# Patient Record
Sex: Male | Born: 1950
Health system: Southern US, Community
[De-identification: ages and names within clinical notes are randomized; demographics above are authoritative.]

## PROBLEM LIST (undated history)

## (undated) DIAGNOSIS — F419 Anxiety disorder, unspecified: Secondary | ICD-10-CM

## (undated) DIAGNOSIS — K635 Polyp of colon: Secondary | ICD-10-CM

## (undated) DIAGNOSIS — E1140 Type 2 diabetes mellitus with diabetic neuropathy, unspecified: Secondary | ICD-10-CM

## (undated) DIAGNOSIS — E119 Type 2 diabetes mellitus without complications: Secondary | ICD-10-CM

## (undated) DIAGNOSIS — E220 Acromegaly and pituitary gigantism: Secondary | ICD-10-CM

## (undated) DIAGNOSIS — M545 Low back pain, unspecified: Secondary | ICD-10-CM

## (undated) DIAGNOSIS — M419 Scoliosis, unspecified: Secondary | ICD-10-CM

## (undated) DIAGNOSIS — S82841A Displaced bimalleolar fracture of right lower leg, initial encounter for closed fracture: Secondary | ICD-10-CM

## (undated) DIAGNOSIS — Z800 Family history of malignant neoplasm of digestive organs: Secondary | ICD-10-CM

## (undated) DIAGNOSIS — E785 Hyperlipidemia, unspecified: Secondary | ICD-10-CM

## (undated) DIAGNOSIS — M5431 Sciatica, right side: Secondary | ICD-10-CM

## (undated) DIAGNOSIS — M47816 Spondylosis without myelopathy or radiculopathy, lumbar region: Secondary | ICD-10-CM

## (undated) DIAGNOSIS — D352 Benign neoplasm of pituitary gland: Secondary | ICD-10-CM

## (undated) DIAGNOSIS — K219 Gastro-esophageal reflux disease without esophagitis: Secondary | ICD-10-CM

## (undated) HISTORY — PX: ESOPHAGOGASTRODUODENOSCOPY: SHX1529

## (undated) HISTORY — DX: Polyp of colon: K63.5

## (undated) HISTORY — PX: FRACTURE SURGERY: SHX138

## (undated) HISTORY — PX: TONSILLECTOMY: SUR1361

## (undated) HISTORY — DX: Spondylosis without myelopathy or radiculopathy, lumbar region: M47.816

## (undated) HISTORY — PX: POLYPECTOMY: SHX149

## (undated) HISTORY — DX: Acromegaly and pituitary gigantism: E22.0

## (undated) HISTORY — PX: HEMORRHOID SURGERY: SHX153

## (undated) HISTORY — DX: Sciatica, right side: M54.31

## (undated) HISTORY — DX: Low back pain, unspecified: M54.50

## (undated) HISTORY — PX: UPPER GASTROINTESTINAL ENDOSCOPY: SHX188

## (undated) HISTORY — DX: Benign neoplasm of pituitary gland: D35.2

## (undated) HISTORY — DX: Gastro-esophageal reflux disease without esophagitis: K21.9

## (undated) HISTORY — DX: Gilbert syndrome: E80.4

## (undated) HISTORY — DX: Family history of malignant neoplasm of digestive organs: Z80.0

## (undated) HISTORY — DX: Type 2 diabetes mellitus with diabetic neuropathy, unspecified: E11.40

## (undated) HISTORY — DX: Scoliosis, unspecified: M41.9

## (undated) HISTORY — DX: Low back pain: M54.5

## (undated) SURGERY — OPEN REDUCTION INTERNAL FIXATION (ORIF) ANKLE FRACTURE
Anesthesia: Choice | Laterality: Right

---

## 2010-12-25 HISTORY — PX: COLONOSCOPY: SHX174

## 2011-11-13 LAB — HM COLONOSCOPY

## 2013-03-17 ENCOUNTER — Emergency Department (HOSPITAL_BASED_OUTPATIENT_CLINIC_OR_DEPARTMENT_OTHER): Payer: Worker's Compensation

## 2013-03-17 ENCOUNTER — Emergency Department (HOSPITAL_BASED_OUTPATIENT_CLINIC_OR_DEPARTMENT_OTHER)
Admission: EM | Admit: 2013-03-17 | Discharge: 2013-03-17 | Disposition: A | Discharge status: Home / Self Care | Payer: Worker's Compensation | Attending: Emergency Medicine | Admitting: Emergency Medicine

## 2013-03-17 ENCOUNTER — Encounter (HOSPITAL_BASED_OUTPATIENT_CLINIC_OR_DEPARTMENT_OTHER): Payer: Self-pay

## 2013-03-17 DIAGNOSIS — Z79899 Other long term (current) drug therapy: Secondary | ICD-10-CM | POA: Insufficient documentation

## 2013-03-17 DIAGNOSIS — IMO0002 Reserved for concepts with insufficient information to code with codable children: Secondary | ICD-10-CM | POA: Insufficient documentation

## 2013-03-17 DIAGNOSIS — S01309A Unspecified open wound of unspecified ear, initial encounter: Secondary | ICD-10-CM | POA: Insufficient documentation

## 2013-03-17 DIAGNOSIS — Y9289 Other specified places as the place of occurrence of the external cause: Secondary | ICD-10-CM | POA: Insufficient documentation

## 2013-03-17 DIAGNOSIS — R42 Dizziness and giddiness: Secondary | ICD-10-CM | POA: Insufficient documentation

## 2013-03-17 DIAGNOSIS — Z23 Encounter for immunization: Secondary | ICD-10-CM | POA: Insufficient documentation

## 2013-03-17 DIAGNOSIS — Z794 Long term (current) use of insulin: Secondary | ICD-10-CM | POA: Insufficient documentation

## 2013-03-17 DIAGNOSIS — Y990 Civilian activity done for income or pay: Secondary | ICD-10-CM | POA: Insufficient documentation

## 2013-03-17 DIAGNOSIS — E785 Hyperlipidemia, unspecified: Secondary | ICD-10-CM | POA: Insufficient documentation

## 2013-03-17 DIAGNOSIS — Z8669 Personal history of other diseases of the nervous system and sense organs: Secondary | ICD-10-CM | POA: Insufficient documentation

## 2013-03-17 DIAGNOSIS — S0990XA Unspecified injury of head, initial encounter: Secondary | ICD-10-CM | POA: Insufficient documentation

## 2013-03-17 DIAGNOSIS — R51 Headache: Secondary | ICD-10-CM | POA: Insufficient documentation

## 2013-03-17 DIAGNOSIS — X58XXXA Exposure to other specified factors, initial encounter: Secondary | ICD-10-CM | POA: Insufficient documentation

## 2013-03-17 DIAGNOSIS — Y9389 Activity, other specified: Secondary | ICD-10-CM | POA: Insufficient documentation

## 2013-03-17 DIAGNOSIS — Z87828 Personal history of other (healed) physical injury and trauma: Secondary | ICD-10-CM | POA: Insufficient documentation

## 2013-03-17 HISTORY — DX: Hyperlipidemia, unspecified: E78.5

## 2013-03-17 MED ORDER — TETANUS-DIPHTH-ACELL PERTUSSIS 5-2.5-18.5 LF-MCG/0.5 IM SUSP
0.5000 mL | Freq: Once | INTRAMUSCULAR | Status: AC
  Administered 2013-03-17: 0.5 mL via INTRAMUSCULAR
  Filled 2013-03-17: qty 0.5

## 2013-03-17 MED ORDER — MORPHINE SULFATE 4 MG/ML IJ SOLN
6.0000 mg | Freq: Once | INTRAMUSCULAR | Status: AC
  Administered 2013-03-17: 4 mg via INTRAMUSCULAR
  Filled 2013-03-17: qty 1

## 2013-03-17 MED ORDER — ONDANSETRON 4 MG PO TBDP
4.0000 mg | ORAL_TABLET | Freq: Once | ORAL | Status: AC
  Administered 2013-03-17: 4 mg via ORAL
  Filled 2013-03-17 (×2): qty 1

## 2013-03-17 MED ORDER — HYDROCODONE-ACETAMINOPHEN 5-325 MG PO TABS: ORAL_TABLET | ORAL | Status: DC

## 2013-03-17 NOTE — ED Notes (Signed)
Pt states that he hit in the head with a car hood in the area of the mastoid process, dizziness, headache, no LOC.  Ringing in the ears.

## 2013-03-17 NOTE — ED Provider Notes (Signed)
History     CSN: [id number]  Arrival date & time 03/17/13  1815   First MD Initiated Contact with Patient 03/17/13 2105      Chief Complaint  Patient presents with  . Head Injury    (Consider location/radiation/quality/duration/timing/severity/associated sxs/prior treatment) HPI  William Webster is a 62 y.o. male complaining of pain to inferior, poasterior right ear after trauma earlier in the day. Patient was throwing a car hood at work and one of the lids impacted him behind the right ear. He denies LOC, nausea vomiting, he does endorse a generalized headache rated at moderate. Patient had trauma to the scene area several weeks ago. Patient was seen and evaluated for this at urgent care there is a laceration behind the left ear he was instructed to come to the ED for further evaluation and is bleeding could not be controlled. Patient had a similar injury 2 weeks ago to the same area while moving car hoods. Patient denies change in vision, numbness/weakness, dysarthria. Patient states that he feels "out of it," according to his wife he is mentating at his baseline.  Past Medical History  Diagnosis Date  . Hyperlipidemia   . Post concussive syndrome     Past Surgical History  Procedure Laterality Date  . Hemorrhoid surgery      History reviewed. No pertinent family history.  History  Substance Use Topics  . Smoking status: Never Smoker   . Smokeless tobacco: Never Used  . Alcohol Use: Yes     Comment: social      Review of Systems  Constitutional: Negative for fever.  Respiratory: Negative for shortness of breath.   Cardiovascular: Negative for chest pain.  Gastrointestinal: Negative for nausea, vomiting, abdominal pain and diarrhea.  Skin: Positive for wound.  Neurological: Positive for headaches.  All other systems reviewed and are negative.    Allergies  Review of patient's allergies indicates no known allergies.  Home Medications   Current Outpatient  Rx  Name  Route  Sig  Dispense  Refill  . exenatide (BYETTA 10 MCG PEN) 10 MCG/0.04ML SOLN   Subcutaneous   Inject 10 mcg into the skin 2 (two) times daily with a meal.         . fish oil-omega-3 fatty acids 1000 MG capsule   Oral   Take 1 g by mouth daily.         . insulin detemir (LEVEMIR) 100 UNIT/ML injection   Subcutaneous   Inject 52 Units into the skin daily.         . metoCLOPramide (REGLAN) 10 MG tablet   Oral   Take 10 mg by mouth 4 (four) times daily.         . pioglitazone (ACTOS) 15 MG tablet   Oral   Take 15 mg by mouth daily.         . rosuvastatin (CRESTOR) 40 MG tablet   Oral   Take 40 mg by mouth daily.           BP 149/68  Pulse 51  Temp(Src) 97.9 F (36.6 C) (Oral)  Resp 18  Ht 6' (1.829 m)  Wt 275 lb (124.739 kg)  BMI 37.29 kg/m2  SpO2 100%  Physical Exam  Nursing note and vitals reviewed. Constitutional: He is oriented to person, place, and time. He appears well-developed and well-nourished. No distress.  HENT:  Head: Normocephalic and atraumatic.  Mouth/Throat: Oropharynx is clear and moist.  Left tympanic membrane is normal architecture with good  light reflex.  Full range of motion to TMJ, jaw opening produces pain.  Eyes: Conjunctivae and EOM are normal.  Neck: Normal range of motion.  No midline tenderness to palpation or step-offs.  Cardiovascular: Normal rate, regular rhythm and intact distal pulses.   Pulmonary/Chest: Effort normal and breath sounds normal. No stridor.  Abdominal: Soft.  Musculoskeletal: Normal range of motion.  Neurological: He is alert and oriented to person, place, and time.  Cranial nerves III through XII intact, strength 5 out of 5x4 extremities, negative pronator drift, finger to nose and heel-to-shin coordinated, sensation intact to pinprick and light touch, gait is coordinated and Romberg is negative.   Skin:  3 cm partial to full thickness non-jagged laceration to posterior left ear.   Psychiatric: He has a normal mood and affect.    ED Course  Procedures (including critical care time)  LACERATION REPAIR Performed by: Wynetta Emery Authorized by: Wynetta Emery Consent: Verbal consent obtained. Risks and benefits: risks, benefits and alternatives were discussed Consent given by: patient Patient identity confirmed: Wrist band  Prepped and Draped in normal sterile fashion  Tetanus: Tdap Updated  Laceration Location: Posterior right ear  Laceration Length: 3 cm  Anesthesia: None    Irrigation method: syringe  Amount of cleaning: copious   Wound explored to depth in good light on a bloodless field with no foreign bodies seen or palpated.   Skin closure: Dermabond in 3 layers   Patient tolerance: Patient tolerated the procedure well with no immediate complications.   Instructions for care discussed verbally and patient provided with additional written instructions for homecare and f/u.  Labs Reviewed - No data to display Dg Mandible 4 Views  03/17/2013  *RADIOLOGY REPORT*  Clinical Data: Head injury.  Laceration in the right ear.  MANDIBLE - 4+ VIEW  Comparison: CT head 03/17/2013.  Findings: No displaced facial fracture is identified.  Soft tissue density overlies the medial maxillary sinuses, which appears to be due to nasal cartilage.  Mastoid air cells clear.  The mandible appears intact.  Mandibular condyles are located.  There is some bony overlap on the oblique views however no displaced fracture is identified.  If there is high clinical suspicion, consider facial CT.  Zygomatic arches appear intact.  IMPRESSION: No displaced facial fracture identified.   Original Report Authenticated By: Andreas Newport, M.D.    Ct Head Wo Contrast  03/17/2013  *RADIOLOGY REPORT*  Clinical Data: Head injury.  Headache.  Dizziness.  CT HEAD WITHOUT CONTRAST  Technique:  Contiguous axial images were obtained from the base of the skull through the vertex without  contrast.  Comparison: None.  Findings: There is no mass effect, midline shift, or acute intracranial hemorrhage.  Minimal global atrophy.  Small amount of fat is seen to the right of midline at the confluence of the falx and tentorium likely a small lipoma.  Mastoid air cells and visualized paranasal sinuses are clear.  Cranium is intact.  IMPRESSION: No acute intracranial pathology.  Chronic changes are noted.   Original Report Authenticated By: Jolaine Click, M.D.      1. Head trauma, initial encounter   2. Laceration       MDM   With mild head trauma and laceration. Normal neuro exam. CT shows no abnormalities. The laceration closed with Dermabond without incident. I have advised the patient that he needs to be cleared by his primary care doctor before returning to work and physically strenuous activity. Patient and his  wife voiced  their understanding.  New Prescriptions   HYDROCODONE-ACETAMINOPHEN (NORCO/VICODIN) 5-325 MG PER TABLET    Take 1-2 tablets by mouth every 6 hours as needed for pain.           Wynetta Emery, PA-C 03/17/13 [phone]

## 2013-03-17 NOTE — ED Provider Notes (Signed)
Medical screening examination/treatment/procedure(s) were performed by non-physician practitioner and as supervising physician I was immediately available for consultation/collaboration.  Ethelda Chick, MD 03/17/13 2351

## 2014-01-05 DIAGNOSIS — E785 Hyperlipidemia, unspecified: Secondary | ICD-10-CM | POA: Insufficient documentation

## 2014-01-05 DIAGNOSIS — E119 Type 2 diabetes mellitus without complications: Secondary | ICD-10-CM | POA: Insufficient documentation

## 2014-09-10 LAB — LIPID PANEL
Cholesterol: 162 mg/dL (ref 0–200)
Cholesterol: 162 mg/dL (ref 0–200)
HDL: 71 mg/dL — AB (ref 35–70)
LDL CALC: 72 mg/dL
Triglycerides: 96 mg/dL (ref 40–160)

## 2014-09-10 LAB — BASIC METABOLIC PANEL
BUN: 22 mg/dL — AB (ref 4–21)
Creatinine: 1 mg/dL (ref 0.6–1.3)
Glucose: 175 mg/dL
Potassium: 5.1 mmol/L (ref 3.4–5.3)
Sodium: 138 mmol/L (ref 137–147)

## 2014-09-10 LAB — COMPLETE METABOLIC PANEL WITH GFR
Albumin: 4.5
Anion gap: 8
CALCIUM: 9.7
Carbon Dioxide, Total: 27
Chloride: 103 mmol/L
EGFR (Non-African Amer.): 80.65
GLOBULIN: 2.2
TOTAL PROTEIN, FLUID: 6.7

## 2014-09-10 LAB — HEMOGLOBIN A1C: Hgb A1c MFr Bld: 6.3 % — AB (ref 4.0–6.0)

## 2014-09-10 LAB — HEPATIC FUNCTION PANEL
ALT: 21 U/L (ref 10–40)
AST: 15 U/L (ref 14–40)
Alkaline Phosphatase: 51 U/L (ref 25–125)
BILIRUBIN, TOTAL: 1.7 mg/dL

## 2014-09-22 ENCOUNTER — Emergency Department (INDEPENDENT_AMBULATORY_CARE_PROVIDER_SITE_OTHER)
Admission: EM | Admit: 2014-09-22 | Discharge: 2014-09-22 | Disposition: A | Discharge status: Home / Self Care | Payer: Managed Care, Other (non HMO) | Source: Home / Self Care

## 2014-09-22 ENCOUNTER — Encounter: Payer: Self-pay | Admitting: Emergency Medicine

## 2014-09-22 DIAGNOSIS — M25531 Pain in right wrist: Secondary | ICD-10-CM

## 2014-09-22 DIAGNOSIS — J069 Acute upper respiratory infection, unspecified: Secondary | ICD-10-CM

## 2014-09-22 DIAGNOSIS — J029 Acute pharyngitis, unspecified: Secondary | ICD-10-CM

## 2014-09-22 DIAGNOSIS — R319 Hematuria, unspecified: Secondary | ICD-10-CM

## 2014-09-22 DIAGNOSIS — R509 Fever, unspecified: Secondary | ICD-10-CM

## 2014-09-22 HISTORY — DX: Type 2 diabetes mellitus without complications: E11.9

## 2014-09-22 LAB — POCT URINALYSIS DIP (MANUAL ENTRY)
BILIRUBIN UA: NEGATIVE
Glucose, UA: NEGATIVE
Ketones, POC UA: NEGATIVE
LEUKOCYTES UA: NEGATIVE
NITRITE UA: NEGATIVE
PH UA: 5.5 (ref 5–8)
PROTEIN UA: NEGATIVE
Spec Grav, UA: 1.02 (ref 1.005–1.03)
UROBILINOGEN UA: 0.2 (ref 0–1)

## 2014-09-22 LAB — POCT RAPID STREP A (OFFICE): RAPID STREP A SCREEN: NEGATIVE

## 2014-09-22 MED ORDER — IBUPROFEN-FAMOTIDINE 800-26.6 MG PO TABS: ORAL_TABLET | ORAL | Status: DC

## 2014-09-22 MED ORDER — KETOROLAC TROMETHAMINE 60 MG/2ML IM SOLN
60.0000 mg | Freq: Once | INTRAMUSCULAR | Status: AC
  Administered 2014-09-22: 60 mg via INTRAMUSCULAR

## 2014-09-22 NOTE — ED Notes (Signed)
Pt c/o fever (100) and chills x last night. Denies dysuria or URI s/s.

## 2014-09-22 NOTE — Discharge Instructions (Signed)
Tendonitis or arthritis Given duexis to take up to three times a day for joint pain and generalized body aches.   Upper Respiratory Infection, Adult An upper respiratory infection (URI) is also known as the common cold. It is often caused by a type of germ (virus). Colds are easily spread (contagious). You can pass it to others by kissing, coughing, sneezing, or drinking out of the same glass. Usually, you get better in 1 or 2 weeks.  HOME CARE   Only take medicine as told by your doctor.  Use a warm mist humidifier or breathe in steam from a hot shower.  Drink enough water and fluids to keep your pee (urine) clear or pale yellow.  Get plenty of rest.  Return to work when your temperature is back to normal or as told by your doctor. You may use a face mask and wash your hands to stop your cold from spreading. GET HELP RIGHT AWAY IF:   After the first few days, you feel you are getting worse.  You have questions about your medicine.  You have chills, shortness of breath, or brown or red spit (mucus).  You have yellow or brown snot (nasal discharge) or pain in the face, especially when you bend forward.  You have a fever, puffy (swollen) neck, pain when you swallow, or white spots in the back of your throat.  You have a bad headache, ear pain, sinus pain, or chest pain.  You have a high-pitched whistling sound when you breathe in and out (wheezing).  You have a lasting cough or cough up blood.  You have sore muscles or a stiff neck. MAKE SURE YOU:   Understand these instructions.  Will watch your condition.  Will get help right away if you are not doing well or get worse. Document Released: 05/29/2008 Document Revised: 03/04/2012 Document Reviewed: 03/18/2014 [facility] Patient Information 2015 [place], [place]. This information is not intended to replace advice given to you by your health care provider. Make sure you discuss any questions you have with your health care  provider.

## 2014-09-22 NOTE — ED Provider Notes (Signed)
CSN: [id number]     Arrival date & time 09/22/14  1036 History   None    Chief Complaint  Patient presents with  . Fever  . Chills   (Consider location/radiation/quality/duration/timing/severity/associated sxs/prior Treatment) HPI  Past Medical History  Diagnosis Date  . Hyperlipidemia   . Post concussive syndrome   . Diabetes mellitus without complication    Past Surgical History  Procedure Laterality Date  . Hemorrhoid surgery    . Tonsillectomy     Family History  Problem Relation Age of Onset  . Cancer Mother     colon  . Heart failure Mother   . Diabetes Mother   . Stroke Father    History  Substance Use Topics  . Smoking status: Current Some Day Smoker    Types: Cigars  . Smokeless tobacco: Never Used     Comment: occassional cigar  . Alcohol Use: Yes     Comment: social    Review of Systems  All other systems reviewed and are negative.   Allergies  Review of patient's allergies indicates no known allergies.  Home Medications   Prior to Admission medications   Medication Sig Start Date End Date Taking? Authorizing Provider  exenatide (BYETTA 10 MCG PEN) 10 MCG/0.04ML SOLN Inject 10 mcg into the skin 2 (two) times daily with a meal.    Historical Provider, MD  fish oil-omega-3 fatty acids 1000 MG capsule Take 1 g by mouth daily.    Historical Provider, MD  HYDROcodone-acetaminophen (NORCO/VICODIN) 5-325 MG per tablet Take 1-2 tablets by mouth every 6 hours as needed for pain. 03/17/13   Nicole Pisciotta, PA-C  Ibuprofen-Famotidine 800-26.6 MG TABS Take one tablet up to three times a day. 09/22/14   Natassja Ollis L Pegeen Stiger, PA-C  insulin detemir (LEVEMIR) 100 UNIT/ML injection Inject 52 Units into the skin daily.    Historical Provider, MD  metoCLOPramide (REGLAN) 10 MG tablet Take 10 mg by mouth 4 (four) times daily.    Historical Provider, MD  pioglitazone (ACTOS) 15 MG tablet Take 15 mg by mouth daily.    Historical Provider, MD  rosuvastatin (CRESTOR) 40 MG  tablet Take 40 mg by mouth daily.    Historical Provider, MD   BP 120/66  Pulse 55  Temp(Src) 98.2 F (36.8 C) (Oral)  Resp 18  Ht 6' (1.829 m)  Wt 257 lb (116.574 kg)  BMI 34.85 kg/m2  SpO2 97% Physical Exam  Constitutional: He is oriented to person, place, and time. He appears well-developed and well-nourished.  HENT:  Head: Normocephalic and atraumatic.  Right Ear: External ear normal.  Left Ear: External ear normal.  Nose: Nose normal.  Mouth/Throat: Oropharynx is clear and moist. No oropharyngeal exudate.  Eyes: Conjunctivae are normal. Left eye exhibits no discharge.  Neck: Normal range of motion. Neck supple.  Tender swollen anterior lymph nodes.   Cardiovascular: Normal rate, regular rhythm and normal heart sounds.   Pulmonary/Chest: Effort normal and breath sounds normal. He has no wheezes.  No CVA tenderness.   Abdominal: Soft. Bowel sounds are normal. He exhibits no mass.  Musculoskeletal:  Right wrist-  NROM.  Negative finklestein.  Hand grip 5/5.  Pain to palpation around ulnar head.  No swelling or bruising.   Lymphadenopathy:    He has cervical adenopathy.  Neurological: He is alert and oriented to person, place, and time.  Skin: Skin is dry.  Psychiatric: He has a normal mood and affect. His behavior is normal.    ED Course  Procedures (including critical care time) Labs Review Labs Reviewed  URINE CULTURE  POCT RAPID STREP A (OFFICE)  POCT URINALYSIS DIP (MANUAL ENTRY)    Imaging Review No results found.   MDM   1. URI (upper respiratory infection)   2. Right wrist pain   3. Sore throat   4. Fever, unspecified   5. Blood in urine    Rapid strep negative.  UA dipstick negative for everything except trace blood.  Discussed with patient I do feel like infection is viral.  HO of symptomatic care given.  If symptoms worsen or longer in duration could consider abx.  Reassured pt that I do not feel like this could be reaction from flu shot  on 09/10/14.  For wrist pain offered xrays. Pt declined.  Appears to be tendonitis vs osteoarthritis.  Rest. Offered wrist brace for immobility pt declined. Pt has problems tolerating NSAIDs with his stomach.  Given duexis to take up to three times a day.  Follow up with Dr. Sophronia Simas as needed if pain not improving.     Follow up with PCP in next month to have urine rechecked since blood today. Will culture urine today.     Donella Stade, PA-C 09/22/14 1240

## 2014-09-23 LAB — URINE CULTURE
Colony Count: NO GROWTH
Organism ID, Bacteria: NO GROWTH

## 2014-09-28 ENCOUNTER — Telehealth: Payer: Self-pay | Admitting: *Deleted

## 2014-10-15 NOTE — ED Provider Notes (Signed)
Agree with exam, assessment, and plan.   Kandra Nicolas, MD 10/15/14 [phone]

## 2014-11-30 ENCOUNTER — Emergency Department (INDEPENDENT_AMBULATORY_CARE_PROVIDER_SITE_OTHER)
Admission: EM | Admit: 2014-11-30 | Discharge: 2014-11-30 | Disposition: A | Discharge status: Home / Self Care | Payer: Managed Care, Other (non HMO) | Source: Home / Self Care | Attending: Family Medicine | Admitting: Family Medicine

## 2014-11-30 ENCOUNTER — Emergency Department (INDEPENDENT_AMBULATORY_CARE_PROVIDER_SITE_OTHER): Payer: Managed Care, Other (non HMO)

## 2014-11-30 ENCOUNTER — Encounter: Payer: Self-pay | Admitting: *Deleted

## 2014-11-30 DIAGNOSIS — R05 Cough: Secondary | ICD-10-CM

## 2014-11-30 DIAGNOSIS — R0989 Other specified symptoms and signs involving the circulatory and respiratory systems: Secondary | ICD-10-CM

## 2014-11-30 DIAGNOSIS — J209 Acute bronchitis, unspecified: Secondary | ICD-10-CM

## 2014-11-30 DIAGNOSIS — R059 Cough, unspecified: Secondary | ICD-10-CM

## 2014-11-30 MED ORDER — GUAIFENESIN-CODEINE 100-10 MG/5ML PO SOLN: 5.0000 mL | Freq: Every evening | ORAL | Status: DC | PRN

## 2014-11-30 MED ORDER — PREDNISONE 10 MG PO TABS: 30.0000 mg | ORAL_TABLET | Freq: Every day | ORAL | Status: DC

## 2014-11-30 MED ORDER — IPRATROPIUM BROMIDE 0.06 % NA SOLN: 2.0000 | Freq: Four times a day (QID) | NASAL | Status: DC

## 2014-11-30 MED ORDER — AZITHROMYCIN 250 MG PO TABS: 250.0000 mg | ORAL_TABLET | Freq: Every day | ORAL | Status: DC

## 2014-11-30 NOTE — ED Notes (Signed)
Gianlucas c/o cough, congestion, dizziness and runny nose x 1+ week.

## 2014-11-30 NOTE — Discharge Instructions (Signed)
Thank you for coming in today. Prednisone and azithromycin daily for 5 days. Use Atrovent nasal spray as needed. Use codeine containing cough medication as needed. Do not drive after taking this medication. Call or go to the emergency room if you get worse, have trouble breathing, have chest pains, or palpitations.    Cough, Adult  A cough is a reflex that helps clear your throat and airways. It can help heal the body or may be a reaction to an irritated airway. A cough may only last 2 or 3 weeks (acute) or may last more than 8 weeks (chronic).  CAUSES Acute cough:  Viral or bacterial infections. Chronic cough:  Infections.  Allergies.  Asthma.  Post-nasal drip.  Smoking.  Heartburn or acid reflux.  Some medicines.  Chronic lung problems (COPD).  Cancer. SYMPTOMS   Cough.  Fever.  Chest pain.  Increased breathing rate.  High-pitched whistling sound when breathing (wheezing).  Colored mucus that you cough up (sputum). TREATMENT   A bacterial cough may be treated with antibiotic medicine.  A viral cough must run its course and will not respond to antibiotics.  Your caregiver may recommend other treatments if you have a chronic cough. HOME CARE INSTRUCTIONS   Only take over-the-counter or prescription medicines for pain, discomfort, or fever as directed by your caregiver. Use cough suppressants only as directed by your caregiver.  Use a cold steam vaporizer or humidifier in your bedroom or home to help loosen secretions.  Sleep in a semi-upright position if your cough is worse at night.  Rest as needed.  Stop smoking if you smoke. SEEK IMMEDIATE MEDICAL CARE IF:   You have pus in your sputum.  Your cough starts to worsen.  You cannot control your cough with suppressants and are losing sleep.  You begin coughing up blood.  You have difficulty breathing.  You develop pain which is getting worse or is uncontrolled with medicine.  You have a  fever. MAKE SURE YOU:   Understand these instructions.  Will watch your condition.  Will get help right away if you are not doing well or get worse. Document Released: 06/09/2011 Document Revised: 03/04/2012 Document Reviewed: 06/09/2011 [facility] Patient Information 2015 [place], [place]. This information is not intended to replace advice given to you by your health care provider. Make sure you discuss any questions you have with your health care provider.   Acute Bronchitis Bronchitis is inflammation of the airways that extend from the windpipe into the lungs (bronchi). The inflammation often causes mucus to develop. This leads to a cough, which is the most common symptom of bronchitis.  In acute bronchitis, the condition usually develops suddenly and goes away over time, usually in a couple weeks. Smoking, allergies, and asthma can make bronchitis worse. Repeated episodes of bronchitis may cause further lung problems.  CAUSES Acute bronchitis is most often caused by the same virus that causes a cold. The virus can spread from person to person (contagious) through coughing, sneezing, and touching contaminated objects. SIGNS AND SYMPTOMS   Cough.   Fever.   Coughing up mucus.   Body aches.   Chest congestion.   Chills.   Shortness of breath.   Sore throat.  DIAGNOSIS  Acute bronchitis is usually diagnosed through a physical exam. Your health care provider will also ask you questions about your medical history. Tests, such as chest X-rays, are sometimes done to rule out other conditions.  TREATMENT  Acute bronchitis usually goes away in a couple  weeks. Oftentimes, no medical treatment is necessary. Medicines are sometimes given for relief of fever or cough. Antibiotic medicines are usually not needed but may be prescribed in certain situations. In some cases, an inhaler may be recommended to help reduce shortness of breath and control the cough. A cool mist vaporizer may  also be used to help thin bronchial secretions and make it easier to clear the chest.  HOME CARE INSTRUCTIONS  Get plenty of rest.   Drink enough fluids to keep your urine clear or pale yellow (unless you have a medical condition that requires fluid restriction). Increasing fluids may help thin your respiratory secretions (sputum) and reduce chest congestion, and it will prevent dehydration.   Take medicines only as directed by your health care provider.  If you were prescribed an antibiotic medicine, finish it all even if you start to feel better.  Avoid smoking and secondhand smoke. Exposure to cigarette smoke or irritating chemicals will make bronchitis worse. If you are a smoker, consider using nicotine gum or skin patches to help control withdrawal symptoms. Quitting smoking will help your lungs heal faster.   Reduce the chances of another bout of acute bronchitis by washing your hands frequently, avoiding people with cold symptoms, and trying not to touch your hands to your mouth, nose, or eyes.   Keep all follow-up visits as directed by your health care provider.  SEEK MEDICAL CARE IF: Your symptoms do not improve after 1 week of treatment.  SEEK IMMEDIATE MEDICAL CARE IF:  You develop an increased fever or chills.   You have chest pain.   You have severe shortness of breath.  You have bloody sputum.   You develop dehydration.  You faint or repeatedly feel like you are going to pass out.  You develop repeated vomiting.  You develop a severe headache. MAKE SURE YOU:   Understand these instructions.  Will watch your condition.  Will get help right away if you are not doing well or get worse. Document Released: 01/18/2005 Document Revised: 04/27/2014 Document Reviewed: 06/03/2013 [facility] Patient Information 2015 [place], [place]. This information is not intended to replace advice given to you by your health care provider. Make sure you discuss any questions you  have with your health care provider.

## 2014-11-30 NOTE — ED Provider Notes (Signed)
William Webster is a 63 y.o. male who presents to Urgent Care today for cough congestion sore throat. Patient edition is a postnasal drip. The cough is persistent now for several weeks. He notes some mild dizziness with significant coughing. No vomiting diarrhea shortness of breath. No chest pains or palpitations. He feels well otherwise. He has tried some over-the-counter medications which helped a little.   Past Medical History  Diagnosis Date  . Hyperlipidemia   . Post concussive syndrome   . Diabetes mellitus without complication    Past Surgical History  Procedure Laterality Date  . Hemorrhoid surgery    . Tonsillectomy     History  Substance Use Topics  . Smoking status: Current Some Day Smoker    Types: Cigars  . Smokeless tobacco: Never Used     Comment: occassional cigar  . Alcohol Use: Yes     Comment: social   ROS as above Medications: No current facility-administered medications for this encounter.   Current Outpatient Prescriptions  Medication Sig Dispense Refill  . azithromycin (ZITHROMAX) 250 MG tablet Take 1 tablet (250 mg total) by mouth daily. Take first 2 tablets together, then 1 every day until finished. 6 tablet 0  . exenatide (BYETTA 10 MCG PEN) 10 MCG/0.04ML SOLN Inject 10 mcg into the skin 2 (two) times daily with a meal.    . fish oil-omega-3 fatty acids 1000 MG capsule Take 1 g by mouth daily.    Marland Kitchen guaiFENesin-codeine 100-10 MG/5ML syrup Take 5 mLs by mouth at bedtime as needed for cough. 120 mL 0  . HYDROcodone-acetaminophen (NORCO/VICODIN) 5-325 MG per tablet Take 1-2 tablets by mouth every 6 hours as needed for pain. 15 tablet 0  . Ibuprofen-Famotidine 800-26.6 MG TABS Take one tablet up to three times a day. 90 tablet 1  . insulin detemir (LEVEMIR) 100 UNIT/ML injection Inject 52 Units into the skin daily.    Marland Kitchen ipratropium (ATROVENT) 0.06 % nasal spray Place 2 sprays into both nostrils 4 (four) times daily. 15 mL 1  . metoCLOPramide (REGLAN) 10  MG tablet Take 10 mg by mouth 4 (four) times daily.    . pioglitazone (ACTOS) 15 MG tablet Take 15 mg by mouth daily.    . predniSONE (DELTASONE) 10 MG tablet Take 3 tablets (30 mg total) by mouth daily. 15 tablet 0  . rosuvastatin (CRESTOR) 40 MG tablet Take 40 mg by mouth daily.     No Known Allergies   Exam:  BP 143/73 mmHg  Pulse 55  Temp(Src) 97.4 F (36.3 C) (Oral)  Resp 16  Wt 262 lb (118.842 kg)  SpO2 98% Gen: Well NAD HEENT: EOMI,  MMM is here for cobblestoning. Normal tympanic membranes bilaterally. Lungs: Normal work of breathing. CTABL Heart: RRR no MRG Abd: NABS, Soft. Nondistended, Nontender Exts: Brisk capillary refill, warm and well perfused.   No results found for this or any previous visit (from the past 24 hour(s)). Dg Chest 2 View  11/30/2014   CLINICAL DATA:  Cough and chest congestion.  EXAM: CHEST  2 VIEW  COMPARISON:  12/23/2010  FINDINGS: There is slight peribronchial thickening. The lungs are otherwise clear. No infiltrates or effusions. Heart size and vascularity are normal. No significant osseous abnormality.  IMPRESSION: Minimal bronchitic changes.   Electronically Signed   By: Rozetta Nunnery M.D.   On: 11/30/2014 12:44    Assessment and Plan: 63 y.o. male with bronchitis. Treatment with prednisone and azithromycin Atrovent nasal spray and codeine containing cough medication.  Discussed risks of prednisone including hyperglycemia. Follow-up as needed.  Discussed warning signs or symptoms. Please see discharge instructions. Patient expresses understanding.     Gregor Hams, MD 11/30/14 [phone]

## 2015-01-04 ENCOUNTER — Emergency Department (INDEPENDENT_AMBULATORY_CARE_PROVIDER_SITE_OTHER): Payer: Managed Care, Other (non HMO)

## 2015-01-04 ENCOUNTER — Emergency Department (INDEPENDENT_AMBULATORY_CARE_PROVIDER_SITE_OTHER)
Admission: EM | Admit: 2015-01-04 | Discharge: 2015-01-04 | Disposition: A | Discharge status: Home / Self Care | Payer: Managed Care, Other (non HMO) | Source: Home / Self Care | Attending: Family Medicine | Admitting: Family Medicine

## 2015-01-04 ENCOUNTER — Encounter: Payer: Self-pay | Admitting: Emergency Medicine

## 2015-01-04 DIAGNOSIS — M5080 Other cervical disc disorders, unspecified cervical region: Secondary | ICD-10-CM

## 2015-01-04 DIAGNOSIS — M5412 Radiculopathy, cervical region: Secondary | ICD-10-CM

## 2015-01-04 DIAGNOSIS — M542 Cervicalgia: Secondary | ICD-10-CM

## 2015-01-04 MED ORDER — TRAMADOL HCL 50 MG PO TABS: 50.0000 mg | ORAL_TABLET | Freq: Every evening | ORAL | Status: DC | PRN

## 2015-01-04 MED ORDER — PREDNISONE 20 MG PO TABS: 20.0000 mg | ORAL_TABLET | Freq: Two times a day (BID) | ORAL | Status: DC

## 2015-01-04 NOTE — Discharge Instructions (Signed)
Apply ice pack for 20 to 30 minutes, 3 to 4 times daily  Continue until pain decreases.  Avoid lifting.   Cervical Radiculopathy Cervical radiculopathy happens when a nerve in the neck is pinched or bruised by a slipped (herniated) disk or by arthritic changes in the bones of the cervical spine. This can occur due to an injury or as part of the normal aging process. Pressure on the cervical nerves can cause pain or numbness that runs from your neck all the way down into your arm and fingers. CAUSES  There are many possible causes, including:  Injury.  Muscle tightness in the neck from overuse.  Swollen, painful joints (arthritis).  Breakdown or degeneration in the bones and joints of the spine (spondylosis) due to aging.  Bone spurs that may develop near the cervical nerves. SYMPTOMS  Symptoms include pain, weakness, or numbness in the affected arm and hand. Pain can be severe or irritating. Symptoms may be worse when extending or turning the neck. DIAGNOSIS  Your caregiver will ask about your symptoms and do a physical exam. He or she may test your strength and reflexes. X-rays, CT scans, and MRI scans may be needed in cases of injury or if the symptoms do not go away after a period of time. Electromyography (EMG) or nerve conduction testing may be done to study how your nerves and muscles are working. TREATMENT  Your caregiver may recommend certain exercises to help relieve your symptoms. Cervical radiculopathy can, and often does, get better with time and treatment. If your problems continue, treatment options may include:  Wearing a soft collar for short periods of time.  Physical therapy to strengthen the neck muscles.  Medicines, such as nonsteroidal anti-inflammatory drugs (NSAIDs), oral corticosteroids, or spinal injections.  Surgery. Different types of surgery may be done depending on the cause of your problems. HOME CARE INSTRUCTIONS   Put ice on the affected area.  Put  ice in a plastic bag.  Place a towel between your skin and the bag.  Leave the ice on for 15-20 minutes, 03-04 times a day or as directed by your caregiver.  If ice does not help, you can try using heat. Take a warm shower or bath, or use a hot water bottle as directed by your caregiver.  You may try a gentle neck and shoulder massage.  Use a flat pillow when you sleep.  Only take over-the-counter or prescription medicines for pain, discomfort, or fever as directed by your caregiver.  If physical therapy was prescribed, follow your caregiver's directions.  If a soft collar was prescribed, use it as directed. SEEK IMMEDIATE MEDICAL CARE IF:   Your pain gets much worse and cannot be controlled with medicines.  You have weakness or numbness in your hand, arm, face, or leg.  You have a high fever or a stiff, rigid neck.  You lose bowel or bladder control (incontinence).  You have trouble with walking, balance, or speaking. MAKE SURE YOU:   Understand these instructions.  Will watch your condition.  Will get help right away if you are not doing well or get worse. Document Released: 09/05/2001 Document Revised: 03/04/2012 Document Reviewed: 07/25/2011 [facility] Patient Information 2015 [place], [place]. This information is not intended to replace advice given to you by your health care provider. Make sure you discuss any questions you have with your health care provider.

## 2015-01-04 NOTE — ED Provider Notes (Signed)
CSN: [id number]     Arrival date & time 01/04/15  1602 History   First MD Initiated Contact with Patient 01/04/15 1719     Chief Complaint  Patient presents with  . Shoulder Pain      HPI Comments: Patient complains of onset of pain in his right neck about two weeks ago without a history of injury.  The pain now radiates into his right shoulder and right upper back to his scapula.  The pain is worse at night and awakens him.  Over the past several days, he has developed intermittent tingling paresthesias in his right arm/hand/fingers with certain movements of his neck.  The paresthesias tend to occur when he flexes his neck laterally to the right, and rotates his head to the right.  Patient continues to smoke.  Patient is a 64 y.o. male presenting with shoulder pain. The history is provided by the patient.  Shoulder Pain Location:  Shoulder and arm Time since incident:  2 weeks Shoulder location:  R shoulder Arm location:  R arm Pain details:    Quality:  Shooting and aching   Radiates to:  R arm and R fingers   Severity:  Moderate   Onset quality:  Gradual   Duration:  2 weeks   Timing:  Intermittent   Progression:  Worsening Chronicity:  New Handedness:  Right-handed Prior injury to area:  No Relieved by:  Nothing Exacerbated by: movement of neck. Ineffective treatments:  NSAIDs Associated symptoms: back pain, neck pain, numbness and tingling   Associated symptoms: no decreased range of motion, no fatigue, no fever, no muscle weakness, no stiffness and no swelling     Past Medical History  Diagnosis Date  . Hyperlipidemia   . Post concussive syndrome   . Diabetes mellitus without complication    Past Surgical History  Procedure Laterality Date  . Hemorrhoid surgery    . Tonsillectomy     Family History  Problem Relation Age of Onset  . Cancer Mother     colon  . Heart failure Mother   . Diabetes Mother   . Stroke Father    History  Substance Use Topics  .  Smoking status: Current Some Day Smoker    Types: Cigars  . Smokeless tobacco: Never Used     Comment: occassional cigar  . Alcohol Use: Yes     Comment: social    Review of Systems  Constitutional: Negative for fever and fatigue.  HENT: Negative.   Eyes: Negative.   Respiratory: Negative for cough and shortness of breath.   Cardiovascular: Negative.   Gastrointestinal: Negative.   Genitourinary: Negative.   Musculoskeletal: Positive for back pain and neck pain. Negative for stiffness.  Neurological: Positive for numbness.    Allergies  Review of patient's allergies indicates no known allergies.  Home Medications   Prior to Admission medications   Medication Sig Start Date End Date Taking? Authorizing Provider  azithromycin (ZITHROMAX) 250 MG tablet Take 1 tablet (250 mg total) by mouth daily. Take first 2 tablets together, then 1 every day until finished. 11/30/14   Gregor Hams, MD  exenatide (BYETTA 10 MCG PEN) 10 MCG/0.04ML SOLN Inject 10 mcg into the skin 2 (two) times daily with a meal.    Historical Provider, MD  fish oil-omega-3 fatty acids 1000 MG capsule Take 1 g by mouth daily.    Historical Provider, MD  guaiFENesin-codeine 100-10 MG/5ML syrup Take 5 mLs by mouth at bedtime as needed for cough.  11/30/14   Gregor Hams, MD  HYDROcodone-acetaminophen (NORCO/VICODIN) 5-325 MG per tablet Take 1-2 tablets by mouth every 6 hours as needed for pain. 03/17/13   Nicole Pisciotta, PA-C  Ibuprofen-Famotidine 800-26.6 MG TABS Take one tablet up to three times a day. 09/22/14   Jade L Breeback, PA-C  insulin detemir (LEVEMIR) 100 UNIT/ML injection Inject 52 Units into the skin daily.    Historical Provider, MD  ipratropium (ATROVENT) 0.06 % nasal spray Place 2 sprays into both nostrils 4 (four) times daily. 11/30/14   Gregor Hams, MD  metoCLOPramide (REGLAN) 10 MG tablet Take 10 mg by mouth 4 (four) times daily.    Historical Provider, MD  pioglitazone (ACTOS) 15 MG tablet Take 15 mg  by mouth daily.    Historical Provider, MD  predniSONE (DELTASONE) 20 MG tablet Take 1 tablet (20 mg total) by mouth 2 (two) times daily. Take with food. 01/04/15   Kandra Nicolas, MD  rosuvastatin (CRESTOR) 40 MG tablet Take 40 mg by mouth daily.    Historical Provider, MD  traMADol (ULTRAM) 50 MG tablet Take 1 tablet (50 mg total) by mouth at bedtime as needed for moderate pain. 01/04/15   Kandra Nicolas, MD   BP 128/80 mmHg  Pulse 59  Temp(Src) 98 F (36.7 C) (Oral)  Ht 6' (1.829 m)  Wt 260 lb (117.935 kg)  BMI 35.25 kg/m2  SpO2 97% Physical Exam  Constitutional: He is oriented to person, place, and time. He appears well-developed and well-nourished. No distress.  Patient is obese (BMI 35.3)  HENT:  Head: Normocephalic.  Mouth/Throat: Oropharynx is clear and moist.  Eyes: Pupils are equal, round, and reactive to light.  Neck: Normal range of motion.    Patient has pain in distribution as noted on diagram, but there is minimal tenderness to palpation in these locations.    Cardiovascular: Normal heart sounds.   Pulmonary/Chest: Breath sounds normal.  Musculoskeletal:       Right shoulder: He exhibits normal range of motion, no tenderness, no bony tenderness, no swelling, no effusion and no crepitus.       Arms: Lymphadenopathy:    He has no cervical adenopathy.  Neurological: He is alert and oriented to person, place, and time.  Skin: Skin is warm and dry. No rash noted.  Nursing note and vitals reviewed.   ED Course  Procedures  none    Imaging Review Dg Cervical Spine Complete  01/04/2015   CLINICAL DATA:  Right-sided neck pain and right shoulder pain. Paresthesias in the right arm and hand with neck movement.  EXAM: CERVICAL SPINE  4+ VIEWS  COMPARISON:  None.  FINDINGS: There is no disc space narrowing, facet arthritis, or foraminal stenosis. No prevertebral soft tissue swelling. Calcification is noted in both carotid bifurcations.  IMPRESSION: Normal cervical spine.   Calcifications in the carotid bifurcations.   Electronically Signed   By: Rozetta Nunnery M.D.   On: 01/04/2015 18:48     MDM   1. Neck pain on right side   2. Right cervical radiculopathy    Begin prednisone burst.  Tramadol 50mg  at bedtime prn. Apply ice pack for 20 to 30 minutes, 3 to 4 times daily  Continue until pain decreases.  Avoid lifting. Followup with Dr. Aundria Mems ([facility]) in 3 to 4 days.    Kandra Nicolas, MD 01/05/15 [phone]

## 2015-01-04 NOTE — ED Notes (Signed)
Rt shoulder pain x two weeks. Can't sleep, radiates down rt arm, fingers tingling slightly numb, pain worse today 8/10

## 2015-01-07 ENCOUNTER — Encounter: Payer: Self-pay | Admitting: Sports Medicine

## 2015-01-07 ENCOUNTER — Ambulatory Visit (INDEPENDENT_AMBULATORY_CARE_PROVIDER_SITE_OTHER): Payer: Managed Care, Other (non HMO) | Admitting: Sports Medicine

## 2015-01-07 VITALS — BP 136/70 | HR 55 | Ht 72.0 in | Wt 259.0 lb

## 2015-01-07 DIAGNOSIS — M5412 Radiculopathy, cervical region: Secondary | ICD-10-CM

## 2015-01-07 MED ORDER — CYCLOBENZAPRINE HCL 10 MG PO TABS: ORAL_TABLET | ORAL | Status: DC

## 2015-01-07 NOTE — Assessment & Plan Note (Signed)
Right-sided C5 and C7 distribution. Prednisone has not been effective, adding Flexeril at bedtime. X-rays did show multilevel cervical degenerative disc disease with anterior osteophytes. Formal physical therapy. We are going to obtain an MRI for interventional planning.  Return to see me to go over MRI results.

## 2015-01-07 NOTE — Progress Notes (Signed)
   Subjective:    I'm seeing this patient as a consultation for:  Dr. Assunta Found, Dr. Harriet Pho  CC: Right shoulder pain  HPI: This is a pleasant 64 year old male, he does a history of lumbar degenerative disc disease, he has seen a pain clinic, and he has bilateral knee pain. More importantly, and the recent for his visit, his pain that he localizes in his neck with radiation around the scapula, and down the right arm to the third and fourth fingers. Symptoms are moderate, persistent, worse with turning his neck to the right side. 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 Denies any constitutional symptoms or bowel or bladder dysfunction.  Past medical history, Surgical history, Family history not pertinant except as noted below, Social history, Allergies, and medications have been entered into the medical record, reviewed, and no changes needed.   Review of Systems: No headache, visual changes, nausea, vomiting, diarrhea, constipation, dizziness, abdominal pain, skin rash, fevers, chills, night sweats, weight loss, swollen lymph nodes, body aches, joint swelling, muscle aches, chest pain, shortness of breath, mood changes, visual or auditory hallucinations.   Objective:   General: Well Developed, well nourished, and in no acute distress.  Neuro/Psych: Alert and oriented x3, extra-ocular muscles intact, able to move all 4 extremities, sensation grossly intact. Skin: Warm and dry, no rashes noted.  Respiratory: Not using accessory muscles, speaking in full sentences, trachea midline.  Cardiovascular: Pulses palpable, no extremity edema. Abdomen: Does not appear distended. Neck: Negative spurling's Full neck range of motion Grip strength and sensation normal in bilateral hands Strength good C4 to T1 distribution No sensory change to C4 to T1 Reflexes on the right side are somewhat subdued to biceps, brachial radialis and triceps are unremarkable on both  sides, negative Hoffmann sign bilaterally. Shoulder itself has good range of motion.  Cervical spine x-rays were reviewed and do show multilevel anterior degenerative osteophytes.  Impression and Recommendations:   This case required medical decision making of moderate complexity.

## 2015-01-08 ENCOUNTER — Telehealth: Payer: Self-pay | Admitting: *Deleted

## 2015-01-08 NOTE — Telephone Encounter (Signed)
MRI cervical initiated in [place] - awaiting decision

## 2015-01-14 ENCOUNTER — Telehealth: Payer: Self-pay | Admitting: Sports Medicine

## 2015-01-14 NOTE — Telephone Encounter (Signed)
Dr. Darene Lamer please see note below. Rhonda Cunningham,CMA

## 2015-01-14 NOTE — Telephone Encounter (Signed)
Use heat not ice. Pain in neck is most likely cervical muscle spasm.

## 2015-01-14 NOTE — Telephone Encounter (Signed)
Patient walked in to drop off disc of images and would like to know if he can use an ice pack for his current situation instead of heating pad. If u can call and advise. Thanks

## 2015-01-14 NOTE — Telephone Encounter (Signed)
Patient has been informed. Rhonda Cunningham,CMA  

## 2015-01-15 ENCOUNTER — Ambulatory Visit: Payer: Self-pay | Admitting: Sports Medicine

## 2015-01-18 ENCOUNTER — Ambulatory Visit (INDEPENDENT_AMBULATORY_CARE_PROVIDER_SITE_OTHER): Payer: Managed Care, Other (non HMO) | Admitting: Sports Medicine

## 2015-01-18 ENCOUNTER — Encounter: Payer: Self-pay | Admitting: Sports Medicine

## 2015-01-18 VITALS — BP 126/77 | HR 86 | Ht 72.0 in | Wt 259.0 lb

## 2015-01-18 DIAGNOSIS — M5412 Radiculopathy, cervical region: Secondary | ICD-10-CM

## 2015-01-18 MED ORDER — CYCLOBENZAPRINE HCL 10 MG PO TABS: ORAL_TABLET | ORAL | Status: DC

## 2015-01-18 NOTE — Assessment & Plan Note (Signed)
Multilevel disc protrusions from  C4-C7. He also has a T1-T2 protrusion. Symptoms predominantly represent a right C7 radiculopathy.  we are going to continue formal physical therapy for now and if no improvement we will try a right-sided  C6-C7 interlaminar epidural.  he is looking to get disability.

## 2015-01-18 NOTE — Progress Notes (Signed)
  Subjective:    CC: MRI results  HPI: William Webster is a pleasant 64 year old male with neck pain for sometime now radiating down the right arm and a C7 distribution, he also has some right-sided axillary and parathoracic symptoms radiating around to the anterior chest wall. He does have a history of lumbar degenerative disc disease and history of injections done with [facility], I do not have any reports as to what these were. Pain is moderate, persistent, he is going to be starting formal physical therapy, and is amenable to do physical therapy before proceeding with epidural injections.  Past medical history, Surgical history, Family history not pertinant except as noted below, Social history, Allergies, and medications have been entered into the medical record, reviewed, and no changes needed.   Review of Systems: No fevers, chills, night sweats, weight loss, chest pain, or shortness of breath.   Objective:    General: Well Developed, well nourished, and in no acute distress.  Neuro: Alert and oriented x3, extra-ocular muscles intact, sensation grossly intact.  HEENT: Normocephalic, atraumatic, pupils equal round reactive to light, neck supple, no masses, no lymphadenopathy, thyroid nonpalpable.  Skin: Warm and dry, no rashes. Cardiac: Regular rate and rhythm, no murmurs rubs or gallops, no lower extremity edema.  Respiratory: Clear to auscultation bilaterally. Not using accessory muscles, speaking in full sentences.  MRI was personally reviewed and shows multilevel facet spondylosis, there is also multilevel degenerative disc disease from C4-C7, he also has a moderate sized T1-T2 disc protrusion. All of these protrusions do appear to affect both the left and the right neural foramina.  Impression and Recommendations:

## 2015-01-19 ENCOUNTER — Ambulatory Visit (INDEPENDENT_AMBULATORY_CARE_PROVIDER_SITE_OTHER): Payer: Managed Care, Other (non HMO) | Admitting: Physical Therapy

## 2015-01-19 DIAGNOSIS — M6281 Muscle weakness (generalized): Secondary | ICD-10-CM

## 2015-01-19 DIAGNOSIS — M5412 Radiculopathy, cervical region: Secondary | ICD-10-CM

## 2015-01-19 DIAGNOSIS — M2560 Stiffness of unspecified joint, not elsewhere classified: Secondary | ICD-10-CM

## 2015-01-19 DIAGNOSIS — M2550 Pain in unspecified joint: Secondary | ICD-10-CM

## 2015-01-25 ENCOUNTER — Encounter (INDEPENDENT_AMBULATORY_CARE_PROVIDER_SITE_OTHER): Payer: Managed Care, Other (non HMO) | Admitting: Physical Therapy

## 2015-01-25 DIAGNOSIS — M2560 Stiffness of unspecified joint, not elsewhere classified: Secondary | ICD-10-CM

## 2015-01-25 DIAGNOSIS — M6281 Muscle weakness (generalized): Secondary | ICD-10-CM

## 2015-01-25 DIAGNOSIS — M5412 Radiculopathy, cervical region: Secondary | ICD-10-CM

## 2015-01-25 DIAGNOSIS — M2550 Pain in unspecified joint: Secondary | ICD-10-CM

## 2015-01-25 NOTE — Telephone Encounter (Signed)
Received authorization#, put into EPIC, called Helen in Imaging and gave authorization to [place].

## 2015-01-26 ENCOUNTER — Encounter: Payer: Self-pay | Admitting: Sports Medicine

## 2015-01-27 ENCOUNTER — Encounter (INDEPENDENT_AMBULATORY_CARE_PROVIDER_SITE_OTHER): Payer: Managed Care, Other (non HMO) | Admitting: Physical Therapy

## 2015-01-27 DIAGNOSIS — M6281 Muscle weakness (generalized): Secondary | ICD-10-CM

## 2015-01-27 DIAGNOSIS — M5412 Radiculopathy, cervical region: Secondary | ICD-10-CM

## 2015-01-27 DIAGNOSIS — M2560 Stiffness of unspecified joint, not elsewhere classified: Secondary | ICD-10-CM

## 2015-01-27 DIAGNOSIS — M2550 Pain in unspecified joint: Secondary | ICD-10-CM

## 2015-02-01 ENCOUNTER — Telehealth: Payer: Self-pay | Admitting: Sports Medicine

## 2015-02-01 ENCOUNTER — Encounter (INDEPENDENT_AMBULATORY_CARE_PROVIDER_SITE_OTHER): Payer: Managed Care, Other (non HMO) | Admitting: Physical Therapy

## 2015-02-01 DIAGNOSIS — M5412 Radiculopathy, cervical region: Secondary | ICD-10-CM

## 2015-02-01 DIAGNOSIS — M2550 Pain in unspecified joint: Secondary | ICD-10-CM

## 2015-02-01 DIAGNOSIS — M6281 Muscle weakness (generalized): Secondary | ICD-10-CM

## 2015-02-01 DIAGNOSIS — M2560 Stiffness of unspecified joint, not elsewhere classified: Secondary | ICD-10-CM

## 2015-02-01 MED ORDER — CYCLOBENZAPRINE HCL 10 MG PO TABS: ORAL_TABLET | ORAL | Status: DC

## 2015-02-01 NOTE — Telephone Encounter (Signed)
Patient called back and states that he just found out his work is not giving him Short Term Disability so he is going to have to return to work on wed 02-03-15 and needs a note allowing him to go back to work. Still needs the muscle relaxer called in so he can take it when he's home from work. Can he try the epidural injections? Please let me know and I will be glad to let my friend know. Thanks, [employer]

## 2015-02-01 NOTE — Telephone Encounter (Signed)
Mr. William Webster does not feel the physical therapy is helping and he is still in a lot of pain. He was wondering if he could try the epidural shots. He also needs another note for work his runs out this week. - CF

## 2015-02-01 NOTE — Telephone Encounter (Signed)
We just started, we need a solid 4-6 weeks.

## 2015-02-01 NOTE — Telephone Encounter (Signed)
Muscle relaxer called in, yes he can try epidural injections but we should finish a full course of conservative measures before trying anything invasive like an injection. We also discussed the short-term disability would be an unlikely possibility. I have written a letter and it is in my box.

## 2015-02-03 ENCOUNTER — Encounter: Payer: Self-pay | Admitting: Physical Therapy

## 2015-02-03 ENCOUNTER — Encounter: Payer: Self-pay | Admitting: Sports Medicine

## 2015-02-08 ENCOUNTER — Encounter: Payer: Self-pay | Admitting: Physical Therapy

## 2015-02-10 ENCOUNTER — Encounter: Payer: Self-pay | Admitting: Physical Therapy

## 2015-02-15 ENCOUNTER — Encounter: Payer: Self-pay | Admitting: Physical Therapy

## 2015-02-17 ENCOUNTER — Encounter: Payer: Self-pay | Admitting: Physical Therapy

## 2015-05-27 ENCOUNTER — Emergency Department (INDEPENDENT_AMBULATORY_CARE_PROVIDER_SITE_OTHER)
Admission: EM | Admit: 2015-05-27 | Discharge: 2015-05-27 | Disposition: A | Discharge status: Home / Self Care | Payer: Managed Care, Other (non HMO) | Source: Home / Self Care | Attending: Emergency Medicine | Admitting: Emergency Medicine

## 2015-05-27 ENCOUNTER — Encounter: Payer: Self-pay | Admitting: *Deleted

## 2015-05-27 DIAGNOSIS — J209 Acute bronchitis, unspecified: Secondary | ICD-10-CM

## 2015-05-27 DIAGNOSIS — J0101 Acute recurrent maxillary sinusitis: Secondary | ICD-10-CM

## 2015-05-27 MED ORDER — FLUTICASONE PROPIONATE 50 MCG/ACT NA SUSP: NASAL | Status: DC

## 2015-05-27 MED ORDER — CEFDINIR 300 MG PO CAPS: 300.0000 mg | ORAL_CAPSULE | Freq: Two times a day (BID) | ORAL | Status: DC

## 2015-05-27 MED ORDER — CEFTRIAXONE SODIUM 250 MG IJ SOLR
500.0000 mg | Freq: Once | INTRAMUSCULAR | Status: AC
Start: 2015-05-27 — End: 2015-05-27
  Administered 2015-05-27: 500 mg via INTRAMUSCULAR

## 2015-05-27 NOTE — ED Provider Notes (Signed)
CSN: [id number]     Arrival date & time 05/27/15  9741 History   First MD Initiated Contact with Patient 05/27/15 [phone]     Chief Complaint  Patient presents with  . Nasal Congestion  . Headache    HPI SINUSITIS  Onset: 5 days Facial/sinus pressure with discolored nasal mucus.    Severity: moderate-severe, progressively worsening Tried OTC meds without significant relief.  Symptoms:  + Fever , chills, night sweats + URI prodrome with nasal congestion + Minimal swollen neck glands + mild Sinus Headache + mild ear pressure  No Allergy symptoms No significant Sore Throat No eye symptoms     Mild Cough, productive of discolored sputum No chest pain No shortness of breath  No wheezing  No Abdominal Pain No Nausea No Vomiting No diarrhea  No Myalgias No focal neurologic symptoms No syncope No Rash  No Urinary symptoms  He mentions his past medical history of type 2 diabetes. He states this is controlled with A1c recently 6.3.  Remainder of Review of Systems negative for acute change except as noted in the HPI.  Past Medical History  Diagnosis Date  . Hyperlipidemia   . Post concussive syndrome   . Diabetes mellitus without complication    Past Surgical History  Procedure Laterality Date  . Hemorrhoid surgery    . Tonsillectomy     Family History  Problem Relation Age of Onset  . Cancer Mother     colon  . Heart failure Mother   . Diabetes Mother   . Stroke Father    History  Substance Use Topics  . Smoking status: Current Some Day Smoker    Types: Cigars  . Smokeless tobacco: Never Used     Comment: occassional cigar  . Alcohol Use: Yes     Comment: social    Review of Systems  Allergies  Review of patient's allergies indicates no known allergies.  Home Medications   Prior to Admission medications   Medication Sig Start Date End Date Taking? Authorizing Provider  cabergoline (DOSTINEX) 0.5 MG tablet Take 0.25 mg by mouth 2 (two) times a  week.    Historical Provider, MD  cefdinir (OMNICEF) 300 MG capsule Take 1 capsule (300 mg total) by mouth 2 (two) times daily. X 10 days 05/27/15   Jacqulyn Cane, MD  cyclobenzaprine (FLEXERIL) 10 MG tablet One half tab PO qHS, then increase gradually to one tab TID. 02/01/15   Silverio Decamp, MD  exenatide (BYETTA 10 MCG PEN) 10 MCG/0.04ML SOLN Inject 10 mcg into the skin 2 (two) times daily with a meal.    Historical Provider, MD  fish oil-omega-3 fatty acids 1000 MG capsule Take 1 g by mouth daily.    Historical Provider, MD  fluticasone Asencion Islam) 50 MCG/ACT nasal spray 1 or 2 sprays each nostril twice a day 05/27/15   Jacqulyn Cane, MD  Ibuprofen-Famotidine 800-26.6 MG TABS Take one tablet up to three times a day. 09/22/14   Jade L Breeback, PA-C  insulin detemir (LEVEMIR) 100 UNIT/ML injection Inject 52 Units into the skin daily.    Historical Provider, MD  ipratropium (ATROVENT) 0.06 % nasal spray Place 2 sprays into both nostrils 4 (four) times daily. 11/30/14   Gregor Hams, MD  metoCLOPramide (REGLAN) 10 MG tablet Take 10 mg by mouth 4 (four) times daily.    Historical Provider, MD  pioglitazone (ACTOS) 15 MG tablet Take 15 mg by mouth daily.    Historical Provider, MD  rosuvastatin (  CRESTOR) 40 MG tablet Take 40 mg by mouth daily.    Historical Provider, MD   BP 144/89 mmHg  Pulse 84  Temp(Src) 98 F (36.7 C) (Oral)  Resp 16  Wt 255 lb (115.667 kg)  SpO2 95% Physical Exam  Constitutional: He is oriented to person, place, and time. He appears well-developed and well-nourished. No distress.  HENT:  Head: Normocephalic and atraumatic.  Right Ear: Tympanic membrane, external ear and ear canal normal.  Left Ear: Tympanic membrane, external ear and ear canal normal.  Nose: Mucosal edema and rhinorrhea present. Right sinus exhibits maxillary sinus tenderness. Left sinus exhibits maxillary sinus tenderness.  Mouth/Throat: Oropharynx is clear and moist. No oral lesions. No oropharyngeal  exudate.  Eyes: Right eye exhibits no discharge. Left eye exhibits no discharge. No scleral icterus.  Neck: Neck supple.  Cardiovascular: Normal rate, regular rhythm and normal heart sounds.   Pulmonary/Chest: Effort normal. No respiratory distress. He has no wheezes. He has rhonchi. He has no rales.  Lymphadenopathy:    He has no cervical adenopathy.  Neurological: He is alert and oriented to person, place, and time.  Skin: Skin is warm and dry.  Nursing note and vitals reviewed.    Procedures  MDM   1. Acute recurrent maxillary sinusitis   2. Acute bronchitis, unspecified organism    Treatment options discussed, as well as risks, benefits, alternatives. He requested a shot of an antibiotic to start his treatment. Patient voiced understanding and agreement with the following plans: Rocephin 500 mg IM stat New Prescriptions   CEFDINIR (OMNICEF) 300 MG CAPSULE    Take 1 capsule (300 mg total) by mouth 2 (two) times daily. X 10 days   FLUTICASONE (FLONASE) 50 MCG/ACT NASAL SPRAY    1 or 2 sprays each nostril twice a day   He declined any prescription cough med. Other OTC care discussed. We've elected not to treat with parenteral steroids, because of his history of diabetes. However, if allergic or inflammatory symptoms worsen, a treatment option could be IM or by mouth steroid, but we both are holding off that option at this time. Follow-up with your primary care doctor in 5-7 days if not improving, or sooner if symptoms become worse. Precautions discussed. Red flags discussed. Questions invited and answered. Patient voiced understanding and agreement.      Jacqulyn Cane, MD 05/27/15 1023

## 2015-05-27 NOTE — ED Notes (Signed)
Pt c/o 4 days of congestion, cough, HA and hoarseness.

## 2015-05-28 ENCOUNTER — Telehealth: Payer: Self-pay | Admitting: Emergency Medicine

## 2015-05-28 MED ORDER — HYDROCODONE-HOMATROPINE 5-1.5 MG/5ML PO SYRP: 5.0000 mL | ORAL_SOLUTION | Freq: Four times a day (QID) | ORAL | Status: DC | PRN

## 2015-05-28 NOTE — ED Provider Notes (Signed)
Pt request rx for cough medication.   Rx for hydromet   Fransico Meadow, PA-C 05/28/15 [place], PA-C 05/28/15 1140

## 2015-06-10 LAB — PROLACTIN: PROLACTIN: 23.5

## 2015-06-10 LAB — HEMOGLOBIN A1C: HEMOGLOBIN A1C: 11.3 % — AB (ref 4.0–6.0)

## 2015-07-15 ENCOUNTER — Encounter: Payer: Self-pay | Admitting: Sports Medicine

## 2015-07-15 ENCOUNTER — Ambulatory Visit (INDEPENDENT_AMBULATORY_CARE_PROVIDER_SITE_OTHER): Payer: Managed Care, Other (non HMO) | Admitting: Sports Medicine

## 2015-07-15 ENCOUNTER — Ambulatory Visit (INDEPENDENT_AMBULATORY_CARE_PROVIDER_SITE_OTHER): Payer: Managed Care, Other (non HMO)

## 2015-07-15 VITALS — BP 125/76 | HR 60 | Ht 72.0 in | Wt 259.0 lb

## 2015-07-15 DIAGNOSIS — M47816 Spondylosis without myelopathy or radiculopathy, lumbar region: Secondary | ICD-10-CM | POA: Insufficient documentation

## 2015-07-15 DIAGNOSIS — E669 Obesity, unspecified: Secondary | ICD-10-CM | POA: Diagnosis not present

## 2015-07-15 DIAGNOSIS — M25561 Pain in right knee: Secondary | ICD-10-CM | POA: Diagnosis not present

## 2015-07-15 DIAGNOSIS — M25562 Pain in left knee: Secondary | ICD-10-CM

## 2015-07-15 DIAGNOSIS — M170 Bilateral primary osteoarthritis of knee: Secondary | ICD-10-CM | POA: Diagnosis not present

## 2015-07-15 DIAGNOSIS — M8588 Other specified disorders of bone density and structure, other site: Secondary | ICD-10-CM

## 2015-07-15 MED ORDER — MELOXICAM 15 MG PO TABS: ORAL_TABLET | ORAL | Status: DC

## 2015-07-15 MED ORDER — PHENTERMINE HCL 37.5 MG PO TABS: ORAL_TABLET | ORAL | Status: DC

## 2015-07-15 NOTE — Assessment & Plan Note (Signed)
X-rays, weight loss, meloxicam.

## 2015-07-15 NOTE — Assessment & Plan Note (Signed)
X-rays, formal physical therapy, meloxicam.

## 2015-07-15 NOTE — Assessment & Plan Note (Signed)
Starting phentermine, return monthly for weight checks and refills. He will establish with our new physician when she starts.

## 2015-07-15 NOTE — Progress Notes (Signed)
  Subjective:    CC: William Webster comes in, he has a lot of things to discuss  HPI: Cervical spondylosis: Never followed up, but feels okay.  Low back pain: Worse with standing and twisting, axial without radiation. Amenable to start conservatively, no bowel or bladder dysfunction or saddle numbness. Not taking any NSAIDs  Knee pain: Bilateral, moderate, persistent localized at the medial joint lines without mechanical symptoms.  Obesity: Amenable to start with weight loss, he does plan to transition care to [place].  Past medical history, Surgical history, Family history not pertinant except as noted below, Social history, Allergies, and medications have been entered into the medical record, reviewed, and no changes needed.   Review of Systems: No fevers, chills, night sweats, weight loss, chest pain, or shortness of breath.   Objective:    General: Well Developed, well nourished, and in no acute distress.  Neuro: Alert and oriented x3, extra-ocular muscles intact, sensation grossly intact.  HEENT: Normocephalic, atraumatic, pupils equal round reactive to light, neck supple, no masses, no lymphadenopathy, thyroid nonpalpable.  Skin: Warm and dry, no rashes. Cardiac: Regular rate and rhythm, no murmurs rubs or gallops, no lower extremity edema.  Respiratory: Clear to auscultation bilaterally. Not using accessory muscles, speaking in full sentences. Back Exam:  Inspection: Unremarkable  Motion: Flexion 45 deg, Extension 45 deg, Side Bending to 45 deg bilaterally,  Rotation to 45 deg bilaterally  SLR laying: Negative  XSLR laying: Negative  Palpable tenderness: None. FABER: negative. Sensory change: Gross sensation intact to all lumbar and sacral dermatomes.  Reflexes: 2+ at both patellar tendons, 2+ at achilles tendons, Babinski's downgoing.  Strength at foot  Plantar-flexion: 5/5 Dorsi-flexion: 5/5 Eversion: 5/5 Inversion: 5/5  Leg strength  Quad: 5/5 Hamstring: 5/5 Hip flexor: 5/5 Hip  abductors: 5/5  Gait unremarkable. Bilateral: Normal to inspection with no erythema or effusion or obvious bony abnormalities. Tender to palpation along the medial joint line's ROM normal in flexion and extension and lower leg rotation. Ligaments with solid consistent endpoints including ACL, PCL, LCL, MCL. Negative Mcmurray's and provocative meniscal tests. Non painful patellar compression. Patellar and quadriceps tendons unremarkable. Hamstring and quadriceps strength is normal.  Impression and Recommendations:    I spent 40 minutes with this patient, greater than 50% was face-to-face time counseling regarding the above diagnoses

## 2015-08-03 ENCOUNTER — Ambulatory Visit: Payer: Self-pay | Admitting: Rehabilitative and Restorative Service Providers"

## 2015-08-09 ENCOUNTER — Encounter: Payer: Self-pay | Admitting: Rehabilitative and Restorative Service Providers"

## 2015-08-09 ENCOUNTER — Ambulatory Visit (INDEPENDENT_AMBULATORY_CARE_PROVIDER_SITE_OTHER): Payer: Managed Care, Other (non HMO) | Admitting: Rehabilitative and Restorative Service Providers"

## 2015-08-09 DIAGNOSIS — M2560 Stiffness of unspecified joint, not elsewhere classified: Secondary | ICD-10-CM

## 2015-08-09 DIAGNOSIS — M545 Low back pain, unspecified: Secondary | ICD-10-CM

## 2015-08-09 DIAGNOSIS — R29898 Other symptoms and signs involving the musculoskeletal system: Secondary | ICD-10-CM | POA: Diagnosis not present

## 2015-08-09 DIAGNOSIS — M47816 Spondylosis without myelopathy or radiculopathy, lumbar region: Secondary | ICD-10-CM

## 2015-08-09 NOTE — Therapy (Signed)
[facility] [place], [place], [address] Phone: [phone]   Fax:  [phone]  Physical Therapy Evaluation  Patient Details  Name: William Webster MRN: [id number] Date of Birth: [birth year] Referring Provider:  Silverio Decamp,*  Encounter Date: 08/09/2015      PT End of Session - 08/09/15 1116    Visit Number 1   Number of Visits 6   Date for PT Re-Evaluation 09/20/15   PT Start Time 1017   PT Stop Time 1104   PT Time Calculation (min) 47 min   Activity Tolerance Patient tolerated treatment well  felt better after exercises -looser      Past Medical History  Diagnosis Date  . Hyperlipidemia   . Post concussive syndrome   . Diabetes mellitus without complication     Past Surgical History  Procedure Laterality Date  . Hemorrhoid surgery    . Tonsillectomy      There were no vitals filed for this visit.  Visit Diagnosis:  Spondylosis of lumbar region without myelopathy or radiculopathy - Plan: PT plan of care cert/re-cert  Stiffness in joint - Plan: PT plan of care cert/re-cert  Weakness of both legs - Plan: PT plan of care cert/re-cert  Midline low back pain without sciatica - Plan: PT plan of care cert/re-cert      Subjective Assessment - 08/09/15 1019    Subjective William Webster reports problems with LB for several years. He states that he was getting out of his pool when his legs collapsed - felt weakness but not sicnificant pain. Diagnosed with spondylosis. Now back to baseline.   Pertinent History Sciatica 2012 out of work for [employer] - returned to work 2014 with continued pain; retired in Feb 2016; AODM; coccyx fx in the 1980's: cervical disc dysfunction seen in PT Feb 2016   How long can you sit comfortably? ~3 hours   How long can you stand comfortably? ~1 hour   How long can you walk comfortably? ~30-40 min    Diagnostic tests xrays    Patient Stated Goals improve pain; learn  exercise program. (Patient ask to come to PT only 1x/wk due to 20% co-pay per visit.)   Currently in Pain? Yes   Pain Score 1    Pain Location Back   Pain Orientation Mid;Lower   Pain Descriptors / Indicators Nagging   Pain Type Chronic pain   Pain Onset More than a month ago   Pain Frequency Intermittent   Aggravating Factors  Lifting; bending forward   Pain Relieving Factors Lying down on back - straighten out            [facility] PT Assessment - 08/09/15 0001    Assessment   Medical Diagnosis LBP   Onset Date/Surgical Date 02/09/15   Hand Dominance Right   Next MD Visit 09/12/15   Prior Therapy for cervical spine   Balance Screen   Has the patient fallen in the past 6 months Yes   How many times? 1   Has the patient had a decrease in activity level because of a fear of falling?  No   Is the patient reluctant to leave their home because of a fear of falling?  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  jobs invloved lifting/walking and standing on concrete   Leisure walking dog/sitting in a chair/relaxing   Observation/Other Assessments   Focus on Therapeutic Outcomes (FOTO)  33% limitation   Sensation   Additional Comments WFL's per patient report   Posture/Postural Control   Posture Comments head forward; shoulders rounded and elevated; flexed forward at hips   AROM   Lumbar Flexion 80%   Lumbar Extension 45%   Lumbar - Right Side Bend 75%   Lumbar - Left Side Bend 75%   Lumbar - Right Rotation 65%   Lumbar - Left Rotation 65%   Strength   Right/Left Hip --  tightness through bilat hips at end ranges   Right Hip Flexion --  5-/5   Right Hip Extension 5/5   Right Hip ABduction 5/5   Right Hip ADduction 5/5   Left Hip Flexion 5/5   Left Hip Extension --  5-/5    Left Hip ABduction 5/5   Left Hip ADduction 5/5   Flexibility   Hamstrings Rt 77 deg; Lt 75 deg   Quadriceps Rt heel 12 inches from buttock; Lt 10 in   ITB tight bilat   Piriformis tight bilat Lt>Rt   Palpation   Spinal mobility pain with spring testing lumbar spine at L5/Si; L4/L5; L3/L4 decreasing    Palpation comment tightness through piriformis and hip abductor musculature bilat   FABER test   findings Negative   Comment hp tightness   Prone Knee Bend Test   Findings Negative   Comment tight quads   Straight Leg Raise   Findings Negative   Comment tight hamstrings                   [facility] Adult PT Treatment/Exercise - 08/09/15 0001    Self-Care   Self-Care --  sitting modification for home   Lumbar Exercises: Stretches   Passive Hamstring Stretch 3 reps;30 seconds   Passive Hamstring Stretch Limitations opposite knee bent; with strap   ITB Stretch 3 reps;30 seconds   ITB Stretch Limitations with strap   Piriformis Stretch 3 reps;30 seconds   Piriformis Stretch Limitations supine with strap   Lumbar Exercises: Supine   Ab Set 10 reps  10 sec hold   AB Set Limitations 3 part core                PT Education - 08/09/15 1110    Education provided Yes   Education Details Back care and the improtance of exercise inc stretching and core stabilization; suggestions for modification for sitting; HEP   Person(s) Educated Patient   Methods Explanation;Demonstration;Tactile cues;Verbal cues;Handout   Comprehension Verbalized understanding;Returned demonstration;Verbal cues required;Tactile cues required             PT Long Term Goals - 08/09/15 1126    PT LONG TERM GOAL #1   Title Instruct patient in HEP for discharge - 09/22/15   Time 6   Period Weeks   Status New   PT LONG TERM GOAL #2   Title Increase hamstring flexibility bo 85 degrees bilat 09/22/15   Time 6   Period Weeks   Status New   PT LONG TERM GOAL #3   Title Increase strength to  5/5 bilat hip extension 09/22/15   Time 6   Period Weeks   Status New   PT LONG TERM GOAL #4   Title Encourage consistent HEP and increased activity level. Patient to report physical activity of at least 30 min 3-4  times/week 09/22/15   Time 6   Period Weeks   Status New   PT LONG TERM GOAL #5   Title Decrease FOTO to </=28% limitation   Time 6   Period Weeks   Status New               Plan - 08/09/15 1117    Clinical Impression Statement William Webster presents with flare up of low back pain in the past month. He has fallen once after getting out of the swimming pool following strenuous activity playing with kids. He reports that his legs "just gave way". William Webster demonstrates limited trunk and LE ROM and mobility; decreased hip extension strength; pain and tightness with palpation through the lumbar spine and into bilat piriformis/hip abductors; sedentary lifestyle with long periods of sitting; pain. William Webster is concerned with the cost of PT and would like to come for treatment only 1x/wk to learn the exercises he can do at home. He will investigate purchase of a TENS unit for ome use. Patient will benefit from Physical Therpay to improve mobility through lumbar spine and LE's; increase core stability; instruct in appropriate HEP and encourage more active lifestyle.     Pt will benefit from skilled therapeutic intervention in order to improve on the following deficits Decreased range of motion;Decreased mobility;Decreased strength;Decreased activity tolerance;Pain   Rehab Potential Good   PT Frequency 1x / week   PT Duration 6 weeks   PT Treatment/Interventions Patient/family education;ADLs/Self Care Home Management;Therapeutic exercise;Therapeutic activities;Neuromuscular re-education;Cryotherapy;Manual techniques   PT Next Visit Plan review HEP; progress with core stabilizaiton and education - patient will check on ordering a TENS unit for home. (has 20% co-pay and wants to decrease cost for therapy)    PT Home Exercise Plan core stabilizaiton; LE stretching   Consulted and Agree with Plan of Care Patient         Problem List Patient Active Problem List   Diagnosis Date Noted  . Obesity 07/15/2015  . Spondylosis of lumbar region without myelopathy or radiculopathy 07/15/2015  . Primary osteoarthritis of both knees 07/15/2015  . Right cervical radiculopathy 01/07/2015    Danile Trier Nilda Simmer, PT, MPH 08/09/2015, 11:56 AM  [facility] [place], [place], [address] Phone: [phone]   Fax:  [phone]

## 2015-08-09 NOTE — Patient Instructions (Signed)
Abdominal Bracing With Pelvic Floor (Hook-Lying)   With neutral spine, tighten pelvic floor and abdominals, tighten muscles at waist line. Hold 10 sec Repeat 10_ times. Do _several__ times a day. Progress to do this in sitting; standing; walking and with functional activities throughout the day   Piriformis Stretch   With one leg straight cross one leg over. Keeping both shoulders on floor, pull crossed leg over across your body. Hold 30 sec  Repeat with other leg. Repeat __3__ times. Do __2__ sessions per day.  Hamstring Step 1   Straighten left knee. Keep right bent. Raise leg until you feel a stretch Hold 30___ seconds. Relax knee by returning foot to start. Repeat _3__ times.   Repeat above this time pulling leg across body to stretch the side of your hip and leg. Repeat as above.

## 2015-08-12 ENCOUNTER — Ambulatory Visit (INDEPENDENT_AMBULATORY_CARE_PROVIDER_SITE_OTHER): Payer: Managed Care, Other (non HMO) | Admitting: Sports Medicine

## 2015-08-12 ENCOUNTER — Encounter: Payer: Self-pay | Admitting: Sports Medicine

## 2015-08-12 VITALS — BP 127/73 | HR 63 | Ht 72.0 in | Wt 262.0 lb

## 2015-08-12 DIAGNOSIS — E669 Obesity, unspecified: Secondary | ICD-10-CM

## 2015-08-12 DIAGNOSIS — M47816 Spondylosis without myelopathy or radiculopathy, lumbar region: Secondary | ICD-10-CM | POA: Diagnosis not present

## 2015-08-12 MED ORDER — MELOXICAM 15 MG PO TABS: ORAL_TABLET | ORAL | Status: DC

## 2015-08-12 MED ORDER — PHENTERMINE HCL 37.5 MG PO TABS: ORAL_TABLET | ORAL | Status: DC

## 2015-08-12 MED ORDER — TOPIRAMATE 50 MG PO TABS: ORAL_TABLET | ORAL | Status: DC

## 2015-08-12 NOTE — Assessment & Plan Note (Signed)
Overall doing better with physical therapy. Refilling meloxicam. He does have widespread degenerative disc disease worst at the L2-L3 and L3-L4 levels. Should he fail an additional month of physical therapy we will get an MRI and set him up for interlaminar epidural.

## 2015-08-12 NOTE — Assessment & Plan Note (Signed)
Somehow was able to gain weight on phentermine. Refilling phentermine and switching to one half tab twice a day, adding Topamax. He is already on a GLP-1 agonist. Return to see me in one month for a weight check. We did discuss dietary changes.

## 2015-08-12 NOTE — Progress Notes (Signed)
  Subjective:    CC: Follow-up  HPI: Obesity: Did not lose any weight on phentermine after the first month, tells me he still struggles with eating late, as well as a voracious appetite. He would like another try. He is currently on a GLP-1 agonist already for his diabetes, as well as insulin, amenable to also add Topamax.    Lumbar spondylosis: Improving with meloxicam, cyclobenzaprine, and formal physical therapy, understands he needs more PT and time before we consider MRI for interventional planning.  Other medical issues: Is established with Dr. Sheppard Coil.  Past medical history, Surgical history, Family history not pertinant except as noted below, Social history, Allergies, and medications have been entered into the medical record, reviewed, and no changes needed.   Review of Systems: No fevers, chills, night sweats, weight loss, chest pain, or shortness of breath.   Objective:    General: Well Developed, well nourished, and in no acute distress.  Neuro: Alert and oriented x3, extra-ocular muscles intact, sensation grossly intact.  HEENT: Normocephalic, atraumatic, pupils equal round reactive to light, neck supple, no masses, no lymphadenopathy, thyroid nonpalpable.  Skin: Warm and dry, no rashes. Cardiac: Regular rate and rhythm, no murmurs rubs or gallops, no lower extremity edema.  Respiratory: Clear to auscultation bilaterally. Not using accessory muscles, speaking in full sentences.  Impression and Recommendations:    I spent 25 minutes with this patient, greater than 50% was face-to-face time counseling regarding the above diagnoses

## 2015-08-16 ENCOUNTER — Ambulatory Visit (INDEPENDENT_AMBULATORY_CARE_PROVIDER_SITE_OTHER): Payer: Managed Care, Other (non HMO) | Admitting: Osteopathic Medicine

## 2015-08-16 ENCOUNTER — Encounter: Payer: Self-pay | Admitting: Osteopathic Medicine

## 2015-08-16 ENCOUNTER — Ambulatory Visit (INDEPENDENT_AMBULATORY_CARE_PROVIDER_SITE_OTHER): Payer: Managed Care, Other (non HMO) | Admitting: Rehabilitative and Restorative Service Providers"

## 2015-08-16 ENCOUNTER — Encounter: Payer: Self-pay | Admitting: Rehabilitative and Restorative Service Providers"

## 2015-08-16 VITALS — BP 126/81 | HR 72 | Ht 72.0 in | Wt 257.0 lb

## 2015-08-16 DIAGNOSIS — Z79899 Other long term (current) drug therapy: Secondary | ICD-10-CM

## 2015-08-16 DIAGNOSIS — M47816 Spondylosis without myelopathy or radiculopathy, lumbar region: Secondary | ICD-10-CM

## 2015-08-16 DIAGNOSIS — E119 Type 2 diabetes mellitus without complications: Secondary | ICD-10-CM | POA: Diagnosis not present

## 2015-08-16 DIAGNOSIS — E669 Obesity, unspecified: Secondary | ICD-10-CM

## 2015-08-16 DIAGNOSIS — M2560 Stiffness of unspecified joint, not elsewhere classified: Secondary | ICD-10-CM | POA: Diagnosis not present

## 2015-08-16 DIAGNOSIS — E875 Hyperkalemia: Secondary | ICD-10-CM

## 2015-08-16 DIAGNOSIS — R29898 Other symptoms and signs involving the musculoskeletal system: Secondary | ICD-10-CM

## 2015-08-16 DIAGNOSIS — D352 Benign neoplasm of pituitary gland: Secondary | ICD-10-CM

## 2015-08-16 DIAGNOSIS — M545 Low back pain, unspecified: Secondary | ICD-10-CM

## 2015-08-16 DIAGNOSIS — Z114 Encounter for screening for human immunodeficiency virus [HIV]: Secondary | ICD-10-CM | POA: Diagnosis not present

## 2015-08-16 DIAGNOSIS — E221 Hyperprolactinemia: Secondary | ICD-10-CM

## 2015-08-16 DIAGNOSIS — Z1159 Encounter for screening for other viral diseases: Secondary | ICD-10-CM

## 2015-08-16 DIAGNOSIS — E785 Hyperlipidemia, unspecified: Secondary | ICD-10-CM | POA: Diagnosis not present

## 2015-08-16 LAB — CBC WITH DIFFERENTIAL/PLATELET
Basophils Absolute: 0 10*3/uL (ref 0.0–0.1)
Basophils Relative: 0 % (ref 0–1)
EOS ABS: 0.1 10*3/uL (ref 0.0–0.7)
Eosinophils Relative: 1 % (ref 0–5)
HEMATOCRIT: 43.2 % (ref 39.0–52.0)
HEMOGLOBIN: 14.3 g/dL (ref 13.0–17.0)
LYMPHS ABS: 1.4 10*3/uL (ref 0.7–4.0)
LYMPHS PCT: 22 % (ref 12–46)
MCH: 28.4 pg (ref 26.0–34.0)
MCHC: 33.1 g/dL (ref 30.0–36.0)
MCV: 85.7 fL (ref 78.0–100.0)
MONOS PCT: 8 % (ref 3–12)
MPV: 9.1 fL (ref 8.6–12.4)
Monocytes Absolute: 0.5 10*3/uL (ref 0.1–1.0)
NEUTROS PCT: 69 % (ref 43–77)
Neutro Abs: 4.5 10*3/uL (ref 1.7–7.7)
PLATELETS: 120 10*3/uL — AB (ref 150–400)
RBC: 5.04 MIL/uL (ref 4.22–5.81)
RDW: 15.1 % (ref 11.5–15.5)
WBC: 6.5 10*3/uL (ref 4.0–10.5)

## 2015-08-16 LAB — POCT GLYCOSYLATED HEMOGLOBIN (HGB A1C): Hemoglobin A1C: 9.7

## 2015-08-16 MED ORDER — METFORMIN HCL 1000 MG PO TABS: 1000.0000 mg | ORAL_TABLET | Freq: Two times a day (BID) | ORAL | Status: DC

## 2015-08-16 MED ORDER — CABERGOLINE 0.5 MG PO TABS
0.2500 mg | ORAL_TABLET | ORAL | Status: DC
Start: 2015-08-16 — End: 2015-11-17

## 2015-08-16 NOTE — Patient Instructions (Signed)
Bridging   Slowly raise buttocks from floor, keeping 3 part core tight. Hold 5 - 10 sec Repeat _10__ times per set. Do __1-3__ sets per session. Do _1-2___ sessions per day.  Strengthening: Wall Slide  Can add ball between knees. Leaning on wall, tighten 3 part core, slowly lower buttocks 10-12 inches. Hold __5-10__ seconds. Tighten thigh muscles and return. Repeat __10__ times per set. Do __1-2__ sets per session. Do _1-2_ sessions per day.   FUNCTIONAL MOBILITY: Lateral Step Up   Stand on Step sideways at the edge of the step. Touch heel down to floor. And return to step. Can hold rail.  __5-10_ reps per set, _1-2__ sets per day, Repeat leading with other leg.   engage abdominal core with all upper body exercises!! Resisted External Rotation: in Neutral - Bilateral   PALMS UP Sit or stand, tubing in both hands, elbows at sides, bent to 90, forearms forward. Pinch shoulder blades together and rotate forearms out. Keep elbows at sides. Repeat __10__ times per set. Do _2-3___ sets per session. Do _2-3___ sessions per day.   Low Row: Standing   Face anchor, feet shoulder width apart. Palms up, pull arms back, squeezing shoulder blades together down and back. Repeat 10__ times per set. Do 2-3__ sets per session. Do 2-3__ sessions per week. Anchor Height: Waist     Strengthening: Resisted Extension   Hold tubing in right hand, arm forward. Pull arm back, elbow straight. Repeat _10___ times per set. Do 2-3____ sets per session. Do 2-3____ sessions per day.  Ankle out to the side

## 2015-08-16 NOTE — Progress Notes (Signed)
HPI: William Webster is a 64 y.o. male who presents to [place]  today for "change doctors" - previously following with another PCP for Hx of the following  DM2 - Used to be on insulin but had trouble affording this, A1C improved with weight loss per the patient. Stopped Insluin over a year ago. He is taking Metformin and Pioglitazone. A1C 9.7 today, previously 7.0 per patient.   HYPERLIPIDEMIA - Has been on Rosuvastatin which has stabilized cholesterol.   HYPERPROLACTINEMIA - Hx pituitary adenoma/prolactinoma in 1997, taking Cabergoline, previously on Bromocriptine, no surgery done for this, initially "size of a ping pong ball" and this decreased in size over the course of a few years. Seen by neurologist in [place], [place]. Doesn't want surgery if he can avoid this. PCP has been prescribing Cabergoline, he hasn't seen a neurologist or endocrinologist in "awhile." Cant' remmeber last time imaging of brain was performed. No visual problems.   OBESITY - on Phentermine and Topamax from Dr T - knows that on the Phentermine is not a long term option for weight loss.   SEASONAL ALLERGIES - controlled with prn OTC medications  ARTHRITIS - Follows with Dr Darene Lamer.     Past medical, social and family history reviewed: Past Medical History  Diagnosis Date  . Hyperlipidemia   . Post concussive syndrome   . Diabetes mellitus without complication    Past Surgical History  Procedure Laterality Date  . Hemorrhoid surgery    . Tonsillectomy     Social History  Substance Use Topics  . Smoking status: Former Smoker    Types: Cigars  . Smokeless tobacco: Never Used     Comment: occassional cigar  . Alcohol Use: 0.0 oz/week    0 Standard drinks or equivalent per week     Comment: social 12-20 drinks a week   Family History  Problem Relation Age of Onset  . Cancer Mother     colon  . Heart failure Mother   . Diabetes Mother   . Hyperlipidemia Mother   .  Stroke Mother   . Stroke Father   . Hyperlipidemia Father   . Diabetes Maternal Grandfather   . Heart disease Maternal Grandfather   . Stroke Maternal Grandfather   . Diabetes Paternal Grandfather   . Heart disease Paternal Grandfather   . Stroke Paternal Grandfather     Current Outpatient Prescriptions  Medication Sig Dispense Refill  . cabergoline (DOSTINEX) 0.5 MG tablet Take 0.5 tablets (0.25 mg total) by mouth 2 (two) times a week. 10 tablet 1  . meloxicam (MOBIC) 15 MG tablet One tab PO qAM with breakfast for 2 weeks, then daily prn pain. 30 tablet 3  . phentermine (ADIPEX-P) 37.5 MG tablet 0.5 tab by mouth twice a day 30 tablet 0  . pioglitazone (ACTOS) 15 MG tablet Take 15 mg by mouth daily.    . rosuvastatin (CRESTOR) 40 MG tablet Take 40 mg by mouth daily.    Marland Kitchen topiramate (TOPAMAX) 50 MG tablet One half tab by mouth daily for a week, then one tab by mouth daily. 30 tablet 0  . Zinc Chelated 50 MG TABS Take by mouth.    . metFORMIN (GLUCOPHAGE) 1000 MG tablet Take 1 tablet (1,000 mg total) by mouth 2 (two) times daily with a meal. 180 tablet 3  . sitaGLIPtin (JANUVIA) 100 MG tablet Take 1 tablet (100 mg total) by mouth daily. 30 tablet 3   No current facility-administered medications for this  visit.   No Known Allergies   Review of Systems: CONSTITUTIONAL: Neg fever/chills, no unintentional weight changes HEAD/EYES/EARS/NOSE: No headache/vision change or hearing change CARDIAC: No chest pain/pressure/palpitations, no orthopnea RESPIRATORY: No cough/shortness of breath/wheeze GASTROINTESTINAL: No nausea/vomiting/abdominal pain/blood in stool/diarrhea/constipation MUSCULOSKELETAL: (+) myalgia/arthralgia, chronic GENITOURINARY: No incontinence, No abnormal genital bleeding/discharge SKIN: No rash/wounds/concerning lesions HEM/ONC: No easy bruising/bleeding, no abnormal lymph node PSYCHIATRIC: No concerns with depression/anxiety or sleep problems, PHQ2 negative   Exam:   BP 126/81 mmHg  Pulse 72  Ht 6' (1.829 m)  Wt 257 lb (116.574 kg)  BMI 34.85 kg/m2  SpO2 98% Constitutional: VSS, see above. General Appearance: alert, well-developed, well-nourished, NAD Eyes: Normal lids and conjunctive, non-icteric sclera, PERRLA Ears, Nose, Mouth, Throat: Normal external inspection ears/nares/mouth/lips/gums, Normal TM bilaterally, MMM, posterior pharynx without erythema/exudate Neck: No masses, trachea midline. No thyroid enlargement/tenderness/mass appreciated Respiratory: Normal respiratory effort. No dullness/hyper-resonance to percussion. Breath sounds normal, no wheeze/rhonchi/rales Cardiovascular: S1/S2 normal, no murmur/rub/gallop auscultated. No carotid bruit or JVD. No abdominal aortic bruit. Pedal pulse II/IV bilaterally DP and PT. No lower extremity edema. Gastrointestinal: Nontender, no masses. No hepatomegaly, no splenomegaly. No hernia appreciated. Rectal exam deferred.  Musculoskeletal: Gait normal. No clubbing/cyanosis of digits.  Neurological: No cranial nerve deficit on limited exam. Motor and sensation intact and symmetric Psychiatric: Normal judgment/insight. Normal mood and affect. Oriented x3.    Results for orders placed or performed in visit on 08/16/15 (from the past 72 hour(s))  POCT HgB A1C     Status: None   Collection Time: 08/16/15 11:08 AM  Result Value Ref Range   Hemoglobin A1C 9.7   CBC with Differential     Status: Abnormal   Collection Time: 08/16/15 12:06 PM  Result Value Ref Range   WBC 6.5 4.0 - 10.5 K/uL   RBC 5.04 4.22 - 5.81 MIL/uL   Hemoglobin 14.3 13.0 - 17.0 g/dL   HCT 43.2 39.0 - 52.0 %   MCV 85.7 78.0 - 100.0 fL   MCH 28.4 26.0 - 34.0 pg   MCHC 33.1 30.0 - 36.0 g/dL   RDW 15.1 11.5 - 15.5 %   Platelets 120 (L) 150 - 400 K/uL   MPV 9.1 8.6 - 12.4 fL   Neutrophils Relative % 69 43 - 77 %   Neutro Abs 4.5 1.7 - 7.7 K/uL   Lymphocytes Relative 22 12 - 46 %   Lymphs Abs 1.4 0.7 - 4.0 K/uL   Monocytes Relative 8  3 - 12 %   Monocytes Absolute 0.5 0.1 - 1.0 K/uL   Eosinophils Relative 1 0 - 5 %   Eosinophils Absolute 0.1 0.0 - 0.7 K/uL   Basophils Relative 0 0 - 1 %   Basophils Absolute 0.0 0.0 - 0.1 K/uL   Smear Review Criteria for review not met   COMPLETE METABOLIC PANEL WITH GFR     Status: Abnormal   Collection Time: 08/16/15 12:08 PM  Result Value Ref Range   Sodium 139 135 - 146 mmol/L   Potassium 5.6 (H) 3.5 - 5.3 mmol/L    Comment: No visible hemolysis.   Chloride 102 98 - 110 mmol/L   CO2 27 20 - 31 mmol/L   Glucose, Bld 213 (H) 65 - 99 mg/dL   BUN 21 7 - 25 mg/dL   Creat 1.00 0.70 - 1.25 mg/dL   Total Bilirubin 2.0 (H) 0.2 - 1.2 mg/dL   Alkaline Phosphatase 55 40 - 115 U/L   AST 22 10 - 35 U/L  ALT 26 9 - 46 U/L   Total Protein 6.9 6.1 - 8.1 g/dL   Albumin 4.6 3.6 - 5.1 g/dL   Calcium 9.9 8.6 - 10.3 mg/dL   GFR, Est African American >89 >=60 mL/min   GFR, Est Non African American 80 >=60 mL/min    Comment:   The estimated GFR is a calculation valid for adults (>=40 years old) that uses the CKD-EPI algorithm to adjust for age and sex. It is   not to be used for children, pregnant women, hospitalized patients,    patients on dialysis, or with rapidly changing kidney function. According to the NKDEP, eGFR >89 is normal, 60-89 shows mild impairment, 30-59 shows moderate impairment, 15-29 shows severe impairment and <15 is ESRD.     Lipid panel     Status: Abnormal   Collection Time: 08/16/15 12:08 PM  Result Value Ref Range   Cholesterol 155 125 - 200 mg/dL   Triglycerides 174 (H) <150 mg/dL   HDL 58 >=40 mg/dL   Total CHOL/HDL Ratio 2.7 <=5.0 Ratio   VLDL 35 (H) <30 mg/dL   LDL Cholesterol 62 <130 mg/dL    Comment:   Total Cholesterol/HDL Ratio:CHD Risk                        Coronary Heart Disease Risk Table                                        Men       Women          1/2 Average Risk              3.4        3.3              Average Risk              5.0         4.4           2X Average Risk              9.6        7.1           3X Average Risk             23.4       11.0 Use the calculated Patient Ratio above and the CHD Risk table  to determine the patient's CHD Risk.   TSH     Status: None   Collection Time: 08/16/15 12:08 PM  Result Value Ref Range   TSH 0.862 0.350 - 4.500 uIU/mL  Hepatitis C antibody     Status: None   Collection Time: 08/16/15 12:08 PM  Result Value Ref Range   HCV Ab NEGATIVE NEGATIVE  HIV antibody     Status: None   Collection Time: 08/16/15 12:08 PM  Result Value Ref Range   HIV 1&2 Ab, 4th Generation NONREACTIVE NONREACTIVE    Comment:   HIV-1 antigen and HIV-1/HIV-2 antibodies were not detected.  There is no laboratory evidence of HIV infection.   HIV-1/2 Antibody Diff        Not indicated. HIV-1 RNA, Qual TMA          Not indicated.     PLEASE NOTE: This information has been disclosed to you from records whose confidentiality may be protected by state  law. If your state requires such protection, then the state law prohibits you from making any further disclosure of the information without the specific written consent of the person to whom it pertains, or as otherwise permitted by law. A general authorization for the release of medical or other information is NOT sufficient for this purpose.   The performance of this assay has not been clinically validated in patients less than 76 years old.   For additional information please refer to http://education.questdiagnostics.com/faq/FAQ106.  (This link is being provided for informational/educational purposes only.)     Prolactin     Status: Abnormal   Collection Time: 08/16/15 12:08 PM  Result Value Ref Range   Prolactin 18.3 (H) 2.1 - 17.1 ng/mL    Comment:      Reference Ranges:                  Male:                       2.1 -  17.1 ng/ml                  Male:   Pregnant          9.7 - 208.5 ng/mL                            Non Pregnant      2.8 -   29.2 ng/mL                            Post Menopausal   1.8 -  20.3 ng/mL                      Sed Rate (ESR)     Status: None   Collection Time: 08/16/15 12:08 PM  Result Value Ref Range   Sed Rate 4 0 - 20 mm/hr  HgB A1c     Status: Abnormal   Collection Time: 08/16/15 12:08 PM  Result Value Ref Range   Hgb A1c MFr Bld 10.1 (H) <5.7 %    Comment:                                                                        According to the ADA Clinical Practice Recommendations for 2011, when HbA1c is used as a screening test:     >=6.5%   Diagnostic of Diabetes Mellitus            (if abnormal result is confirmed)   5.7-6.4%   Increased risk of developing Diabetes Mellitus   References:Diagnosis and Classification of Diabetes Mellitus,Diabetes VOHY,0737,10(GYIRS 1):S62-S69 and Standards of Medical Care in         Diabetes - 2011,Diabetes Care,2011,34 (Suppl 1):S11-S61.      Mean Plasma Glucose 243 (H) <117 mg/dL  Urinalysis with Culture Reflex     Status: Abnormal   Collection Time: 08/16/15 12:08 PM  Result Value Ref Range   Color, Urine YELLOW YELLOW    Comment: ** Please note change in unit of measure and reference range(s). **      APPearance TURBID (A) CLEAR  Specific Gravity, Urine 1.023 1.001 - 1.035   pH 5.5 5.0 - 8.0   Glucose, UA TRACE (A) NEGATIVE   Bilirubin Urine NEGATIVE NEGATIVE   Ketones, ur NEGATIVE NEGATIVE   Hgb urine dipstick NEGATIVE NEGATIVE   Protein, ur NEGATIVE NEGATIVE   Nitrite NEGATIVE NEGATIVE   Leukocytes, UA NEGATIVE NEGATIVE   WBC, UA NONE SEEN <=5 WBC/HPF   RBC / HPF NONE SEEN <=2 RBC/HPF   Squamous Epithelial / LPF NONE SEEN <=5 HPF   Bacteria, UA NONE SEEN NONE SEEN HPF   Crystals NONE SEEN NONE SEEN HPF   Casts NONE SEEN NONE SEEN LPF   Yeast NONE SEEN NONE SEEN HPF    Comment:   Footnotes:  (1) ** Please note change in unit of measure and reference range(s). **     Microalbumin / creatinine urine ratio     Status: None    Collection Time: 08/16/15 12:08 PM  Result Value Ref Range   Microalb, Ur 2.0 <2.0 mg/dL    Comment: The ADA (Diabetes Care 0347;42(VZDGL 1):S14-S80) has defined abnormalities in albumin excretion as follows:            Category           Result                            (mg/g creatinine)                 Normal:    <30       Microalbuminuria:    30 - 299   Clinical albuminuria:    > or = 300    The ADA recommends that at least two of three specimens collected within a 3 - 6 month period be abnormal before considering a patient to be within a diagnostic category.    Creatinine, Urine 229.5 mg/dL    Comment: No reference range established.   Microalb Creat Ratio 8.7 0.0 - 30.0 mg/g   No results found.   ASSESSMENT/PLAN:  Type 2 diabetes mellitus without complication - Plan: POCT HgB A1C, CBC With Differential, COMPLETE METABOLIC PANEL WITH GFR, Lipid panel, TSH, HgB A1c, Urinalysis with Culture Reflex, Microalbumin / creatinine urine ratio, CBC with Differential, metFORMIN (GLUCOPHAGE) 1000 MG tablet, sitaGLIPtin (JANUVIA) 100 MG tablet - Discusse dimportance of Glc control, fdiscussed natural history of diabetes disease and likelihood he will need insulin. He is adamant he doesn't want to be on insulin without maximizing therapy on oral medications. I advised of risks and he is aware, we will try triple po therapy and lifestyle modifications but if these don't result in dramatic improvement I will have to insist he get back  On Inuslin. He agrees.   HLD (hyperlipidemia) - cont statin  Obesity - cont Phentermine/Topamax, lifestyle changes, dietary changes discussed  Screening for HIV (human immunodeficiency virus) - Plan: HIV antibody  Need for hepatitis C screening test - Plan: Hepatitis C antibody  Pituitary adenoma - Previously folloewd in [place], referred to Endocrine - Plan: Prolactin, Sed Rate (ESR) on meds, Ambulatory referral to Endocrinology  Hyperprolactinemia - Plan:  Prolactin, Sed Rate (ESR), cabergoline (DOSTINEX) 0.5 MG tablet, Ambulatory referral to Endocrinology  Medication management - Plan: Prolactin, Sed Rate (ESR), Ambulatory referral to Endocrinology  Hyperkalemia - will followup in one week for repeat lab, suspect due to meds/diabetes or transient effect, will need close followup and if still high will investigate further.

## 2015-08-16 NOTE — Therapy (Signed)
[facility] [address] [address], [place], [address] Phone: [phone]   Fax:  [phone]  Physical Therapy Treatment  Patient Details  Name: William Webster MRN: [id number] Date of Birth: 10-May-1951 Referring Provider:  Silverio Decamp,*  Encounter Date: 08/16/2015      PT End of Session - 08/16/15 1157    Visit Number 2   Number of Visits 6   Date for PT Re-Evaluation 09/20/15   PT Start Time 1110   PT Stop Time 1156   PT Time Calculation (min) 46 min   Activity Tolerance Patient tolerated treatment well      Past Medical History  Diagnosis Date  . Hyperlipidemia   . Post concussive syndrome   . Diabetes mellitus without complication     Past Surgical History  Procedure Laterality Date  . Hemorrhoid surgery    . Tonsillectomy      There were no vitals filed for this visit.  Visit Diagnosis:  Spondylosis of lumbar region without myelopathy or radiculopathy  Stiffness in joint  Weakness of both legs  Midline low back pain without sciatica      Subjective Assessment - 08/16/15 1112    Subjective Bill reports that he has done some exercise since he was here = maybe not as much as he should have = has also been doing some exercise in the water   Currently in Pain? Yes   Pain Score 1    Pain Location Back   Pain Orientation Mid;Lower   Pain Descriptors / Indicators Aching   Pain Onset More than a month ago   Pain Frequency Intermittent                         [facility] Adult PT Treatment/Exercise - 08/16/15 0001    Lumbar Exercises: Stretches   Passive Hamstring Stretch 3 reps;30 seconds   Passive Hamstring Stretch Limitations opposite knee bent; with strap   ITB Stretch 3 reps;30 seconds   ITB Stretch Limitations with strap   Piriformis Stretch 3 reps;30 seconds   Piriformis Stretch Limitations supine with strap   Lumbar Exercises: Aerobic   Stationary Bike Nustep L3  5 min   Lumbar Exercises: Standing   Wall Slides 10 reps;5 seconds   Wall Slides Limitations with ball for adduction    Scapular Retraction Both;Strengthening;10 reps;Theraband   Theraband Level (Scapular Retraction) Level 2 (Red)   Row Both;Strengthening;10 reps;Theraband   Theraband Level (Row) Level 2 (Red)   Shoulder Extension Both;Strengthening;10 reps;Theraband   Theraband Level (Shoulder Extension) Level 2 (Red)   Lumbar Exercises: Supine   Ab Set 10 reps  10 sec hold   AB Set Limitations 3 part core   Bridge 10 reps;5 seconds   Bridge Limitations with adduction    Other Supine Lumbar Exercises ankle DF red TB seated 10 reps                PT Education - 08/16/15 1156    Education provided Yes   Education Details core stabilization HEP   Person(s) Educated Patient   Methods Explanation;Demonstration;Tactile cues;Verbal cues;Handout   Comprehension Verbalized understanding;Returned demonstration;Verbal cues required;Tactile cues required             PT Long Term Goals - 08/16/15 1201    PT LONG TERM GOAL #1   Title Instruct patient in HEP for discharge - 09/22/15   Time 6   Period Weeks   Status On-going  PT LONG TERM GOAL #2   Title Increase hamstring flexibility bo 85 degrees bilat 09/22/15   Time 6   Period Weeks   Status On-going   PT LONG TERM GOAL #3   Title Increase strength to 5/5 bilat hip extension 09/22/15   Time 6   Period Weeks   Status On-going   PT LONG TERM GOAL #4   Title Encourage consistent HEP and increased activity level. Patient to report physical activity of at least 30 min 3-4 times/week 09/22/15   Time 6   Period Weeks   Status On-going   PT LONG TERM GOAL #5   Title Decrease FOTO to </=28% limitation   Time 6   Period Weeks   Status On-going               Plan - 08/16/15 1158    Clinical Impression Statement Rush Landmark has done some exercise at home and is trying to increase his physical activity level in  general. Will go one week at a time with therapy. Wants to learn his HEP so he can work independently. Demonstrates increased HS flexibility today. Tolerated exercise in the clinic with some fatigue through LE's. Understands that he should not overdo the exercises - he should gradually progress with exercises.   Pt will benefit from skilled therapeutic intervention in order to improve on the following deficits Decreased range of motion;Decreased mobility;Decreased strength;Decreased activity tolerance;Pain   Rehab Potential Good   PT Frequency 1x / week   PT Duration 6 weeks   PT Treatment/Interventions Patient/family education;ADLs/Self Care Home Management;Therapeutic exercise;Therapeutic activities;Neuromuscular re-education;Cryotherapy;Manual techniques   PT Next Visit Plan review HEP; progress with core stabilizaiton and education - patient will check on ordering a TENS unit for home. (has 20% co-pay and wants to decrease cost for therapy) no modalities in the clinic   PT Home Exercise Plan core stabilizaiton; LE stretching   Consulted and Agree with Plan of Care Patient        Problem List Patient Active Problem List   Diagnosis Date Noted  . Obesity 07/15/2015  . Spondylosis of lumbar region without myelopathy or radiculopathy 07/15/2015  . Primary osteoarthritis of both knees 07/15/2015  . Right cervical radiculopathy 01/07/2015  . HLD (hyperlipidemia) 01/05/2014  . Diabetes 01/05/2014    Hunt Zajicek Nilda Simmer, PT, MPH 08/16/2015, 12:07 PM  [facility] [place], [place], [address] Phone: [phone]   Fax:  [phone]

## 2015-08-16 NOTE — Patient Instructions (Signed)
Diabetes Mellitus and Food It is important for you to manage your blood sugar (glucose) level. Your blood glucose level can be greatly affected by what you eat. Eating healthier foods in the appropriate amounts throughout the day at about the same time each day will help you control your blood glucose level. It can also help slow or prevent worsening of your diabetes mellitus. Healthy eating may even help you improve the level of your blood pressure and reach or maintain a healthy weight.  HOW CAN FOOD AFFECT ME? Carbohydrates Carbohydrates affect your blood glucose level more than any other type of food. Your dietitian will help you determine how many carbohydrates to eat at each meal and teach you how to count carbohydrates. Counting carbohydrates is important to keep your blood glucose at a healthy level, especially if you are using insulin or taking certain medicines for diabetes mellitus. Alcohol Alcohol can cause sudden decreases in blood glucose (hypoglycemia), especially if you use insulin or take certain medicines for diabetes mellitus. Hypoglycemia can be a life-threatening condition. Symptoms of hypoglycemia (sleepiness, dizziness, and disorientation) are similar to symptoms of having too much alcohol.  If your health care provider has given you approval to drink alcohol, do so in moderation and use the following guidelines:  Women should not have more than one drink per day, and men should not have more than two drinks per day. One drink is equal to:  12 oz of beer.  5 oz of wine.  1 oz of hard liquor.  Do not drink on an empty stomach.  Keep yourself hydrated. Have water, diet soda, or unsweetened iced tea.  Regular soda, juice, and other mixers might contain a lot of carbohydrates and should be counted. WHAT FOODS ARE NOT RECOMMENDED? As you make food choices, it is important to remember that all foods are not the same. Some foods have fewer nutrients per serving than other  foods, even though they might have the same number of calories or carbohydrates. It is difficult to get your body what it needs when you eat foods with fewer nutrients. Examples of foods that you should avoid that are high in calories and carbohydrates but low in nutrients include:  Trans fats (most processed foods list trans fats on the Nutrition Facts label).  Regular soda.  Juice.  Candy.  Sweets, such as cake, pie, doughnuts, and cookies.  Fried foods. WHAT FOODS CAN I EAT? Have nutrient-rich foods, which will nourish your body and keep you healthy. The food you should eat also will depend on several factors, including:  The calories you need.  The medicines you take.  Your weight.  Your blood glucose level.  Your blood pressure level.  Your cholesterol level. You also should eat a variety of foods, including:  Protein, such as meat, poultry, fish, tofu, nuts, and seeds (lean animal proteins are best).  Fruits.  Vegetables.  Dairy products, such as milk, cheese, and yogurt (low fat is best).  Breads, grains, pasta, cereal, rice, and beans.  Fats such as olive oil, trans fat-free margarine, canola oil, avocado, and olives. DOES EVERYONE WITH DIABETES MELLITUS HAVE THE SAME MEAL PLAN? Because every person with diabetes mellitus is different, there is not one meal plan that works for everyone. It is very important that you meet with a dietitian who will help you create a meal plan that is just right for you. Document Released: 09/07/2005 Document Revised: 12/16/2013 Document Reviewed: 11/07/2013 [employer] Patient Information 2015 [employer]. This   information is not intended to replace advice given to you by your health care provider. Make sure you discuss any questions you have with your health care provider.  

## 2015-08-17 DIAGNOSIS — D352 Benign neoplasm of pituitary gland: Secondary | ICD-10-CM | POA: Insufficient documentation

## 2015-08-17 DIAGNOSIS — E221 Hyperprolactinemia: Secondary | ICD-10-CM | POA: Insufficient documentation

## 2015-08-17 LAB — COMPLETE METABOLIC PANEL WITH GFR
ALBUMIN: 4.6 g/dL (ref 3.6–5.1)
ALT: 26 U/L (ref 9–46)
AST: 22 U/L (ref 10–35)
Alkaline Phosphatase: 55 U/L (ref 40–115)
BUN: 21 mg/dL (ref 7–25)
CHLORIDE: 102 mmol/L (ref 98–110)
CO2: 27 mmol/L (ref 20–31)
Calcium: 9.9 mg/dL (ref 8.6–10.3)
Creat: 1 mg/dL (ref 0.70–1.25)
GFR, Est African American: >89 mL/min (ref >=60)
GFR, Est Non African American: 80 mL/min (ref >=60)
GLUCOSE: 213 mg/dL — AB (ref 65–99)
POTASSIUM: 5.6 mmol/L — AB (ref 3.5–5.3)
SODIUM: 139 mmol/L (ref 135–146)
Total Bilirubin: 2 mg/dL — ABNORMAL HIGH (ref 0.2–1.2)
Total Protein: 6.9 g/dL (ref 6.1–8.1)

## 2015-08-17 LAB — URINALYSIS W MICROSCOPIC + REFLEX CULTURE
Bacteria, UA: NONE SEEN [HPF]
Bilirubin Urine: NEGATIVE
Casts: NONE SEEN [LPF]
Crystals: NONE SEEN [HPF]
HGB URINE DIPSTICK: NEGATIVE
KETONES UR: NEGATIVE
LEUKOCYTES UA: NEGATIVE
NITRITE: NEGATIVE
Protein, ur: NEGATIVE
RBC / HPF: NONE SEEN RBC/HPF (ref <=2)
SQUAMOUS EPITHELIAL / LPF: NONE SEEN [HPF] (ref <=5)
Specific Gravity, Urine: 1.023 (ref 1.001–1.035)
WBC UA: NONE SEEN WBC/HPF (ref <=5)
YEAST: NONE SEEN [HPF]
pH: 5.5 (ref 5.0–8.0)

## 2015-08-17 LAB — LIPID PANEL
CHOL/HDL RATIO: 2.7 ratio (ref <=5.0)
Cholesterol: 155 mg/dL (ref 125–200)
HDL: 58 mg/dL (ref >=40)
LDL Cholesterol: 62 mg/dL (ref <130)
Triglycerides: 174 mg/dL — ABNORMAL HIGH (ref <150)
VLDL: 35 mg/dL — AB (ref <30)

## 2015-08-17 LAB — MICROALBUMIN / CREATININE URINE RATIO
Creatinine, Urine: 229.5 mg/dL
MICROALB/CREAT RATIO: 8.7 mg/g (ref 0.0–30.0)
Microalb, Ur: 2 mg/dL (ref <2.0)

## 2015-08-17 LAB — HEPATITIS C ANTIBODY: HCV AB: NEGATIVE

## 2015-08-17 LAB — SEDIMENTATION RATE: Sed Rate: 4 mm/hr (ref 0–20)

## 2015-08-17 LAB — HEMOGLOBIN A1C
Hgb A1c MFr Bld: 10.1 % — ABNORMAL HIGH (ref <5.7)
Mean Plasma Glucose: 243 mg/dL — ABNORMAL HIGH (ref <117)

## 2015-08-17 LAB — TSH: TSH: 0.862 u[IU]/mL (ref 0.350–4.500)

## 2015-08-17 LAB — PROLACTIN: Prolactin: 18.3 ng/mL — ABNORMAL HIGH (ref 2.1–17.1)

## 2015-08-17 LAB — HIV ANTIBODY (ROUTINE TESTING W REFLEX): HIV 1&2 Ab, 4th Generation: NONREACTIVE

## 2015-08-17 MED ORDER — SITAGLIPTIN PHOSPHATE 100 MG PO TABS: 100.0000 mg | ORAL_TABLET | Freq: Every day | ORAL | Status: DC

## 2015-08-18 ENCOUNTER — Encounter: Payer: Self-pay | Admitting: Osteopathic Medicine

## 2015-08-23 ENCOUNTER — Ambulatory Visit (INDEPENDENT_AMBULATORY_CARE_PROVIDER_SITE_OTHER): Payer: Managed Care, Other (non HMO) | Admitting: Osteopathic Medicine

## 2015-08-23 VITALS — BP 115/78 | HR 83 | Wt 253.0 lb

## 2015-08-23 DIAGNOSIS — E875 Hyperkalemia: Secondary | ICD-10-CM | POA: Diagnosis not present

## 2015-08-23 DIAGNOSIS — E119 Type 2 diabetes mellitus without complications: Secondary | ICD-10-CM | POA: Diagnosis not present

## 2015-08-23 DIAGNOSIS — E785 Hyperlipidemia, unspecified: Secondary | ICD-10-CM

## 2015-08-23 DIAGNOSIS — Z1211 Encounter for screening for malignant neoplasm of colon: Secondary | ICD-10-CM | POA: Diagnosis not present

## 2015-08-23 DIAGNOSIS — Z23 Encounter for immunization: Secondary | ICD-10-CM | POA: Diagnosis not present

## 2015-08-23 DIAGNOSIS — E669 Obesity, unspecified: Secondary | ICD-10-CM

## 2015-08-23 DIAGNOSIS — E221 Hyperprolactinemia: Secondary | ICD-10-CM

## 2015-08-23 MED ORDER — ZOSTER VACCINE LIVE 19400 UNT/0.65ML ~~LOC~~ SOLR
0.6500 mL | Freq: Once | SUBCUTANEOUS | Status: AC
  Administered 2015-08-23: 19400 [IU] via SUBCUTANEOUS

## 2015-08-23 MED ORDER — INFLUENZA VAC SPLIT QUAD 0.5 ML IM SUSY
0.5000 mL | PREFILLED_SYRINGE | Freq: Once | INTRAMUSCULAR | Status: AC
  Administered 2015-08-23: 0.5 mL via INTRAMUSCULAR

## 2015-08-23 NOTE — Progress Notes (Signed)
HPI: William Webster is a 64 y.o. male who presents to [facility]  today for "change doctors" - previously following with another PCP for Hx of the following  DM2 - Used to be on insulin but had trouble affording this, A1C improved with weight loss per the patient. Stopped Insluin over a year ago. He is taking Metformin and Pioglitazone, recently started Januvia. Needs testing strips and lancets. Hasn't been checking Glc at home. Reports he has cut back on beer consumption.   HYPERLIPIDEMIA - Has been on Rosuvastatin which has stabilized cholesterol. Labs reviewed.   HYPERPROLACTINEMIA/HX PROLACTINOMA - Has appointment upcoming with endocrinology.   OBESITY - on Phentermine and Topamax from Dr T, limited success with weight loss. Has questions about possible referral for bariatric surgery.   ARTHRITIS - Follows with Dr T.     Past medical, social and family history reviewed: Past Medical History  Diagnosis Date  . Hyperlipidemia   . Post concussive syndrome   . Diabetes mellitus without complication   . Gilbert disease   . Spondylosis of lumbar joint   . Prolactinoma   . Anterior pituitary adenoma syndrome   . Colon polyp   . Family history of cancer of GI tract   . Lumbar scoliosis   . Low back pain   . Family history of colon cancer   . Diabetic neuropathy   . Sciatica of right side    Past Surgical History  Procedure Laterality Date  . Hemorrhoid surgery    . Tonsillectomy     Social History  Substance Use Topics  . Smoking status: Former Smoker    Types: Cigars  . Smokeless tobacco: Never Used     Comment: occassional cigar  . Alcohol Use: 6.0 oz/week    10 Standard drinks or equivalent per week     Comment: social 12-20 drinks a week   Family History  Problem Relation Age of Onset  . Cancer Mother     colon  . Heart failure Mother   . Diabetes Mother   . Hyperlipidemia Mother   . Stroke Mother   . Stroke Father   .  Hyperlipidemia Father   . Diabetes Maternal Grandfather   . Heart disease Maternal Grandfather   . Stroke Maternal Grandfather   . Diabetes Paternal Grandfather   . Heart disease Paternal Grandfather   . Stroke Paternal Grandfather     Current Outpatient Prescriptions  Medication Sig Dispense Refill  . cabergoline (DOSTINEX) 0.5 MG tablet Take 0.5 tablets (0.25 mg total) by mouth 2 (two) times a week. 10 tablet 1  . meloxicam (MOBIC) 15 MG tablet One tab PO qAM with breakfast for 2 weeks, then daily prn pain. 30 tablet 3  . metFORMIN (GLUCOPHAGE) 1000 MG tablet Take 1 tablet (1,000 mg total) by mouth 2 (two) times daily with a meal. 180 tablet 3  . phentermine (ADIPEX-P) 37.5 MG tablet 0.5 tab by mouth twice a day 30 tablet 0  . pioglitazone (ACTOS) 15 MG tablet Take 15 mg by mouth daily.    . rosuvastatin (CRESTOR) 40 MG tablet Take 40 mg by mouth daily.    . sitaGLIPtin (JANUVIA) 100 MG tablet Take 1 tablet (100 mg total) by mouth daily. 30 tablet 3  . topiramate (TOPAMAX) 50 MG tablet One half tab by mouth daily for a week, then one tab by mouth daily. 30 tablet 0  . Zinc Chelated 50 MG TABS Take by mouth.  No current facility-administered medications for this visit.   No Known Allergies   Review of Systems: CONSTITUTIONAL: Neg fever/chills, no unintentional weight changes HEAD/EYES/EARS/NOSE: No headache/vision change or hearing change CARDIAC: No chest pain/pressure/palpitations, no orthopnea RESPIRATORY: No cough/shortness of breath/wheeze GASTROINTESTINAL: No nausea/vomiting/abdominal pain/blood in stool/diarrhea/constipation MUSCULOSKELETAL: (+) myalgia/arthralgia, chronic GENITOURINARY: No incontinence, No abnormal genital bleeding/discharge SKIN: No rash/wounds/concerning lesions HEM/ONC: No easy bruising/bleeding, no abnormal lymph node PSYCHIATRIC: No concerns with depression/anxiety or sleep problems, PHQ2 negative   Exam:  BP 115/78 mmHg  Pulse 83  Wt 253 lb  (114.76 kg)  SpO2 96% Constitutional: VSS, see above. General Appearance: alert, well-developed, well-nourished, NAD Respiratory: Normal respiratory effort. Breath sounds normal, no wheeze/rhonchi/rales Cardiovascular: S1/S2 normal, RRR no murmur/rub/gallop auscultated. No carotid bruit or JVD. No abdominal aortic bruit. Pedal pulse II/IV bilaterally DP and PT. No lower extremity edema. Gastrointestinal: Nontender, no masses. No hepatomegaly, no splenomegaly. No hernia appreciated. Rectal exam deferred.  Musculoskeletal: Gait normal. No clubbing/cyanosis of digits.  Neurological: No cranial nerve deficit on limited exam. Motor and sensation intact and symmetric Psychiatric: Normal judgment/insight. Normal mood and affect. Oriented x3.     No results found for this or any previous visit (from the past 72 hour(s)). No results found.   ASSESSMENT/PLAN:  Type 2 diabetes mellitus without complication - patient counseled on diet and exercise as tolerated, we'll repeat A1c in the next 3 months, encouraged current dietary changes, written prescription given for testing supplies. Counseled again that he will most likely need to be on insulin if his A1c does not improve.    Hyperlipidemia - LDL is at goal in diabetic patient, continue statin  Obesity - diet and exercise as tolerated, continue phentermine and Topamax, if no improvement may consider referral to bariatric surgery as discussed  Hyperkalemia, mild - Plan: BASIC METABOLIC PANEL WITH GFR, repeat K today with BMP, likely was mild elevated due to hyperglycemia, if confirmed high/worse will bring back for further workup, no concerning cardiac symptoms.    Colon cancer screening - Plan: Ambulatory referral to Gastroenterology  Hyperprolactinemia - await consultation with endocrinology  Need for immunization against influenza - Plan: Influenza vac split quadrivalent PF (FLUARIX) injection 0.5 mL  Need for zoster vaccination - Plan: zoster  vaccine live (PF) (ZOSTAVAX) injection 19,400 Units

## 2015-08-24 ENCOUNTER — Encounter: Payer: Managed Care, Other (non HMO) | Admitting: Rehabilitative and Restorative Service Providers"

## 2015-08-24 LAB — BASIC METABOLIC PANEL WITH GFR
BUN: 23 mg/dL (ref 7–25)
CALCIUM: 9.7 mg/dL (ref 8.6–10.3)
CHLORIDE: 104 mmol/L (ref 98–110)
CO2: 25 mmol/L (ref 20–31)
CREATININE: 1.08 mg/dL (ref 0.70–1.25)
GFR, Est African American: 85 mL/min (ref >=60)
GFR, Est Non African American: 73 mL/min (ref >=60)
Glucose, Bld: 147 mg/dL — ABNORMAL HIGH (ref 65–99)
Potassium: 5.3 mmol/L (ref 3.5–5.3)
SODIUM: 139 mmol/L (ref 135–146)

## 2015-08-27 ENCOUNTER — Ambulatory Visit (INDEPENDENT_AMBULATORY_CARE_PROVIDER_SITE_OTHER): Payer: Managed Care, Other (non HMO) | Admitting: Physical Therapy

## 2015-08-27 DIAGNOSIS — M47816 Spondylosis without myelopathy or radiculopathy, lumbar region: Secondary | ICD-10-CM | POA: Diagnosis not present

## 2015-08-27 DIAGNOSIS — M2560 Stiffness of unspecified joint, not elsewhere classified: Secondary | ICD-10-CM

## 2015-08-27 DIAGNOSIS — M545 Low back pain, unspecified: Secondary | ICD-10-CM

## 2015-08-27 DIAGNOSIS — R29898 Other symptoms and signs involving the musculoskeletal system: Secondary | ICD-10-CM

## 2015-08-27 NOTE — Therapy (Addendum)
[facility] [place], [place], [address] Phone: [phone]   Fax:  [phone]  Physical Therapy Treatment  Patient Details  Name: William Webster MRN: [id number] Date of Birth: 08-Dec-1951 Referring Provider:  Silverio Decamp,*  Encounter Date: 08/27/2015      PT End of Session - 08/27/15 1028    Visit Number 3   Number of Visits 6   Date for PT Re-Evaluation 09/20/15   PT Start Time 1020   PT Stop Time 1101   PT Time Calculation (min) 41 min   Activity Tolerance Patient tolerated treatment well;No increased pain      Past Medical History  Diagnosis Date  . Hyperlipidemia   . Post concussive syndrome   . Diabetes mellitus without complication   . Gilbert disease   . Spondylosis of lumbar joint   . Prolactinoma   . Anterior pituitary adenoma syndrome   . Colon polyp   . Family history of cancer of GI tract   . Lumbar scoliosis   . Low back pain   . Family history of colon cancer   . Diabetic neuropathy   . Sciatica of right side     Past Surgical History  Procedure Laterality Date  . Hemorrhoid surgery    . Tonsillectomy      There were no vitals filed for this visit.  Visit Diagnosis:  Spondylosis of lumbar region without myelopathy or radiculopathy  Stiffness in joint  Weakness of both legs  Midline low back pain without sciatica      Subjective Assessment - 08/27/15 1025    Subjective Pt reports he has been doing the HEP and plans to increase exercise with use of personal trainer/neighbor.    Currently in Pain? Yes   Pain Score 1    Pain Location Back   Pain Orientation Mid;Lower   Pain Descriptors / Indicators Nagging   Aggravating Factors  lifting, bending forward    Pain Relieving Factors lyning down on back and bringing knees to chest.             [facility] PT Assessment - 08/27/15 0001    Assessment   Medical Diagnosis LBP   Onset Date/Surgical Date  02/09/15   Hand Dominance Right   Next MD Visit 09/12/15   Prior Therapy for cervical spine          [facility] Adult PT Treatment/Exercise - 08/27/15 0001    Lumbar Exercises: Stretches   Passive Hamstring Stretch 2 reps;60 seconds   Passive Hamstring Stretch Limitations opposite leg straight (with strap on foot)    Single Knee to Chest Stretch 1 rep;20 seconds   ITB Stretch 1 rep;30 seconds   Piriformis Stretch 1 rep;30 seconds   Lumbar Exercises: Aerobic   Stationary Bike NuStep L5: 5.5 min    Lumbar Exercises: Standing   Wall Slides 10 reps;5 seconds   Row Both;Strengthening;10 reps;Theraband   Theraband Level (Row) Level 2 (Red)   Shoulder Extension Strengthening;Both;10 reps   Theraband Level (Shoulder Extension) Level 2 (Red)   Lumbar Exercises: Supine   Ab Set 10 reps;5 seconds   Clam 10 reps  with abd set, each leg   Heel Slides 10 reps  each leg with abd set   Bent Knee Raise 10 reps  each leg, with abd set   Bridge 10 reps            PT Long Term Goals - 08/27/15 1218    PT  LONG TERM GOAL #1   Title Instruct patient in HEP for discharge - 09/22/15   Time 6   Period Weeks   Status On-going   PT LONG TERM GOAL #2   Title Increase hamstring flexibility by 85 degrees bilat 09/22/15   Time 6   Period Weeks   Status Achieved   PT LONG TERM GOAL #3   Title Increase strength to 5/5 bilat hip extension 09/22/15   Time 6   Period Weeks   Status On-going   PT LONG TERM GOAL #4   Title Encourage consistent HEP and increased activity level. Patient to report physical activity of at least 30 min 3-4 times/week 09/22/15   Time 6   Period Weeks   Status On-going   PT LONG TERM GOAL #5   Title Decrease FOTO to </=28% limitation   Time 6   Period Weeks   Status On-going               Plan - 08/27/15 1105    Clinical Impression Statement Pt tolerated all exercises without increase in pain. Pt required frequent cues to engage core during exercises.  Pt  demo improved HS flexibility today; has met LTG #2.  Pt progressing well towards remaining goals.    Pt will benefit from skilled therapeutic intervention in order to improve on the following deficits Decreased range of motion;Decreased mobility;Decreased strength;Decreased activity tolerance;Pain   Rehab Potential Good   PT Frequency 1x / week   PT Duration 6 weeks   PT Treatment/Interventions Patient/family education;ADLs/Self Care Home Management;Therapeutic exercise;Therapeutic activities;Neuromuscular re-education;Cryotherapy;Manual techniques   PT Next Visit Plan Pt interested in holding therapy for 2 wks and will check back in after MD appt on 09/10/15.  At that time will assess need for further therapy vs. d/c.    Consulted and Agree with Plan of Care Patient        Problem List Patient Active Problem List   Diagnosis Date Noted  . Pituitary adenoma 08/17/2015  . Hyperprolactinemia 08/17/2015  . Obesity 07/15/2015  . Spondylosis of lumbar region without myelopathy or radiculopathy 07/15/2015  . Primary osteoarthritis of both knees 07/15/2015  . Right cervical radiculopathy 01/07/2015  . HLD (hyperlipidemia) 01/05/2014  . Diabetes 01/05/2014    Kerin Perna, PTA 08/27/2015 12:18 PM  [facility] [place], [place], [address] Phone: [phone]   Fax:  [phone]     PHYSICAL THERAPY DISCHARGE SUMMARY  Visits from Start of Care: 3  Current functional level related to goals / functional outcomes: Patient pleased with level of function. Goals of therpay partially accomplished.   Remaining deficits: Some limitations    Education / Equipment: HEP  Plan: Patient agrees to discharge.  Patient goals were partially met. Patient is being discharged due to the patient's request.  ?????     Celyn P. Helene Kelp PT, MPH 09/23/2015 2:09 PM

## 2015-08-27 NOTE — Patient Instructions (Signed)
  Abdominal Bracing With Pelvic Floor (Hook-Lying)   With neutral spine, tighten pelvic floor and abdominals. Hold 10 seconds. Repeat __10_ times. Do _1__ times a day.   Knee to Chest: Transverse Plane Stability   Bring one knee up, then return. Be sure pelvis does not roll side to side. Keep pelvis still. Lift knee __10_ times each leg. Restabilize pelvis. Repeat with other leg. Do _1-2__ sets, _1__ times per day.   Hip External Rotation With Pillow: Transverse Plane Stability   One knee bent, one leg straight, on pillow. Slowly roll bent knee out. Be sure pelvis does not rotate. Do _10__ times. Restabilize pelvis. Repeat with other leg. Do _1-2__ sets, _1__ times per day.  Heel Slide: 4-10 Inches - Transverse Plane Stability   Slide heel 4 inches down. Be sure pelvis does not rotate. Do _10__ times. Restabilize pelvis. Repeat with other leg. Do __1_ sets, _1__ times per day.   [facility] [address] [place],  [address]  [phone] (office) [phone] (fax)   

## 2015-09-09 ENCOUNTER — Encounter: Payer: Self-pay | Admitting: Endocrinology

## 2015-09-09 ENCOUNTER — Ambulatory Visit: Payer: Self-pay | Admitting: Sports Medicine

## 2015-09-09 ENCOUNTER — Ambulatory Visit (INDEPENDENT_AMBULATORY_CARE_PROVIDER_SITE_OTHER): Payer: Managed Care, Other (non HMO) | Admitting: Endocrinology

## 2015-09-09 VITALS — BP 116/70 | HR 77 | Temp 98.4°F | Resp 16 | Ht 72.0 in | Wt 251.6 lb

## 2015-09-09 DIAGNOSIS — D352 Benign neoplasm of pituitary gland: Secondary | ICD-10-CM

## 2015-09-09 DIAGNOSIS — E1165 Type 2 diabetes mellitus with hyperglycemia: Secondary | ICD-10-CM | POA: Diagnosis not present

## 2015-09-09 DIAGNOSIS — IMO0002 Reserved for concepts with insufficient information to code with codable children: Secondary | ICD-10-CM

## 2015-09-09 NOTE — Progress Notes (Signed)
Patient ID: William Webster, male   DOB: 1951/10/16, 64 y.o.   MRN: [id number]          Chief complaint:  History of Present Illness:   He apparently had a blunt head injury in the year 1997 and a brain scan showed a pituitary  tumor.  He also apparently was having headaches prior to this.  Did not have any visual field problems. He says that his prolactin level was very high and his endocrinologist and neurosurgeon elected to treat him with Parlodel. Also for about 4 months he was treated with testosterone patch even though he does not think he was having any sexual dysfunction No records are available from initial evaluation or follow-up  About a year or so ago because of insurance preference he was changed from Parlodel to Dostinex 0.25 mg twice a week which she takes on Mondays and Fridays He has now been referred here for further follow-up and management  He does not complain of any headaches or blurred vision now. He has had a recent nonfasting prolactin of 18.3 Has been quite regular with taking his Dostinex twice a week He thinks he had a normal eye exam in 2015 but records are not available  He has had no symptoms of heat or cold intolerance, decreased libido, erectile dysfunction, lightheadedness, nausea, significant weight change or fatigue He has not had any evaluation of his pituitary hormones recently   Lab Results  Component Value Date   PROLACTIN 18.3* 08/16/2015     Past Medical History  Diagnosis Date  . Hyperlipidemia   . Post concussive syndrome   . Diabetes mellitus without complication   . Gilbert disease   . Spondylosis of lumbar joint   . Prolactinoma   . Anterior pituitary adenoma syndrome   . Colon polyp   . Family history of cancer of GI tract   . Lumbar scoliosis   . Low back pain   . Family history of colon cancer   . Diabetic neuropathy   . Sciatica of right side     Past Surgical History  Procedure Laterality Date  . Hemorrhoid  surgery    . Tonsillectomy      Family History  Problem Relation Age of Onset  . Cancer Mother     colon  . Heart failure Mother   . Diabetes Mother   . Hyperlipidemia Mother   . Stroke Mother   . Stroke Father   . Hyperlipidemia Father   . Diabetes Maternal Grandfather   . Heart disease Maternal Grandfather   . Stroke Maternal Grandfather   . Diabetes Paternal Grandfather   . Heart disease Paternal Grandfather   . Stroke Paternal Grandfather     Social History:  reports that he has quit smoking. His smoking use included Cigars. He has never used smokeless tobacco. He reports that he drinks about 6.0 oz of alcohol per week. He reports that he does not use illicit drugs.  Allergies: No Known Allergies    Medication List       This list is accurate as of: 09/09/15  3:15 PM.  Always use your most recent med list.               cabergoline 0.5 MG tablet  Commonly known as:  DOSTINEX  Take 0.5 tablets (0.25 mg total) by mouth 2 (two) times a week.     meloxicam 15 MG tablet  Commonly known as:  MOBIC  One tab PO qAM with  breakfast for 2 weeks, then daily prn pain.     metFORMIN 1000 MG tablet  Commonly known as:  GLUCOPHAGE  Take 1 tablet (1,000 mg total) by mouth 2 (two) times daily with a meal.     phentermine 37.5 MG tablet  Commonly known as:  ADIPEX-P  0.5 tab by mouth twice a day     pioglitazone 15 MG tablet  Commonly known as:  ACTOS  Take 15 mg by mouth daily.     rosuvastatin 40 MG tablet  Commonly known as:  CRESTOR  Take 40 mg by mouth daily.     sitaGLIPtin 100 MG tablet  Commonly known as:  JANUVIA  Take 1 tablet (100 mg total) by mouth daily.     topiramate 50 MG tablet  Commonly known as:  TOPAMAX  One half tab by mouth daily for a week, then one tab by mouth daily.     Zinc Chelated 50 MG Tabs  Take by mouth.        LABS:  No visits with results within 1 Week(s) from this visit. Latest known visit with results is:  Office Visit  on 08/23/2015  Component Date Value Ref Range Status  . Sodium 08/23/2015 139  135 - 146 mmol/L Final  . Potassium 08/23/2015 5.3  3.5 - 5.3 mmol/L Final  . Chloride 08/23/2015 104  98 - 110 mmol/L Final  . CO2 08/23/2015 25  20 - 31 mmol/L Final  . Glucose, Bld 08/23/2015 147* 65 - 99 mg/dL Final  . BUN 08/23/2015 23  7 - 25 mg/dL Final  . Creat 08/23/2015 1.08  0.70 - 1.25 mg/dL Final  . Calcium 08/23/2015 9.7  8.6 - 10.3 mg/dL Final  . GFR, Est African American 08/23/2015 85  >=60 mL/min Final  . GFR, Est Non African American 08/23/2015 73  >=60 mL/min Final   Comment:   The estimated GFR is a calculation valid for adults (>=79 years old) that uses the CKD-EPI algorithm to adjust for age and sex. It is   not to be used for children, pregnant women, hospitalized patients,    patients on dialysis, or with rapidly changing kidney function. According to the NKDEP, eGFR >89 is normal, 60-89 shows mild impairment, 30-59 shows moderate impairment, 15-29 shows severe impairment and <15 is ESRD.     Footnotes:  (1) ** Please note change in unit of measure and reference range(s). 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 He thinks his fasting readings are about 150-160 and does not monitor after meals He does not do any exercise Has not seen  a dietitian in quite some time   Lab Results  Component Value Date   HGBA1C 10.1* 08/16/2015  HGBA1C 9.7 08/16/2015   Lab Results  Component Value Date   MICROALBUR 2.0 08/16/2015   LDLCALC 62 08/16/2015   CREATININE 1.08 08/23/2015   OBESITY: He has had phentermine and topiramate prescribed by another physician about 2 months ago and this is continued  Wt Readings from Last 3 Encounters:  09/09/15 251 lb 9.6 oz (114.125 kg)  08/23/15 253 lb (114.76 kg)  08/16/15 257 lb (116.574 kg)    Constitutional:  no complaints of unusual fatigue   Eyes: no history of blurred vision or change in visual fields  ENT: difficulty swallowing not present   Cardiovascular: no chest pain  No leg swelling.  No history of high blood pressure  Respiratory: no cough/shortness of breath  Gastrointestinal: no abdominal pain  Musculoskeletal: no muscle/joint aches  Skin: no rash  Neurological: no headaches, numbness or tingling in feet now, was having some symptoms last year but had improved after his glucose improved Endocrine: No heat or cold intolerance     Urological:  He has mild nocturia but no excessive frequency of urination    PHYSICAL EXAM:  BP 116/70 mmHg  Pulse 77  Temp(Src) 98.4 F (36.9 C)  Resp 16  Ht 6' (1.829 m)  Wt 251 lb 9.6 oz (114.125 kg)  BMI 34.12 kg/m2  SpO2 97%  GENERAL: Generalized obesity present  No pallor, clubbing, lymphadenopathy or edema.  Skin:  no rash or pigmentation.  EYES:  Externally normal.  Visual fields normal by confrontation.  Fundii:  normal discs and vessels.  ENT: Oral mucosa and tongue normal.  THYROID:  Not palpable.  HEART:  Normal  S1 and S2; no murmur or click.  CHEST:  Normal shape.  Lungs: Vescicular breath sounds heard equally.  No crepitations/ wheeze.  ABDOMEN:  No distention.  Liver and spleen not palpable.  No other mass or tenderness.  NEUROLOGICAL: .Reflexes are bilaterally normal at biceps but absent at  ankles.  Monofilament sensation appears normal in toes  JOINTS:  Normal.  ASSESSMENT:   PROLACTINOMA by history. Appears that he has had a macroprolactinoma treated with either bromocriptine or cabergoline since 1997 with good results Although his  prolactin is upper normal not clear if his pituitary tumor size had improved with treatment, no previous records available  He symptomatically is doing fairly well and does not complain of any symptoms suggestive of testosterone, thyroid or adrenal insufficiency.  No fatigue suggestive of growth hormone deficiency  DIABETES: This has been inconsistently controlled and recently A1c over 10% Patient insists that he can control this with improving his diet and eliminating excessive alcohol intake He plans to start using a bicycle for exercise Has not tried medication like Invokana or Trulicity   PLAN:    Continue 0.5 mg cabergoline, half tablet twice a week as before and follow-up every 6 months  Assess pituitary gland function with fasting labs  Will get reports of previous MRI scans done at [facility], if he still has had significant size residual tumor may consider follow-up MRI, meanwhile he will continue to get annual eye exams with visual fields  Continue follow-up with PCP for diabetes for now  [facility] 09/09/2015, 3:15 PM

## 2015-09-10 ENCOUNTER — Ambulatory Visit (INDEPENDENT_AMBULATORY_CARE_PROVIDER_SITE_OTHER): Payer: Managed Care, Other (non HMO) | Admitting: Sports Medicine

## 2015-09-10 ENCOUNTER — Ambulatory Visit (INDEPENDENT_AMBULATORY_CARE_PROVIDER_SITE_OTHER): Payer: Managed Care, Other (non HMO) | Admitting: Osteopathic Medicine

## 2015-09-10 ENCOUNTER — Encounter: Payer: Self-pay | Admitting: Osteopathic Medicine

## 2015-09-10 ENCOUNTER — Encounter: Payer: Self-pay | Admitting: Sports Medicine

## 2015-09-10 VITALS — BP 135/70 | HR 69 | Ht 70.0 in | Wt 252.0 lb

## 2015-09-10 DIAGNOSIS — E669 Obesity, unspecified: Secondary | ICD-10-CM | POA: Diagnosis not present

## 2015-09-10 DIAGNOSIS — M47816 Spondylosis without myelopathy or radiculopathy, lumbar region: Secondary | ICD-10-CM | POA: Diagnosis not present

## 2015-09-10 DIAGNOSIS — E119 Type 2 diabetes mellitus without complications: Secondary | ICD-10-CM

## 2015-09-10 MED ORDER — TOPIRAMATE 100 MG PO TABS
100.0000 mg | ORAL_TABLET | Freq: Every day | ORAL | Status: DC
Start: 2015-09-10 — End: 2015-10-08

## 2015-09-10 MED ORDER — PHENTERMINE HCL 37.5 MG PO TABS: ORAL_TABLET | ORAL | Status: DC

## 2015-09-10 NOTE — Progress Notes (Signed)
HPI: Bennett Ram is a 64 y.o. male who presents to [facility]  today for chief complaint of:  Chief Complaint  Patient presents with  . Advice Only    DISCUSS DOT LETTER    Patient is doing well. He was here to see Dr. Darene Lamer about another issue and wanted to discuss DOT letter with me. 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 Concern is that he is a diabetic, his A1c has been getting progressively worse, he wants to avoid being on insulin. 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 He just wanted to see if I have anything to add to this plan.  ROS: Endocrine: No polyuria/polydipsia  Exam:  BP 135/70 mmHg  Pulse 69  Ht 5\' 10"  (1.778 m)  Wt 252 lb (114.306 kg)  BMI 36.16 kg/m2 Constitutional: VSS, see above. General Appearance: alert, well-developed, well-nourished, NAD   No results found for this or any previous visit (from the past 3 hour(s)).    ASSESSMENT/PLAN:  Discussed oral medications for diabetes, as well as weight reduction, diet and exercise modifications to avoid ending up on insulin. Advised patient to maintain his appointment with occupational health at [employer] paperwork filled out, if I can assist in any way please let me know.

## 2015-09-10 NOTE — Progress Notes (Signed)
  Subjective:    CC:  Follow-up  HPI: Obesity: We are we're in the second month of phentermine, we added Topamax at the last visit, and he is lost over 10 pounds. He does also endorse decreased desire for carbonated beverages including beer which she occasionally binges on.  Diabetes mellitus type 2: Needs to follow-up with his PCP for this.  Lumbar spondylosis: Continues to improve with physical therapy, has graduated to home exercise program.  Past medical history, Surgical history, Family history not pertinant except as noted below, Social history, Allergies, and medications have been entered into the medical record, reviewed, and no changes needed.   Review of Systems: No fevers, chills, night sweats, weight loss, chest pain, or shortness of breath.   Objective:    General: Well Developed, well nourished, and in no acute distress.  Neuro: Alert and oriented x3, extra-ocular muscles intact, sensation grossly intact.  HEENT: Normocephalic, atraumatic, pupils equal round reactive to light, neck supple, no masses, no lymphadenopathy, thyroid nonpalpable.  Skin: Warm and dry, no rashes. Cardiac: Regular rate and rhythm, no murmurs rubs or gallops, no lower extremity edema.  Respiratory: Clear to auscultation bilaterally. Not using accessory muscles, speaking in full sentences.  Impression and Recommendations:    I spent 25 minutes with this patient, greater than 50% was face-to-face time counseling regarding the above diagnoses

## 2015-09-10 NOTE — Assessment & Plan Note (Signed)
10 pound weight loss after the second month of phentermine. We did add Topamax at the last visit which has greatly decreased his intake of sodas. Refilling phentermine and Topamax, return in one month for a weight check.

## 2015-09-10 NOTE — Assessment & Plan Note (Signed)
Continues to do extremity well with weight loss and physical therapy, he has been graduated to a home exercise regimen.

## 2015-10-01 ENCOUNTER — Ambulatory Visit (INDEPENDENT_AMBULATORY_CARE_PROVIDER_SITE_OTHER): Payer: Managed Care, Other (non HMO) | Admitting: Osteopathic Medicine

## 2015-10-01 ENCOUNTER — Encounter: Payer: Self-pay | Admitting: Osteopathic Medicine

## 2015-10-01 VITALS — BP 119/81 | HR 92 | Wt 247.0 lb

## 2015-10-01 DIAGNOSIS — K5909 Other constipation: Secondary | ICD-10-CM

## 2015-10-01 DIAGNOSIS — E119 Type 2 diabetes mellitus without complications: Secondary | ICD-10-CM | POA: Diagnosis not present

## 2015-10-01 LAB — POCT GLYCOSYLATED HEMOGLOBIN (HGB A1C): HEMOGLOBIN A1C: 8

## 2015-10-01 MED ORDER — LACTULOSE 10 G PO PACK: 10.0000 g | PACK | Freq: Two times a day (BID) | ORAL | Status: DC | PRN

## 2015-10-01 NOTE — Progress Notes (Signed)
HPI: William Webster is a 64 y.o. male who presents to [facility]  today for chief complaint of:  Chief Complaint  Patient presents with  . 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 He is here to get A1C checked today, needs A1C to be 7 but it's not a goal yet (aware it's early to be checing A1C)  Constipation - taking fiber mixed in drink every morning with medications. Going every day but occasionally having to strain. No blood in stool. Normal colonoscopy <10 years ago.    Past medical, social and family history reviewed: Past Medical History  Diagnosis Date  . Hyperlipidemia   . Post concussive syndrome   . Diabetes mellitus without complication ([place])   . Gilbert disease   . Spondylosis of lumbar joint   . Prolactinoma ([place])   . Anterior pituitary adenoma syndrome ([place])   . Colon polyp   . Family history of cancer of GI tract   . Lumbar scoliosis   . Low back pain   . Family history of colon cancer   . Diabetic neuropathy ([place])   . Sciatica of right side    Past Surgical History  Procedure Laterality Date  . Hemorrhoid surgery    . Tonsillectomy     Social History  Substance Use Topics  . Smoking status: Former Smoker    Types: Cigars  . Smokeless tobacco: Never Used     Comment: occassional cigar  . Alcohol Use: 6.0 oz/week    10 Standard drinks or equivalent per week     Comment: social 12-20 drinks a week   Family History  Problem Relation Age of Onset  . Cancer Mother     colon  . Heart failure Mother   . Diabetes Mother   . Hyperlipidemia Mother   . Stroke Mother   . Stroke Father   . Hyperlipidemia Father   . Diabetes Maternal Grandfather   . Heart disease Maternal Grandfather   . Stroke Maternal Grandfather   . Diabetes Paternal Grandfather   . Heart disease Paternal Grandfather   . Stroke Paternal Grandfather     Current Outpatient Prescriptions  Medication Sig Dispense Refill  . cabergoline  (DOSTINEX) 0.5 MG tablet Take 0.5 tablets (0.25 mg total) by mouth 2 (two) times a week. 10 tablet 1  . meloxicam (MOBIC) 15 MG tablet One tab PO qAM with breakfast for 2 weeks, then daily prn pain. 30 tablet 3  . metFORMIN (GLUCOPHAGE) 1000 MG tablet Take 1 tablet (1,000 mg total) by mouth 2 (two) times daily with a meal. 180 tablet 3  . phentermine (ADIPEX-P) 37.5 MG tablet 0.5 tab by mouth twice a day 30 tablet 0  . pioglitazone (ACTOS) 15 MG tablet Take 15 mg by mouth daily.    . rosuvastatin (CRESTOR) 40 MG tablet Take 40 mg by mouth daily.    . sitaGLIPtin (JANUVIA) 100 MG tablet Take 1 tablet (100 mg total) by mouth daily. 30 tablet 3  . topiramate (TOPAMAX) 100 MG tablet Take 1 tablet (100 mg total) by mouth at bedtime. One half tab by mouth daily for a week, then one tab by mouth daily. 30 tablet 0  . Zinc Chelated 50 MG TABS Take by mouth.     No current facility-administered medications for this visit.   No Known Allergies    Review of Systems: CONSTITUTIONAL: Neg fever/chills, no unintentional weight changes HEAD/EYES/EARS/NOSE/THROAT: No headache/vision change or hearing  change, no sore throat CARDIAC: No chest pain/pressure/palpitations, no orthopnea RESPIRATORY: No cough/shortness of breath/wheeze GASTROINTESTINAL: No nausea/vomiting/abdominal pain/blood in stool/diarrhea, (+) constipation as per HPI MUSCULOSKELETAL: No myalgia/arthralgia GENITOURINARY: No incontinence SKIN: No rash/wounds/concerning lesions ENDOCRINE: No polyuria/polydipsia/polyphagia, no heat/cold intolerance     Exam:  BP 119/81 mmHg  Pulse 92  Wt 247 lb (112.038 kg) Constitutional: VSS, see above. General Appearance: alert, well-developed, well-nourished, NAD Psychiatric: Normal judgment/insight. Normal mood and affect. Oriented x3.    No results found for this or any previous visit (from the past 72 hour(s)).    ASSESSMENT/PLAN:  Other constipation - no bloody stool, no nausea or  vomiting, patient reassured that clinically he is going every day or every other day and posteriorly concerned, we'll discuss colonoscopy follow-up at his annual physical, lactulose prescription given for as needed. Constipation, recommended continued fiber intake, good hydration, exercise, avoid fatty foods.  Type 2 diabetes mellitus without complication, without long-term current use of insulin ([place]) - Plan: POCT HgB A1C. 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 He states he will cancel his appointment with Dr. pace without health. May consider reapplying for license in the future. 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 No hypoglycemia. Patient to follow-up as scheduled in November for complete physical scheduled A1c rechecked.   Total time spent 15 minutes, greater than 50% of the visit was counseling and coordinating care for diagnosis of diabetes, abn bowel mvmt.

## 2015-10-06 ENCOUNTER — Encounter: Payer: Self-pay | Admitting: Osteopathic Medicine

## 2015-10-08 ENCOUNTER — Ambulatory Visit (INDEPENDENT_AMBULATORY_CARE_PROVIDER_SITE_OTHER): Payer: Managed Care, Other (non HMO) | Admitting: Sports Medicine

## 2015-10-08 VITALS — BP 119/73 | HR 63 | Wt 246.0 lb

## 2015-10-08 DIAGNOSIS — E669 Obesity, unspecified: Secondary | ICD-10-CM | POA: Diagnosis not present

## 2015-10-08 MED ORDER — TOPIRAMATE 200 MG PO TABS: 200.0000 mg | ORAL_TABLET | Freq: Every day | ORAL | Status: DC

## 2015-10-08 MED ORDER — PHENTERMINE HCL 37.5 MG PO TABS: ORAL_TABLET | ORAL | Status: DC

## 2015-10-08 NOTE — Progress Notes (Signed)
  Subjective:    CC: Weight check  HPI: Obesity: Good continued weight loss after 3 months of phentermine and Topamax.  Past medical history, Surgical history, Family history not pertinant except as noted below, Social history, Allergies, and medications have been entered into the medical record, reviewed, and no changes needed.   Review of Systems: No fevers, chills, night sweats, weight loss, chest pain, or shortness of breath.   Objective:    General: Well Developed, well nourished, and in no acute distress.  Neuro: Alert and oriented x3, extra-ocular muscles intact, sensation grossly intact.  HEENT: Normocephalic, atraumatic, pupils equal round reactive to light, neck supple, no masses, no lymphadenopathy, thyroid nonpalpable.  Skin: Warm and dry, no rashes. Cardiac: Regular rate and rhythm, no murmurs rubs or gallops, no lower extremity edema.  Respiratory: Clear to auscultation bilaterally. Not using accessory muscles, speaking in full sentences.  Impression and Recommendations:

## 2015-11-02 ENCOUNTER — Other Ambulatory Visit: Payer: Self-pay

## 2015-11-02 MED ORDER — GLUCOSE BLOOD VI STRP: ORAL_STRIP | Status: DC

## 2015-11-05 ENCOUNTER — Other Ambulatory Visit: Payer: Self-pay | Admitting: Sports Medicine

## 2015-11-12 ENCOUNTER — Ambulatory Visit (INDEPENDENT_AMBULATORY_CARE_PROVIDER_SITE_OTHER): Payer: Managed Care, Other (non HMO) | Admitting: Sports Medicine

## 2015-11-12 ENCOUNTER — Encounter: Payer: Self-pay | Admitting: Sports Medicine

## 2015-11-12 VITALS — BP 116/71 | HR 63 | Wt 242.0 lb

## 2015-11-12 DIAGNOSIS — E119 Type 2 diabetes mellitus without complications: Secondary | ICD-10-CM

## 2015-11-12 DIAGNOSIS — E669 Obesity, unspecified: Secondary | ICD-10-CM

## 2015-11-12 MED ORDER — PHENTERMINE HCL 37.5 MG PO TABS: ORAL_TABLET | ORAL | Status: DC

## 2015-11-12 MED ORDER — EMPAGLIFLOZIN-LINAGLIPTIN 25-5 MG PO TABS: 1.0000 | ORAL_TABLET | Freq: Every day | ORAL | Status: DC

## 2015-11-12 NOTE — Assessment & Plan Note (Signed)
Good weight loss, continue phentermine and Topamax as we enter the fifth month.

## 2015-11-12 NOTE — Assessment & Plan Note (Signed)
Having some difficulty affording Januvia. We will continue Glucophage, Actos, weight loss, I'm going to switch him to [facility] with a discount coupon which should be free.

## 2015-11-12 NOTE — Progress Notes (Signed)
  Subjective:    CC: Recheck  HPI: Obesity: After 4 months of phentermine he has lost a great deal of weight, and is 4 pounds down from the last month, currently doing high-dose Topamax as well.  Diabetes mellitus type 2: Unable to afford Januvia, currently on Glucophage, Actos. Her most recent hemoglobin A1c was 8.0.  Past medical history, Surgical history, Family history not pertinant except as noted below, Social history, Allergies, and medications have been entered into the medical record, reviewed, and no changes needed.   Review of Systems: No fevers, chills, night sweats, weight loss, chest pain, or shortness of breath.   Objective:    General: Well Developed, well nourished, and in no acute distress.  Neuro: Alert and oriented x3, extra-ocular muscles intact, sensation grossly intact.  HEENT: Normocephalic, atraumatic, pupils equal round reactive to light, neck supple, no masses, no lymphadenopathy, thyroid nonpalpable.  Skin: Warm and dry, no rashes. Cardiac: Regular rate and rhythm, no murmurs rubs or gallops, no lower extremity edema.  Respiratory: Clear to auscultation bilaterally. Not using accessory muscles, speaking in full sentences.  Impression and Recommendations:

## 2015-11-17 ENCOUNTER — Other Ambulatory Visit: Payer: Self-pay

## 2015-11-17 DIAGNOSIS — E221 Hyperprolactinemia: Secondary | ICD-10-CM

## 2015-11-17 MED ORDER — CABERGOLINE 0.5 MG PO TABS: 0.2500 mg | ORAL_TABLET | ORAL | Status: DC

## 2015-11-17 NOTE — Telephone Encounter (Signed)
PATIENT REQUEST 90 DAY SUPPLY OF CABERGOLINE 0.5MG . Rhonda Cunningham,CMA

## 2015-11-22 ENCOUNTER — Encounter: Payer: Self-pay | Admitting: Sports Medicine

## 2015-11-22 ENCOUNTER — Ambulatory Visit (INDEPENDENT_AMBULATORY_CARE_PROVIDER_SITE_OTHER): Payer: Managed Care, Other (non HMO) | Admitting: Sports Medicine

## 2015-11-22 VITALS — BP 120/83 | HR 104

## 2015-11-22 DIAGNOSIS — S82841A Displaced bimalleolar fracture of right lower leg, initial encounter for closed fracture: Secondary | ICD-10-CM

## 2015-11-22 NOTE — Progress Notes (Signed)
   Subjective:    I'm seeing this patient as a consultation for:  Dr. Pamala Duffel  CC: ankle fracture  HPI: Yesterday this pleasant 64 year old male took a misstep, and had immediate pain, swelling, bruising around his right ankle, he was seen in the [facility] emergency department and diagnosed with a bimalleolar fracture with disruption of the ankle mortise. Pain is severe, persistent, he was placed in a bulky Jones splint which is uncomfortable and he desires another one.  Past medical history, Surgical history, Family history not pertinant except as noted below, Social history, Allergies, and medications have been entered into the medical record, reviewed, and no changes needed.   Review of Systems: No headache, visual changes, nausea, vomiting, diarrhea, constipation, dizziness, abdominal pain, skin rash, fevers, chills, night sweats, weight loss, swollen lymph nodes, body aches, joint swelling, muscle aches, chest pain, shortness of breath, mood changes, visual or auditory hallucinations.   Objective:   General: Well Developed, well nourished, and in no acute distress.  Neuro/Psych: Alert and oriented x3, extra-ocular muscles intact, able to move all 4 extremities, sensation grossly intact. Skin: Warm and dry, no rashes noted.  Respiratory: Not using accessory muscles, speaking in full sentences, trachea midline.  Cardiovascular: Pulses palpable, no extremity edema. Abdomen: Does not appear distended. Right ankle: Swollen, bruised, tender to palpation on the medial and lateral malleoli. Neurovascularly intact distally.  X-rays reviewed and show a bimalleolar fracture with displacement of the medial malleolus fragment, and displacement of the ankle mortise.  New bulky Jones splint placed.  Impression and Recommendations:   This case required medical decision making of moderate complexity.

## 2015-11-22 NOTE — Assessment & Plan Note (Signed)
With a displaced mortise, this will need open reduction and internal fixation. Bulky Jones splint placed today, he will see Dr. Berenice Primas tomorrow.

## 2015-11-23 ENCOUNTER — Encounter: Payer: Self-pay | Admitting: Osteopathic Medicine

## 2015-11-23 ENCOUNTER — Encounter (HOSPITAL_BASED_OUTPATIENT_CLINIC_OR_DEPARTMENT_OTHER): Payer: Self-pay | Admitting: *Deleted

## 2015-11-24 ENCOUNTER — Ambulatory Visit (HOSPITAL_BASED_OUTPATIENT_CLINIC_OR_DEPARTMENT_OTHER): Payer: Managed Care, Other (non HMO) | Admitting: Certified Registered"

## 2015-11-24 ENCOUNTER — Encounter (HOSPITAL_BASED_OUTPATIENT_CLINIC_OR_DEPARTMENT_OTHER)
Admission: RE | Disposition: A | Discharge status: Home / Self Care | Payer: Self-pay | Source: Ambulatory Visit | Attending: Orthopedic Surgery

## 2015-11-24 ENCOUNTER — Ambulatory Visit (HOSPITAL_BASED_OUTPATIENT_CLINIC_OR_DEPARTMENT_OTHER)
Admission: RE | Admit: 2015-11-24 | Discharge: 2015-11-24 | Disposition: A | Discharge status: Home / Self Care | Payer: Managed Care, Other (non HMO) | Source: Ambulatory Visit | Attending: Orthopedic Surgery | Admitting: Orthopedic Surgery

## 2015-11-24 ENCOUNTER — Encounter (HOSPITAL_BASED_OUTPATIENT_CLINIC_OR_DEPARTMENT_OTHER): Payer: Self-pay | Admitting: Certified Registered"

## 2015-11-24 DIAGNOSIS — Y9289 Other specified places as the place of occurrence of the external cause: Secondary | ICD-10-CM | POA: Diagnosis not present

## 2015-11-24 DIAGNOSIS — F419 Anxiety disorder, unspecified: Secondary | ICD-10-CM | POA: Diagnosis not present

## 2015-11-24 DIAGNOSIS — S82841A Displaced bimalleolar fracture of right lower leg, initial encounter for closed fracture: Secondary | ICD-10-CM | POA: Insufficient documentation

## 2015-11-24 DIAGNOSIS — E1140 Type 2 diabetes mellitus with diabetic neuropathy, unspecified: Secondary | ICD-10-CM | POA: Diagnosis not present

## 2015-11-24 DIAGNOSIS — M47816 Spondylosis without myelopathy or radiculopathy, lumbar region: Secondary | ICD-10-CM | POA: Diagnosis not present

## 2015-11-24 DIAGNOSIS — Y9389 Activity, other specified: Secondary | ICD-10-CM | POA: Diagnosis not present

## 2015-11-24 DIAGNOSIS — E785 Hyperlipidemia, unspecified: Secondary | ICD-10-CM | POA: Insufficient documentation

## 2015-11-24 DIAGNOSIS — Y998 Other external cause status: Secondary | ICD-10-CM | POA: Diagnosis not present

## 2015-11-24 DIAGNOSIS — F17290 Nicotine dependence, other tobacco product, uncomplicated: Secondary | ICD-10-CM | POA: Diagnosis not present

## 2015-11-24 DIAGNOSIS — Z86010 Personal history of colonic polyps: Secondary | ICD-10-CM | POA: Diagnosis not present

## 2015-11-24 DIAGNOSIS — Z6833 Body mass index (BMI) 33.0-33.9, adult: Secondary | ICD-10-CM | POA: Diagnosis not present

## 2015-11-24 DIAGNOSIS — Z800 Family history of malignant neoplasm of digestive organs: Secondary | ICD-10-CM | POA: Insufficient documentation

## 2015-11-24 DIAGNOSIS — Z8719 Personal history of other diseases of the digestive system: Secondary | ICD-10-CM | POA: Diagnosis not present

## 2015-11-24 DIAGNOSIS — M5431 Sciatica, right side: Secondary | ICD-10-CM | POA: Insufficient documentation

## 2015-11-24 DIAGNOSIS — W19XXXA Unspecified fall, initial encounter: Secondary | ICD-10-CM | POA: Diagnosis not present

## 2015-11-24 HISTORY — PX: ORIF ANKLE FRACTURE: SHX5408

## 2015-11-24 HISTORY — DX: Displaced bimalleolar fracture of right lower leg, initial encounter for closed fracture: S82.841A

## 2015-11-24 HISTORY — DX: Anxiety disorder, unspecified: F41.9

## 2015-11-24 LAB — POCT I-STAT, CHEM 8
BUN: 23 mg/dL — ABNORMAL HIGH (ref 6–20)
Calcium, Ion: 1.22 mmol/L (ref 1.13–1.30)
Chloride: 108 mmol/L (ref 101–111)
Creatinine, Ser: 1.1 mg/dL (ref 0.61–1.24)
GLUCOSE: 120 mg/dL — AB (ref 65–99)
HCT: 39 % (ref 39.0–52.0)
HEMOGLOBIN: 13.3 g/dL (ref 13.0–17.0)
POTASSIUM: 3.8 mmol/L (ref 3.5–5.1)
Sodium: 140 mmol/L (ref 135–145)
TCO2: 20 mmol/L (ref 0–100)

## 2015-11-24 LAB — GLUCOSE, CAPILLARY: GLUCOSE-CAPILLARY: 109 mg/dL — AB (ref 65–99)

## 2015-11-24 SURGERY — OPEN REDUCTION INTERNAL FIXATION (ORIF) ANKLE FRACTURE
Anesthesia: Regional | Site: Ankle | Laterality: Right

## 2015-11-24 MED ORDER — CHLORHEXIDINE GLUCONATE 4 % EX LIQD: 60.0000 mL | Freq: Once | CUTANEOUS | Status: DC

## 2015-11-24 MED ORDER — PROMETHAZINE HCL 25 MG/ML IJ SOLN: 6.2500 mg | INTRAMUSCULAR | Status: DC | PRN

## 2015-11-24 MED ORDER — CEFAZOLIN SODIUM-DEXTROSE 2-3 GM-% IV SOLR
2.0000 g | INTRAVENOUS | Status: AC
  Administered 2015-11-24: 2 g via INTRAVENOUS

## 2015-11-24 MED ORDER — ATROPINE SULFATE 0.4 MG/ML IJ SOLN
INTRAMUSCULAR | Status: AC
  Filled 2015-11-24: qty 1

## 2015-11-24 MED ORDER — BUPIVACAINE HCL (PF) 0.5 % IJ SOLN
INTRAMUSCULAR | Status: DC | PRN
  Administered 2015-11-24: 20 mL via PERINEURAL

## 2015-11-24 MED ORDER — MIDAZOLAM HCL 2 MG/2ML IJ SOLN
INTRAMUSCULAR | Status: AC
  Filled 2015-11-24: qty 2

## 2015-11-24 MED ORDER — MIDAZOLAM HCL 2 MG/2ML IJ SOLN
1.0000 mg | INTRAMUSCULAR | Status: DC | PRN
  Administered 2015-11-24: 2 mg via INTRAVENOUS

## 2015-11-24 MED ORDER — DEXAMETHASONE SODIUM PHOSPHATE 10 MG/ML IJ SOLN
INTRAMUSCULAR | Status: AC
  Filled 2015-11-24: qty 1

## 2015-11-24 MED ORDER — PROPOFOL 10 MG/ML IV BOLUS
INTRAVENOUS | Status: DC | PRN
  Administered 2015-11-24: 250 mg via INTRAVENOUS

## 2015-11-24 MED ORDER — ONDANSETRON HCL 4 MG/2ML IJ SOLN
INTRAMUSCULAR | Status: AC
  Filled 2015-11-24: qty 2

## 2015-11-24 MED ORDER — GLYCOPYRROLATE 0.2 MG/ML IJ SOLN: 0.2000 mg | Freq: Once | INTRAMUSCULAR | Status: DC | PRN

## 2015-11-24 MED ORDER — OXYCODONE-ACETAMINOPHEN 5-325 MG PO TABS: 1.0000 | ORAL_TABLET | Freq: Four times a day (QID) | ORAL | Status: DC | PRN

## 2015-11-24 MED ORDER — ONDANSETRON HCL 4 MG/2ML IJ SOLN
INTRAMUSCULAR | Status: DC | PRN
  Administered 2015-11-24: 4 mg via INTRAVENOUS

## 2015-11-24 MED ORDER — CEFAZOLIN SODIUM-DEXTROSE 2-3 GM-% IV SOLR
INTRAVENOUS | Status: AC
  Filled 2015-11-24: qty 50

## 2015-11-24 MED ORDER — KETOROLAC TROMETHAMINE 30 MG/ML IJ SOLN: 30.0000 mg | Freq: Once | INTRAMUSCULAR | Status: DC

## 2015-11-24 MED ORDER — LIDOCAINE-EPINEPHRINE (PF) 1.5 %-1:200000 IJ SOLN
INTRAMUSCULAR | Status: DC | PRN
  Administered 2015-11-24: 15 mL via PERINEURAL

## 2015-11-24 MED ORDER — LIDOCAINE HCL (CARDIAC) 20 MG/ML IV SOLN
INTRAVENOUS | Status: AC
  Filled 2015-11-24: qty 5

## 2015-11-24 MED ORDER — FENTANYL CITRATE (PF) 100 MCG/2ML IJ SOLN
INTRAMUSCULAR | Status: AC
  Filled 2015-11-24: qty 2

## 2015-11-24 MED ORDER — DEXAMETHASONE SODIUM PHOSPHATE 10 MG/ML IJ SOLN
INTRAMUSCULAR | Status: DC | PRN
  Administered 2015-11-24: 4 mg via INTRAVENOUS

## 2015-11-24 MED ORDER — HYDROMORPHONE HCL 1 MG/ML IJ SOLN: 0.2500 mg | INTRAMUSCULAR | Status: DC | PRN

## 2015-11-24 MED ORDER — SCOPOLAMINE 1 MG/3DAYS TD PT72: 1.0000 | MEDICATED_PATCH | Freq: Once | TRANSDERMAL | Status: DC | PRN

## 2015-11-24 MED ORDER — FENTANYL CITRATE (PF) 100 MCG/2ML IJ SOLN
50.0000 ug | INTRAMUSCULAR | Status: AC | PRN
  Administered 2015-11-24: 25 ug via INTRAVENOUS
  Administered 2015-11-24: 100 ug via INTRAVENOUS
  Administered 2015-11-24: 50 ug via INTRAVENOUS
  Administered 2015-11-24: 25 ug via INTRAVENOUS

## 2015-11-24 MED ORDER — LACTATED RINGERS IV SOLN
INTRAVENOUS | Status: DC | PRN
  Administered 2015-11-24 (×2): via INTRAVENOUS

## 2015-11-24 MED ORDER — LIDOCAINE HCL (CARDIAC) 20 MG/ML IV SOLN
INTRAVENOUS | Status: DC | PRN
  Administered 2015-11-24: 60 mg via INTRAVENOUS

## 2015-11-24 MED ORDER — PROPOFOL 500 MG/50ML IV EMUL
INTRAVENOUS | Status: AC
  Filled 2015-11-24: qty 50

## 2015-11-24 MED ORDER — ASPIRIN EC 325 MG PO TBEC: 325.0000 mg | DELAYED_RELEASE_TABLET | Freq: Two times a day (BID) | ORAL | Status: DC

## 2015-11-24 MED ORDER — LACTATED RINGERS IV SOLN: INTRAVENOUS | Status: DC

## 2015-11-24 MED ORDER — CEFAZOLIN SODIUM-DEXTROSE 2-3 GM-% IV SOLR
2.0000 g | Freq: Once | INTRAVENOUS | Status: AC
  Administered 2015-11-24: 2 g via INTRAVENOUS

## 2015-11-24 SURGICAL SUPPLY — 83 items
BANDAGE ELASTIC 4 VELCRO ST LF (GAUZE/BANDAGES/DRESSINGS) ×2 IMPLANT
BANDAGE ELASTIC 6 VELCRO ST LF (GAUZE/BANDAGES/DRESSINGS) ×2 IMPLANT
BANDAGE ESMARK 6X9 LF (GAUZE/BANDAGES/DRESSINGS) ×1 IMPLANT
BENZOIN TINCTURE PRP APPL 2/3 (GAUZE/BANDAGES/DRESSINGS) IMPLANT
BIT DRILL 2.5X2.75 QC CALB (BIT) ×2 IMPLANT
BIT DRILL 3.5X5.5 QC CALB (BIT) ×2 IMPLANT
BLADE SURG 15 STRL LF DISP TIS (BLADE) ×1 IMPLANT
BLADE SURG 15 STRL SS (BLADE) ×1
BNDG ESMARK 4X9 LF (GAUZE/BANDAGES/DRESSINGS) IMPLANT
BNDG ESMARK 6X9 LF (GAUZE/BANDAGES/DRESSINGS) ×2
BRUSH SCRUB DISP (MISCELLANEOUS) ×2 IMPLANT
CANISTER SUCT 1200ML W/VALVE (MISCELLANEOUS) IMPLANT
COVER BACK TABLE 60X90IN (DRAPES) ×2 IMPLANT
COVER MAYO STAND STRL (DRAPES) ×2 IMPLANT
CUFF TOURNIQUET SINGLE 18IN (TOURNIQUET CUFF) IMPLANT
CUFF TOURNIQUET SINGLE 24IN (TOURNIQUET CUFF) ×2 IMPLANT
DECANTER SPIKE VIAL GLASS SM (MISCELLANEOUS) IMPLANT
DRAPE EXTREMITY T 121X128X90 (DRAPE) ×2 IMPLANT
DRAPE OEC MINIVIEW 54X84 (DRAPES) ×2 IMPLANT
DRAPE U 20/CS (DRAPES) ×2 IMPLANT
DRAPE U-SHAPE 47X51 STRL (DRAPES) ×2 IMPLANT
DRSG EMULSION OIL 3X3 NADH (GAUZE/BANDAGES/DRESSINGS) ×4 IMPLANT
DURAPREP 26ML APPLICATOR (WOUND CARE) ×2 IMPLANT
ELECT REM PT RETURN 9FT ADLT (ELECTROSURGICAL) ×2
ELECTRODE REM PT RTRN 9FT ADLT (ELECTROSURGICAL) ×1 IMPLANT
GAUZE SPONGE 4X4 12PLY STRL (GAUZE/BANDAGES/DRESSINGS) ×2 IMPLANT
GAUZE SPONGE 4X4 16PLY XRAY LF (GAUZE/BANDAGES/DRESSINGS) IMPLANT
GLOVE BIOGEL PI IND STRL 7.0 (GLOVE) ×2 IMPLANT
GLOVE BIOGEL PI IND STRL 8 (GLOVE) ×3 IMPLANT
GLOVE BIOGEL PI INDICATOR 7.0 (GLOVE) ×2
GLOVE BIOGEL PI INDICATOR 8 (GLOVE) ×3
GLOVE ECLIPSE 6.5 STRL STRAW (GLOVE) ×2 IMPLANT
GLOVE ECLIPSE 7.5 STRL STRAW (GLOVE) ×4 IMPLANT
GOWN STRL REUS W/ TWL LRG LVL3 (GOWN DISPOSABLE) ×2 IMPLANT
GOWN STRL REUS W/ TWL XL LVL3 (GOWN DISPOSABLE) ×1 IMPLANT
GOWN STRL REUS W/TWL LRG LVL3 (GOWN DISPOSABLE) ×2
GOWN STRL REUS W/TWL XL LVL3 (GOWN DISPOSABLE) ×1 IMPLANT
K-WIRE ACE 1.6X6 (WIRE) ×4
KWIRE ACE 1.6X6 (WIRE) ×2 IMPLANT
MANIFOLD NEPTUNE II (INSTRUMENTS) ×2 IMPLANT
NEEDLE HYPO 22GX1.5 SAFETY (NEEDLE) IMPLANT
NS IRRIG 1000ML POUR BTL (IV SOLUTION) ×2 IMPLANT
PACK BASIN DAY SURGERY FS (CUSTOM PROCEDURE TRAY) ×2 IMPLANT
PAD CAST 4YDX4 CTTN HI CHSV (CAST SUPPLIES) ×1 IMPLANT
PADDING CAST ABS 4INX4YD NS (CAST SUPPLIES) ×2
PADDING CAST ABS COTTON 4X4 ST (CAST SUPPLIES) ×2 IMPLANT
PADDING CAST COTTON 4X4 STRL (CAST SUPPLIES) ×1
PADDING CAST COTTON 6X4 STRL (CAST SUPPLIES) IMPLANT
PENCIL BUTTON HOLSTER BLD 10FT (ELECTRODE) ×2 IMPLANT
PLATE ACE 100DEG 6HOLE (Plate) ×2 IMPLANT
SCREW CORTICAL 3.5MM  12MM (Screw) ×1 IMPLANT
SCREW CORTICAL 3.5MM  28MM (Screw) ×1 IMPLANT
SCREW CORTICAL 3.5MM 12MM (Screw) ×1 IMPLANT
SCREW CORTICAL 3.5MM 14MM (Screw) ×4 IMPLANT
SCREW CORTICAL 3.5MM 28MM (Screw) ×1 IMPLANT
SCREW NLOCK CANC HEX 4X16 (Screw) ×2 IMPLANT
SCREW NLOCK CANC HEX 4X18 (Screw) ×2 IMPLANT
SCREW NLOCK CANC HEX 4X18 FIB (Screw) ×2 IMPLANT
SPLINT FAST PLASTER 5X30 (CAST SUPPLIES) ×20
SPLINT FIBERGLASS 4X30 (CAST SUPPLIES) ×2 IMPLANT
SPLINT PLASTER CAST FAST 5X30 (CAST SUPPLIES) ×20 IMPLANT
SPONGE LAP 4X18 X RAY DECT (DISPOSABLE) ×4 IMPLANT
STAPLER VISISTAT (STAPLE) IMPLANT
STOCKINETTE 6  STRL (DRAPES)
STOCKINETTE 6 STRL (DRAPES) IMPLANT
STRIP CLOSURE SKIN 1/2X4 (GAUZE/BANDAGES/DRESSINGS) IMPLANT
SUCTION FRAZIER TIP 10 FR DISP (SUCTIONS) IMPLANT
SUT ETHILON 3 0 PS 1 (SUTURE) ×4 IMPLANT
SUT ETHILON 4 0 PS 2 18 (SUTURE) IMPLANT
SUT FIBERWIRE #2 38 T-5 BLUE (SUTURE) ×2
SUT MON AB 4-0 PC3 18 (SUTURE) IMPLANT
SUT VIC AB 2-0 SH 27 (SUTURE) ×1
SUT VIC AB 2-0 SH 27XBRD (SUTURE) ×1 IMPLANT
SUT VIC AB 3-0 FS2 27 (SUTURE) ×2 IMPLANT
SUT VICRYL 4-0 PS2 18IN ABS (SUTURE) IMPLANT
SUTURE FIBERWR #2 38 T-5 BLUE (SUTURE) ×1 IMPLANT
SYR 20CC LL (SYRINGE) IMPLANT
SYR BULB 3OZ (MISCELLANEOUS) ×2 IMPLANT
TOWEL OR 17X24 6PK STRL BLUE (TOWEL DISPOSABLE) ×4 IMPLANT
TOWEL OR NON WOVEN STRL DISP B (DISPOSABLE) ×2 IMPLANT
TRAY DSU PREP LF (CUSTOM PROCEDURE TRAY) ×2 IMPLANT
TUBE CONNECTING 20X1/4 (TUBING) ×2 IMPLANT
UNDERPAD 30X30 (UNDERPADS AND DIAPERS) ×2 IMPLANT

## 2015-11-24 NOTE — Transfer of Care (Signed)
Immediate Anesthesia Transfer of Care Note  Patient: William Webster Record  Procedure(s) Performed: Procedure(s) with comments: OPEN REDUCTION INTERNAL FIXATION (ORIF) ANKLE FRACTURE (Right) - Open reduction internal fixation right ankle fracture  Patient Location: PACU  Anesthesia Type:GA combined with regional for post-op pain  Level of Consciousness: awake, alert , oriented and patient cooperative  Airway & Oxygen Therapy: Patient Spontanous Breathing and Patient connected to face mask oxygen  Post-op Assessment: Report given to RN, Post -op Vital signs reviewed and stable and Patient moving all extremities  Post vital signs: Reviewed and stable  Last Vitals:  Filed Vitals:   11/24/15 0830 11/24/15 1025  BP:    Pulse: 71 86  Temp:    Resp: 19 11    Complications: No apparent anesthesia complications

## 2015-11-24 NOTE — Anesthesia Postprocedure Evaluation (Signed)
Anesthesia Post Note  Patient: William Webster  Procedure(s) Performed: Procedure(s) (LRB): OPEN REDUCTION INTERNAL FIXATION (ORIF) ANKLE FRACTURE (Right)  Patient location during evaluation: PACU Anesthesia Type: General Level of consciousness: awake and alert Pain management: pain level controlled Vital Signs Assessment: post-procedure vital signs reviewed and stable Respiratory status: spontaneous breathing, nonlabored ventilation, respiratory function stable and patient connected to nasal cannula oxygen Cardiovascular status: blood pressure returned to baseline and stable Postop Assessment: no signs of nausea or vomiting Anesthetic complications: no    Last Vitals:  Filed Vitals:   11/24/15 1113 11/24/15 1133  BP:  129/65  Pulse: 64 60  Temp:  36.6 C  Resp: 20 18    Last Pain:  Filed Vitals:   11/24/15 1134  PainSc: 0-No pain        RLE Motor Response: No movement due to regional block RLE Sensation: No sensation (absent)      Ram Haugan S

## 2015-11-24 NOTE — Anesthesia Preprocedure Evaluation (Signed)
Anesthesia Evaluation  Patient identified by MRN, date of birth, ID band Patient awake    Reviewed: Allergy & Precautions, NPO status , Patient's Chart, lab work & pertinent test results  Airway Mallampati: II  TM Distance: <3 FB Neck ROM: Full    Dental no notable dental hx.    Pulmonary Current Smoker,    Pulmonary exam normal breath sounds clear to auscultation       Cardiovascular negative cardio ROS Normal cardiovascular exam Rhythm:Regular Rate:Normal     Neuro/Psych negative neurological ROS  negative psych ROS   GI/Hepatic negative GI ROS, Neg liver ROS,   Endo/Other  diabetesMorbid obesity  Renal/GU negative Renal ROS  negative genitourinary   Musculoskeletal negative musculoskeletal ROS (+)   Abdominal   Peds negative pediatric ROS (+)  Hematology negative hematology ROS (+)   Anesthesia Other Findings   Reproductive/Obstetrics negative OB ROS                             Anesthesia Physical Anesthesia Plan  ASA: III  Anesthesia Plan: General   Post-op Pain Management: GA combined w/ Regional for post-op pain   Induction: Intravenous  Airway Management Planned: LMA  Additional Equipment:   Intra-op Plan:   Post-operative Plan: Extubation in OR  Informed Consent: I have reviewed the patients History and Physical, chart, labs and discussed the procedure including the risks, benefits and alternatives for the proposed anesthesia with the patient or authorized representative who has indicated his/her understanding and acceptance.   Dental advisory given  Plan Discussed with: CRNA and Surgeon  Anesthesia Plan Comments:         Anesthesia Quick Evaluation

## 2015-11-24 NOTE — Brief Op Note (Signed)
11/24/2015  10:27 AM  PATIENT:  William Webster  64 y.o. male  PRE-OPERATIVE DIAGNOSIS:  Right Bimalleolar ankle fracture  POST-OPERATIVE DIAGNOSIS:  Right Bimalleolar ankle fracture  PROCEDURE:  Procedure(s) with comments: OPEN REDUCTION INTERNAL FIXATION (ORIF) ANKLE FRACTURE (Right) - Open reduction internal fixation right ankle fracture  SURGEON:  Surgeon(s) and Role:    * Dorna Leitz, MD - Primary  PHYSICIAN ASSISTANT:   ASSISTANTS: bethune   ANESTHESIA:   general  EBL:  Total I/O In: 1500 [I.V.:1500] Out: -   BLOOD ADMINISTERED:none  DRAINS: none   LOCAL MEDICATIONS USED:  NONE  SPECIMEN:  No Specimen  DISPOSITION OF SPECIMEN:  N/A  COUNTS:  YES  TOURNIQUET:   Total Tourniquet Time Documented: Calf (Right) - 72 minutes Total: Calf (Right) - 72 minutes   DICTATION: .Other Dictation: Dictation Number [id number]  PLAN OF CARE: Admit to inpatient   PATIENT DISPOSITION:  PACU - hemodynamically stable.   Delay start of Pharmacological VTE agent (>24hrs) due to surgical blood loss or risk of bleeding: no

## 2015-11-24 NOTE — Discharge Instructions (Signed)
Ambulate nonweightbearing on the right with crutches. Elevate your right ankle as much as possible above your heart.    Post Anesthesia Home Care Instructions  Activity: Get plenty of rest for the remainder of the day. A responsible adult should stay with you for 24 hours following the procedure.  For the next 24 hours, DO NOT: -Drive a car -Paediatric nurse -Drink alcoholic beverages -Take any medication unless instructed by your physician -Make any legal decisions or sign important papers.  Meals: Start with liquid foods such as gelatin or soup. Progress to regular foods as tolerated. Avoid greasy, spicy, heavy foods. If nausea and/or vomiting occur, drink only clear liquids until the nausea and/or vomiting subsides. Call your physician if vomiting continues.  Special Instructions/Symptoms: Your throat may feel dry or sore from the anesthesia or the breathing tube placed in your throat during surgery. If this causes discomfort, gargle with warm salt water. The discomfort should disappear within 24 hours.  If you had a scopolamine patch placed behind your ear for the management of post- operative nausea and/or vomiting:  1. The medication in the patch is effective for 72 hours, after which it should be removed.  Wrap patch in a tissue and discard in the trash. Wash hands thoroughly with soap and water. 2. You may remove the patch earlier than 72 hours if you experience unpleasant side effects which may include dry mouth, dizziness or visual disturbances. 3. Avoid touching the patch. Wash your hands with soap and water after contact with the patch.   Regional Anesthesia Blocks  1. Numbness or the inability to move the "blocked" extremity may last from 3-48 hours after placement. The length of time depends on the medication injected and your individual response to the medication. If the numbness is not going away after 48 hours, call your surgeon.  2. The extremity that is blocked  will need to be protected until the numbness is gone and the  Strength has returned. Because you cannot feel it, you will need to take extra care to avoid injury. Because it may be weak, you may have difficulty moving it or using it. You may not know what position it is in without looking at it while the block is in effect.  3. For blocks in the legs and feet, returning to weight bearing and walking needs to be done carefully. You will need to wait until the numbness is entirely gone and the strength has returned. You should be able to move your leg and foot normally before you try and bear weight or walk. You will need someone to be with you when you first try to ensure you do not fall and possibly risk injury.  4. Bruising and tenderness at the needle site are common side effects and will resolve in a few days.  5. Persistent numbness or new problems with movement should be communicated to the surgeon or the [place] [phone] [place] [phone]).  Call your surgeon if you experience:   1.  Fever over 101.0. 2.  Inability to urinate. 3.  Nausea and/or vomiting. 4.  Extreme swelling or bruising at the surgical site. 5.  Continued bleeding from the incision. 6.  Increased pain, redness or drainage from the incision. 7.  Problems related to your pain medication. 8. Any change in color, movement and/or sensation 9. Any problems and/or concerns

## 2015-11-24 NOTE — H&P (Signed)
PREOPERATIVE H&P  Chief Complaint: right ankle pain  HPI: William Webster is a 64 y.o. male who presents for evaluation of right ankle pain. It has been present for 5 days and has been worsening.the patient was seen in my office yesterday after a fall and visit to the emergency room over the weekend where he was diagnosed with a bimalleolar ankle fracture.  He denies previous history of injury to the ankle. He has failed conservative measures. Pain is rated as moderate.  Past Medical History  Diagnosis Date  . Hyperlipidemia   . Diabetes mellitus without complication ([place])   . Gilbert disease   . Spondylosis of lumbar joint   . Prolactinoma ([place])   . Anterior pituitary adenoma syndrome ([place])   . Colon polyp   . Family history of cancer of GI tract   . Lumbar scoliosis   . Low back pain   . Family history of colon cancer   . Diabetic neuropathy ([place])   . Sciatica of right side   . Anxiety   . Bimalleolar fracture of right ankle    Past Surgical History  Procedure Laterality Date  . Hemorrhoid surgery    . Tonsillectomy     Social History   Social History  . Marital Status: Married    Spouse Name: N/A  . Number of Children: N/A  . Years of Education: N/A   Social History Main Topics  . Smoking status: Light Tobacco Smoker    Types: Cigars  . Smokeless tobacco: Never Used     Comment: occassional cigar  . Alcohol Use: 6.0 oz/week    10 Standard drinks or equivalent per week     Comment: social 12-20 drinks a week  . Drug Use: No  . Sexual Activity: Yes    Birth Control/ Protection: None   Other Topics Concern  . None   Social History Narrative   Family History  Problem Relation Age of Onset  . Cancer Mother     colon  . Heart failure Mother   . Diabetes Mother   . Hyperlipidemia Mother   . Stroke Mother   . Stroke Father   . Hyperlipidemia Father   . Diabetes Maternal Grandfather   . Heart disease Maternal Grandfather   . Stroke Maternal  Grandfather   . Diabetes Paternal Grandfather   . Heart disease Paternal Grandfather   . Stroke Paternal Grandfather    No Known Allergies Prior to Admission medications   Medication Sig Start Date End Date Taking? Authorizing Provider  cabergoline (DOSTINEX) 0.5 MG tablet Take 0.5 tablets (0.25 mg total) by mouth 2 (two) times a week. 11/17/15  Yes Natalie Alexander, DO  Empagliflozin-Linagliptin (GLYXAMBI) 25-5 MG TABS Take 1 tablet by mouth daily. 11/12/15  Yes Silverio Decamp, MD  meloxicam (MOBIC) 15 MG tablet One tab PO qAM with breakfast for 2 weeks, then daily prn pain. 08/12/15  Yes Silverio Decamp, MD  metFORMIN (GLUCOPHAGE) 1000 MG tablet Take 1 tablet (1,000 mg total) by mouth 2 (two) times daily with a meal. Patient taking differently: Take 1,000 mg by mouth daily with breakfast.  08/16/15  Yes Emeterio Reeve, DO  oxyCODONE-acetaminophen (PERCOCET/ROXICET) 5-325 MG tablet Take 1-2 tablets by mouth. 11/21/15 12/01/15 Yes Historical Provider, MD  phentermine (ADIPEX-P) 37.5 MG tablet 0.5 tab by mouth twice a day 11/12/15  Yes Silverio Decamp, MD  pioglitazone (ACTOS) 15 MG tablet Take 15 mg by mouth daily.   Yes Historical Provider, MD  rosuvastatin (  CRESTOR) 40 MG tablet Take 40 mg by mouth daily.   Yes Historical Provider, MD  topiramate (TOPAMAX) 200 MG tablet TAKE 1 TABLET BY MOUTH EVERY DAY 11/05/15  Yes Silverio Decamp, MD  glucose blood test strip Check blood sugar twice daily. Diagnosis DM ICD 10 - E11.9 11/02/15   Emeterio Reeve, DO     Positive ROS: none  All other systems have been reviewed and were otherwise negative with the exception of those mentioned in the HPI and as above.  Physical Exam: There were no vitals filed for this visit.  General: Alert, no acute distress Cardiovascular: No pedal edema Respiratory: No cyanosis, no use of accessory musculature GI: No organomegaly, abdomen is soft and non-tender Skin: No lesions in the  area of chief complaint Neurologic: Sensation intact distally Psychiatric: Patient is competent for consent with normal mood and affect Lymphatic: No axillary or cervical lymphadenopathy  MUSCULOSKELETAL: right ankle: Neurovascularly intact distally.  Moderate soft tissue swelling.  Skin not examined as the patient is in a compressive posterior splint.  X-ray: X-ray shows displaced bimalleolar ankle fracture.  Assessment/Plan: Right Bimalleolar ankle fracture Plan for Procedure(s): OPEN REDUCTION INTERNAL FIXATION (ORIF) ANKLE FRACTURE  The risks benefits and alternatives were discussed with the patient including but not limited to the risks of nonoperative treatment, versus surgical intervention including infection, bleeding, nerve injury, malunion, nonunion, hardware prominence, hardware failure, need for hardware removal, blood clots, cardiopulmonary complications, morbidity, mortality, among others, and they were willing to proceed.  Predicted outcome is good, although there will be at least a six to nine month expected recovery.  Elvia Aydin L, MD 11/24/2015 2:29 AM

## 2015-11-24 NOTE — Anesthesia Procedure Notes (Addendum)
Anesthesia Regional Block:  Popliteal block  Pre-Anesthetic Checklist: ,, timeout performed, Correct Patient, Correct Site, Correct Laterality, Correct Procedure, Correct Position, site marked, Risks and benefits discussed,  Surgical consent,  Pre-op evaluation,  At surgeon's request and post-op pain management  Laterality: Right  Prep: chloraprep       Needles:  Injection technique: Single-shot  Needle Type: Echogenic Needle     Needle Length: 9cm 9 cm Needle Gauge: 22 and 22 G    Additional Needles:  Procedures: ultrasound guided (picture in chart) Popliteal block Narrative:  Injection made incrementally with aspirations every 5 mL.  Performed by: Personally   Additional Notes: Patient tolerated the procedure well without complications   Procedure Name: LMA Insertion Date/Time: 11/24/2015 8:47 AM Performed by: Baxter Flattery Pre-anesthesia Checklist: Patient identified, Emergency Drugs available, Suction available and Patient being monitored Patient Re-evaluated:Patient Re-evaluated prior to inductionOxygen Delivery Method: Circle System Utilized Preoxygenation: Pre-oxygenation with 100% oxygen Intubation Type: IV induction Ventilation: Mask ventilation without difficulty LMA: LMA inserted LMA Size: 5.0 Number of attempts: 1 Airway Equipment and Method: Bite block Placement Confirmation: positive ETCO2 and breath sounds checked- equal and bilateral Tube secured with: Tape Dental Injury: Teeth and Oropharynx as per pre-operative assessment

## 2015-11-24 NOTE — Progress Notes (Signed)
Assisted Dr. Rose with right, ultrasound guided, popliteal/saphenous block. Side rails up, monitors on throughout procedure. See vital signs in flow sheet. Tolerated Procedure well.  

## 2015-11-25 ENCOUNTER — Encounter (HOSPITAL_BASED_OUTPATIENT_CLINIC_OR_DEPARTMENT_OTHER): Payer: Self-pay | Admitting: Orthopedic Surgery

## 2015-11-25 NOTE — Op Note (Signed)
NAME:  William Webster, William Webster NO.:  [id number]  MEDICAL RECORD NO.:  [id number]  LOCATION:                               FACILITY:  [facility]  PHYSICIAN:  Alta Corning, M.D.   DATE OF BIRTH:  [birth year]  DATE OF PROCEDURE:  11/24/2015 DATE OF DISCHARGE:  11/24/2015                              OPERATIVE REPORT   PREOPERATIVE DIAGNOSIS:  Displaced, bimalleolar ankle fracture.  POSTOPERATIVE DIAGNOSIS:  Displaced, bimalleolar ankle fracture.  PROCEDURE:1.  Open reduction and internal fixation of bimalleolar ankle fracture with a one-third tubular plate and interfragmentary screw laterally and two K-wires with a tension band, FiberWire medially. 2.interpretation of multiple intraoperative fluoroscopic images SURGEON:  Alta Corning, M.D.  ASSISTANT:  Gary Fleet, P.A.  ANESTHESIA:  General.  BRIEF HISTORY:  William Webster is a 64 year old male with long history of significant complaints of right ankle pain after a fall.  He was seen in our office and noted to have a displaced, bimalleolar ankle fracture. We talked about treatment options, but felt that given the displaced nature of the fracture that open reduction and internal fixation was only reasonable course of action.  He was brought to the operating room for this procedure.  DESCRIPTION OF PROCEDURE:  The patient was brought to the operating room.  After adequate anesthesia was obtained with general anesthetic, the patient was placed supine on the operating table.  The right leg was then prepped and draped in usual sterile fashion.  Following this, the leg was exsanguinated and blood pressure tourniquet was inflated to 250 mmHg.  Following this, an incision was made over the lateral side, subcutaneous tissue down the level of the fracture.  Healing elements and clot were irrigated out of the fractured element.  A manipulative closed reduction was undertaken with a clamp and an interfragmentary compression  screw was placed holding the fracture anatomically reduced. A 6-hole one-third tubular plate was then bent and contorted and applied in the standard fashion giving a nice neutralization of the fracture and anatomic reduction.  At this time, attention was turned to the medial side where an incision was made subcutaneous tissue down the level of the medial malleolar piece.  Unfortunately, it was severely comminuted with medial fragmentation, multiple.  As best we could, did a careful dissection and were able to identify what we thought was the best location to compress the medial fragment.  We got two K-wires on it and clearly it looked as if we tried to put a screw in for compression that we were going to fragment the piece.  At that point, we felt that we do a tension band technique.  We put a FiberWire through the distal tibia through two drill holes and then took this down around the K-wires, which were backed up, bent over, twisted and then hammered into place giving nice compression and then we tied the FiberWire tension band.  At this point, the wound was irrigated, final images were taken.  Anatomic reduction had been achieved.  The wounds were closed with interrupted Vicryl and nylon sutures.  Sterile compressive dressing was applied as well as the U and posterior and the patient was taken to the recovery room, was  noted to be in satisfactory condition with estimated blood loss for the procedure being minimal.     Alta Corning, M.D.     Corliss Skains  D:  11/24/2015  T:  11/25/2015  Job:  [id number]

## 2015-12-06 ENCOUNTER — Other Ambulatory Visit: Payer: Self-pay | Admitting: Sports Medicine

## 2015-12-10 ENCOUNTER — Encounter: Payer: Self-pay | Admitting: Sports Medicine

## 2015-12-10 ENCOUNTER — Ambulatory Visit (INDEPENDENT_AMBULATORY_CARE_PROVIDER_SITE_OTHER): Payer: Managed Care, Other (non HMO) | Admitting: Sports Medicine

## 2015-12-10 ENCOUNTER — Other Ambulatory Visit: Payer: Self-pay | Admitting: Sports Medicine

## 2015-12-10 VITALS — BP 135/81 | HR 105

## 2015-12-10 DIAGNOSIS — E119 Type 2 diabetes mellitus without complications: Secondary | ICD-10-CM

## 2015-12-10 DIAGNOSIS — Z1211 Encounter for screening for malignant neoplasm of colon: Secondary | ICD-10-CM | POA: Insufficient documentation

## 2015-12-10 DIAGNOSIS — E669 Obesity, unspecified: Secondary | ICD-10-CM | POA: Diagnosis not present

## 2015-12-10 DIAGNOSIS — S82841D Displaced bimalleolar fracture of right lower leg, subsequent encounter for closed fracture with routine healing: Secondary | ICD-10-CM

## 2015-12-10 NOTE — Progress Notes (Signed)
  Subjective:    CC:  Follow-up  HPI:  William Webster is now 2 weeks post ORIF of a bimalleolar right ankle fracture , done by Dr. Berenice Primas and doing well.   Obesity: has been using phentermine, explained that when he to hold off on this while he is healing.   Diabetes mellitus type 2: Doing well on current medications.   Preventive measures: Due for a colonoscopy, but they are wondering what to do considering his current difficulty with ambulation, they're agreeable to do Cologuard  Past medical history, Surgical history, Family history not pertinant except as noted below, Social history, Allergies, and medications have been entered into the medical record, reviewed, and no changes needed.   Review of Systems: No fevers, chills, night sweats, weight loss, chest pain, or shortness of breath.   Objective:    General: Well Developed, well nourished, and in no acute distress.  Neuro: Alert and oriented x3, extra-ocular muscles intact, sensation grossly intact.  HEENT: Normocephalic, atraumatic, pupils equal round reactive to light, neck supple, no masses, no lymphadenopathy, thyroid nonpalpable.  Skin: Warm and dry, no rashes. Cardiac: Regular rate and rhythm, no murmurs rubs or gallops, no lower extremity edema.  Respiratory: Clear to auscultation bilaterally. Not using accessory muscles, speaking in full sentences. Right ankle: incisions are clean, dry, intact, good motion, neurovascularly intact distally. No sign of bacterial superinfection.  Impression and Recommendations:

## 2015-12-10 NOTE — Assessment & Plan Note (Signed)
Cologuard

## 2015-12-10 NOTE — Assessment & Plan Note (Signed)
2 weeks postop, open reduction internal fixation of bimalleolar fracture, further follow-up with Dr. Berenice Primas.

## 2015-12-10 NOTE — Assessment & Plan Note (Signed)
Discontinue phentermine for now. We will keep this off for 6 weeks until he is healed. He can return to see me after that and will restart.

## 2015-12-10 NOTE — Assessment & Plan Note (Signed)
Continue Glyxambi, metformin twice a day, Actos. Aggressive control of blood sugar is important for wound healing. Follow-up with PCP for this.

## 2016-01-07 ENCOUNTER — Other Ambulatory Visit: Payer: Self-pay | Admitting: Sports Medicine

## 2016-01-10 LAB — COLOGUARD: Cologuard: NEGATIVE

## 2016-01-12 ENCOUNTER — Ambulatory Visit (INDEPENDENT_AMBULATORY_CARE_PROVIDER_SITE_OTHER): Payer: Managed Care, Other (non HMO) | Admitting: Osteopathic Medicine

## 2016-01-12 ENCOUNTER — Encounter: Payer: Self-pay | Admitting: Osteopathic Medicine

## 2016-01-12 VITALS — BP 110/59 | HR 61 | Ht 72.0 in | Wt 233.0 lb

## 2016-01-12 DIAGNOSIS — Z23 Encounter for immunization: Secondary | ICD-10-CM | POA: Diagnosis not present

## 2016-01-12 DIAGNOSIS — E119 Type 2 diabetes mellitus without complications: Secondary | ICD-10-CM | POA: Diagnosis not present

## 2016-01-12 DIAGNOSIS — Z0000 Encounter for general adult medical examination without abnormal findings: Secondary | ICD-10-CM

## 2016-01-12 DIAGNOSIS — S82841D Displaced bimalleolar fracture of right lower leg, subsequent encounter for closed fracture with routine healing: Secondary | ICD-10-CM

## 2016-01-12 DIAGNOSIS — W19XXXD Unspecified fall, subsequent encounter: Secondary | ICD-10-CM

## 2016-01-12 LAB — COMPLETE METABOLIC PANEL WITH GFR
ALBUMIN: 4.5 g/dL (ref 3.6–5.1)
ALT: 15 U/L (ref 9–46)
AST: 16 U/L (ref 10–35)
Alkaline Phosphatase: 51 U/L (ref 40–115)
BUN: 30 mg/dL — ABNORMAL HIGH (ref 7–25)
CALCIUM: 9.7 mg/dL (ref 8.6–10.3)
CO2: 18 mmol/L — ABNORMAL LOW (ref 20–31)
Chloride: 110 mmol/L (ref 98–110)
Creat: 0.97 mg/dL (ref 0.70–1.25)
GFR, EST NON AFRICAN AMERICAN: 83 mL/min (ref >=60)
GLUCOSE: 149 mg/dL — AB (ref 65–99)
Potassium: 5.2 mmol/L (ref 3.5–5.3)
Sodium: 141 mmol/L (ref 135–146)
TOTAL PROTEIN: 7 g/dL (ref 6.1–8.1)
Total Bilirubin: 0.8 mg/dL (ref 0.2–1.2)

## 2016-01-12 LAB — CBC WITH DIFFERENTIAL/PLATELET
Basophils Absolute: 0.1 10*3/uL (ref 0.0–0.1)
Basophils Relative: 1 % (ref 0–1)
Eosinophils Absolute: 0.1 10*3/uL (ref 0.0–0.7)
Eosinophils Relative: 2 % (ref 0–5)
HEMATOCRIT: 44.4 % (ref 39.0–52.0)
HEMOGLOBIN: 14.4 g/dL (ref 13.0–17.0)
LYMPHS ABS: 1.3 10*3/uL (ref 0.7–4.0)
Lymphocytes Relative: 23 % (ref 12–46)
MCH: 28.5 pg (ref 26.0–34.0)
MCHC: 32.4 g/dL (ref 30.0–36.0)
MCV: 87.7 fL (ref 78.0–100.0)
MONO ABS: 0.5 10*3/uL (ref 0.1–1.0)
MONOS PCT: 8 % (ref 3–12)
MPV: 9.2 fL (ref 8.6–12.4)
NEUTROS ABS: 3.8 10*3/uL (ref 1.7–7.7)
NEUTROS PCT: 66 % (ref 43–77)
Platelets: 143 10*3/uL — ABNORMAL LOW (ref 150–400)
RBC: 5.06 MIL/uL (ref 4.22–5.81)
RDW: 14.9 % (ref 11.5–15.5)
WBC: 5.7 10*3/uL (ref 4.0–10.5)

## 2016-01-12 LAB — LIPID PANEL
Cholesterol: 169 mg/dL (ref 125–200)
HDL: 60 mg/dL (ref >=40)
LDL CALC: 91 mg/dL (ref <130)
TRIGLYCERIDES: 88 mg/dL (ref <150)
Total CHOL/HDL Ratio: 2.8 Ratio (ref <=5.0)
VLDL: 18 mg/dL (ref <30)

## 2016-01-12 LAB — POCT GLYCOSYLATED HEMOGLOBIN (HGB A1C): Hemoglobin A1C: 6.5

## 2016-01-12 MED ORDER — ROSUVASTATIN CALCIUM 40 MG PO TABS: 40.0000 mg | ORAL_TABLET | Freq: Every day | ORAL | Status: DC

## 2016-01-12 NOTE — Progress Notes (Signed)
HPI: William Webster is a 65 y.o. male who presents to [facility] today for chief complaint of:  Chief Complaint  Patient presents with  . Annual Exam     Annual exam and quick check of chronic medical problems.   DM2: Glc 103 - 150 fasting  Doing well on  DIABETES SCREENING/PREVENTIVE CARE: A1C past 3-6 mos: Yes  controlled? Yes  BP goal <140/90: Yes  LDL goal <70: Yes  Eye exam annually: No , importance discussed with patient Foot exam: Yes  Microalbuminuria:No  Metformin: Yes  ACE/ARB: No  Antiplatelet if ASCVD Risk >10%: No  Statin: Yes   Pituitary Adenoma and Hyperprolactinemia: following with endocrinology  Ankle Fracture: walking ok, Dr. Darene Lamer and Ortho managing the ankle.    MALE PREVENTIVE CARE  ANNUAL SCREENING/COUNSELING Tobacco - occasional cigar use  Alcohol - social drinker has cut back as A1C was higher  Diet/Exercise - HEALTHY HABITS DISCUSSED TO DECREASE CV RISK Sexual Health - Yes with male. STI - The patient denies history of sexually transmitted disease. INTERESTED IN STI TESTING - no Depression - PQH2 Negative Domestic violence concerns - no HTN SCREENING - SEE VITALS Vaccination status - SEE BELOW  INFECTIOUS DISEASE SCREENING HIV - all adults 15-65 - does not need GC/CT - sexually active - does not need HepC - born 50-1963 - does not need TB - if risk/required by employer - does not need  DISEASE SCREENING Lipid - (Low risk screen M35; High risk screen M25 if HTN, Tob, FH CHD M<55/F<65) - needs DM2 (45+ or Risk = FH 1st deg DM, Hx GDM, overweight/sedentary, high-risk ethnicity, HTN) - does not need Osteoporosis - age 37+ or one sooner if risk - does not need AAA - once age 34-75 if smoker - does not need  CANCER SCREENING Lung - annual low dose CT Chest age 35-85 w/ 30+ PY, current/quit past 15 years - CT - does not need Colon - age 23+ or 63 years of age prior to [place] Dx - GI REFERRAL - needs -  Dr.T. Ordered Cologuard, results should be in soon  Prostate - (shared decision making age 66 in average-risk, age 68 to 56 high risk black men, men with a family history of prostate cancer younger than age 57, BRCA1 or BRCA2) - does not want/need  ADULT VACCINATION Influenza - annual - already has Td booster every 10 years - already has HPV - age <64yo - was not indicated Zoster - age 83+ - already has Pneumonia - age 34+ sooner if risk (DM, smoker, other) - was given   Past medical, social and family history reviewed: Past Medical History  Diagnosis Date  . Hyperlipidemia   . Diabetes mellitus without complication ([place])   . Gilbert disease   . Spondylosis of lumbar joint   . Prolactinoma ([place])   . Anterior pituitary adenoma syndrome ([place])   . Colon polyp   . Family history of cancer of GI tract   . Lumbar scoliosis   . Low back pain   . Family history of colon cancer   . Diabetic neuropathy ([place])   . Sciatica of right side   . Anxiety   . Bimalleolar fracture of right ankle    Past Surgical History  Procedure Laterality Date  . Hemorrhoid surgery    . Tonsillectomy    . Orif ankle fracture Right 11/24/2015    Procedure: OPEN REDUCTION INTERNAL FIXATION (ORIF) ANKLE FRACTURE;  Surgeon: Dorna Leitz,  MD;  Location: [facility];  Service: Orthopedics;  Laterality: Right;  Open reduction internal fixation right ankle fracture   Social History  Substance Use Topics  . Smoking status: Light Tobacco Smoker    Types: Cigars  . Smokeless tobacco: Never Used     Comment: occassional cigar  . Alcohol Use: 6.0 oz/week    10 Standard drinks or equivalent per week     Comment: social 12-20 drinks a week   Family History  Problem Relation Age of Onset  . Cancer Mother     colon  . Heart failure Mother   . Diabetes Mother   . Hyperlipidemia Mother   . Stroke Mother   . Stroke Father   . Hyperlipidemia Father   . Diabetes Maternal Grandfather   . Heart disease  Maternal Grandfather   . Stroke Maternal Grandfather   . Diabetes Paternal Grandfather   . Heart disease Paternal Grandfather   . Stroke Paternal Grandfather     Current Outpatient Prescriptions  Medication Sig Dispense Refill  . cabergoline (DOSTINEX) 0.5 MG tablet Take 0.5 tablets (0.25 mg total) by mouth 2 (two) times a week. 90 tablet 3  . Empagliflozin-Linagliptin (GLYXAMBI) 25-5 MG TABS Take 1 tablet by mouth daily. 30 tablet 11  . glucose blood test strip Check blood sugar twice daily. Diagnosis DM ICD 10 - E11.9 100 each 12  . meloxicam (MOBIC) 15 MG tablet TAKE 1 TABLET BY MOUTH EVERY MORINING WITH BREAKFAST FOR 2 WEEKS, THEN DAILY AS NEEDED FOR PAIN 30 tablet 2  . metFORMIN (GLUCOPHAGE) 1000 MG tablet Take 1 tablet (1,000 mg total) by mouth 2 (two) times daily with a meal. (Patient taking differently: Take 1,000 mg by mouth daily with breakfast. ) 180 tablet 3  . pioglitazone (ACTOS) 15 MG tablet Take 15 mg by mouth daily.    . rosuvastatin (CRESTOR) 40 MG tablet Take 40 mg by mouth daily.    Marland Kitchen topiramate (TOPAMAX) 200 MG tablet TAKE 1 TABLET BY MOUTH EVERY DAY 30 tablet 0   No current facility-administered medications for this visit.   No Known Allergies    Review of Systems: CONSTITUTIONAL:  No  fever, no chills, No  unintentional weight changes HEAD/EYES/EARS/NOSE/THROAT: No  headache, no vision change, no hearing change, No  sore throat, No  sinus pressure CARDIAC: No  chest pain, No  pressure, No palpitations, No  orthopnea RESPIRATORY: No  cough, No  shortness of breath/wheeze GASTROINTESTINAL: No  nausea, No  vomiting, No  abdominal pain, No  blood in stool, No  diarrhea, No  constipation  MUSCULOSKELETAL: (+) myalgia/arthralgia R ankledue to fracture GENITOURINARY: No  incontinence, No  abnormal genital bleeding/discharge SKIN: No  rash/wounds/concerning lesions HEM/ONC: No  easy bruising/bleeding, No  abnormal lymph node ENDOCRINE: No polyuria/polydipsia/polyphagia,  No  heat/cold intolerance  NEUROLOGIC: No  weakness, No  dizziness, No  slurred speech PSYCHIATRIC: No  concerns with depression, No  concerns with anxiety, No sleep problems  Exam:  BP 110/59 mmHg  Pulse 61  Ht 6' (1.829 m)  Wt 233 lb (105.688 kg)  BMI 31.59 kg/m2 Constitutional: VS see above. General Appearance: alert, well-developed, well-nourished, NAD Eyes: Normal lids and conjunctive, non-icteric sclera, PERRLA Ears, Nose, Mouth, Throat: MMM, Normal external inspection ears/nares/mouth/lips/gums, TM normal bilaterally. Pharynx no erythema, no exudate.  Neck: No masses, trachea midline. No thyroid enlargement/tenderness/mass appreciated. No lymphadenopathy Respiratory: Normal respiratory effort. no wheeze, no rhonchi, no rales Cardiovascular: S1/S2 normal, no murmur, no rub/gallop auscultated.  RRR. No lower extremity edema. Musculoskeletal: Gait steady but using cane. No clubbing/cyanosis of digits. (+) postsurgical scars R ankle Neurological: No cranial nerve deficit on limited exam. Motor and sensation intact and symmetric Skin: warm, dry, intact. No rash/ulcer. No concerning nevi or subq nodules on limited exam.   Psychiatric: Normal judgment/insight. Normal mood and affect. Oriented x3.  DM Foot Exam: see Quality Metrics: normal  Results for orders placed or performed in visit on 01/12/16 (from the past 72 hour(s))  POCT HgB A1C     Status: None   Collection Time: 01/12/16  8:50 AM  Result Value Ref Range   Hemoglobin A1C 6.5      ASSESSMENT/PLAN:  Annual physical exam - updated vaccines and DM preventive care, DEXA + VitD due to recent fall and fracture, A1C much better, pt wants to stay on all meds as above (4 oral DM meds) I advised this is ok as long as labs are looking good   Type 2 diabetes mellitus without complication, without long-term current use of insulin ([place]) - Plan: CBC with Differential/Platelet, COMPLETE METABOLIC PANEL WITH GFR, Lipid panel  Diabetes  mellitus without complication ([place]) - Plan: POCT HgB A1C  Need for pneumococcal vaccination  Fall, subsequent encounter - Plan: VITAMIN D 25 Hydroxy (Vit-D Deficiency, Fractures), DG Bone Density  Fracture of ankle, bimalleolar, right, closed, with routine healing, subsequent encounter - Plan: DG Bone Density    Return in about 6 months (around 07/11/2016), or sooner if needed, for DM2 FOLLOWUP.

## 2016-01-13 LAB — VITAMIN D 25 HYDROXY (VIT D DEFICIENCY, FRACTURES): Vit D, 25-Hydroxy: 43 ng/mL (ref 30–100)

## 2016-01-21 ENCOUNTER — Encounter: Payer: Self-pay | Admitting: Sports Medicine

## 2016-01-21 ENCOUNTER — Ambulatory Visit (INDEPENDENT_AMBULATORY_CARE_PROVIDER_SITE_OTHER): Payer: Managed Care, Other (non HMO) | Admitting: Sports Medicine

## 2016-01-21 DIAGNOSIS — E669 Obesity, unspecified: Secondary | ICD-10-CM

## 2016-01-21 MED ORDER — PHENTERMINE HCL 37.5 MG PO TABS: ORAL_TABLET | ORAL | Status: DC

## 2016-01-21 NOTE — Assessment & Plan Note (Signed)
Restarting phentermine, we will do a full 6 months from here.  Return monthly for weight checks and refills.

## 2016-01-21 NOTE — Progress Notes (Signed)
  Subjective:    CC: Follow-up  HPI: Obesity: Fantastic initial weight loss unfortunately suffered a bimalleolar fracture needing ORIF. We held off on phentermine as he heals. He is ready to restart.  Ankle fracture: Doing well post ORIF, has 4 more weeks of follow-up with Dr. Berenice Primas.  Past medical history, Surgical history, Family history not pertinant except as noted below, Social history, Allergies, and medications have been entered into the medical record, reviewed, and no changes needed.   Review of Systems: No fevers, chills, night sweats, weight loss, chest pain, or shortness of breath.   Objective:    General: Well Developed, well nourished, and in no acute distress.  Neuro: Alert and oriented x3, extra-ocular muscles intact, sensation grossly intact.  HEENT: Normocephalic, atraumatic, pupils equal round reactive to light, neck supple, no masses, no lymphadenopathy, thyroid nonpalpable.  Skin: Warm and dry, no rashes. Cardiac: Regular rate and rhythm, no murmurs rubs or gallops, no lower extremity edema.  Respiratory: Clear to auscultation bilaterally. Not using accessory muscles, speaking in full sentences.  Impression and Recommendations:

## 2016-02-03 ENCOUNTER — Other Ambulatory Visit: Payer: Self-pay | Admitting: Sports Medicine

## 2016-02-18 ENCOUNTER — Encounter: Payer: Self-pay | Admitting: Sports Medicine

## 2016-02-18 ENCOUNTER — Ambulatory Visit (INDEPENDENT_AMBULATORY_CARE_PROVIDER_SITE_OTHER): Payer: Managed Care, Other (non HMO) | Admitting: Sports Medicine

## 2016-02-18 VITALS — BP 117/80 | HR 66 | Resp 16 | Wt 233.1 lb

## 2016-02-18 DIAGNOSIS — E669 Obesity, unspecified: Secondary | ICD-10-CM

## 2016-02-18 MED ORDER — PHENTERMINE HCL 37.5 MG PO TABS: ORAL_TABLET | ORAL | Status: DC

## 2016-02-18 NOTE — Progress Notes (Signed)
  Subjective:    CC: Follow-up  HPI: Obesity: 2 pound weight loss after the first month on phentermine.  Past medical history, Surgical history, Family history not pertinant except as noted below, Social history, Allergies, and medications have been entered into the medical record, reviewed, and no changes needed.   Review of Systems: No fevers, chills, night sweats, weight loss, chest pain, or shortness of breath.   Objective:    General: Well Developed, well nourished, and in no acute distress.  Neuro: Alert and oriented x3, extra-ocular muscles intact, sensation grossly intact.  HEENT: Normocephalic, atraumatic, pupils equal round reactive to light, neck supple, no masses, no lymphadenopathy, thyroid nonpalpable.  Skin: Warm and dry, no rashes. Cardiac: Regular rate and rhythm, no murmurs rubs or gallops, no lower extremity edema.  Respiratory: Clear to auscultation bilaterally. Not using accessory muscles, speaking in full sentences.  Impression and Recommendations:

## 2016-02-18 NOTE — Assessment & Plan Note (Signed)
Only 2 pound weight loss after the first month, refilling phentermine, return in one month. Continue Topamax.

## 2016-02-29 ENCOUNTER — Other Ambulatory Visit: Payer: Self-pay | Admitting: Sports Medicine

## 2016-03-03 ENCOUNTER — Other Ambulatory Visit (INDEPENDENT_AMBULATORY_CARE_PROVIDER_SITE_OTHER): Payer: Managed Care, Other (non HMO)

## 2016-03-03 DIAGNOSIS — D352 Benign neoplasm of pituitary gland: Secondary | ICD-10-CM

## 2016-03-03 LAB — T4, FREE: Free T4: 0.83 ng/dL (ref 0.60–1.60)

## 2016-03-03 LAB — TESTOSTERONE: TESTOSTERONE: 245.74 ng/dL — AB (ref 300.00–890.00)

## 2016-03-03 LAB — CORTISOL: Cortisol, Plasma: 14.1 ug/dL

## 2016-03-05 LAB — INSULIN-LIKE GROWTH FACTOR: Insulin-Like GF-1: 130 ng/mL (ref 49–188)

## 2016-03-08 ENCOUNTER — Ambulatory Visit (INDEPENDENT_AMBULATORY_CARE_PROVIDER_SITE_OTHER): Payer: Managed Care, Other (non HMO) | Admitting: Endocrinology

## 2016-03-08 ENCOUNTER — Encounter: Payer: Self-pay | Admitting: Endocrinology

## 2016-03-08 VITALS — BP 126/84 | HR 76 | Temp 98.2°F | Resp 16 | Ht 72.0 in | Wt 233.4 lb

## 2016-03-08 DIAGNOSIS — D352 Benign neoplasm of pituitary gland: Secondary | ICD-10-CM

## 2016-03-08 LAB — SPECIMEN STATUS REPORT

## 2016-03-08 NOTE — Progress Notes (Signed)
Patient ID: William Webster, male   DOB: 1951/01/27, 65 y.o.   MRN: [id number]          Chief complaint: Follow-up of prolactinoma  History of Present Illness:   Prior history: He apparently had a blunt head injury in the year 1997 and a brain scan showed a pituitary  tumor.  He also apparently was having headaches prior to this.  Did not have any visual field problems. He says that his prolactin level was very high and his endocrinologist and neurosurgeon elected to treat him with Parlodel. Also for 4 months he was treated with testosterone patch even though he does not think he was having any sexual dysfunction No records are available from initial evaluation or follow-up  Since about 2015 he is taking Dostinex 0.25 mg twice a week which he takes on Mondays and Fridays  He does not complain of any headaches or blurred vision . He has had a  nonfasting prolactin of 18.3, no recent lab available Has been quite regular with taking his Dostinex twice a week  He thinks he had a normal eye exam in 2015 but records are not available  He has had no symptoms of heat or cold intolerance, decreased libido, erectile dysfunction, lightheadedness, nausea, significant weight change or fatigue  Although his testosterone level is low he does not complain of any decreased motivation or fatigue  Lab Results  Component Value Date   PROLACTIN 18.3* 08/16/2015   PROLACTIN 23.5 06/10/2015   Lab on 03/03/2016  Component Date Value Ref Range Status  . Testosterone 03/03/2016 245.74* 300.00 - 890.00 ng/dL Final  . Free T4 03/03/2016 0.83  0.60 - 1.60 ng/dL Final  . Cortisol, Plasma 03/03/2016 14.1   Final   AM:  4.3 - 22.4 ug/dLPM:  3.1 - 16.7 ug/dL  . Insulin-Like GF-1 03/03/2016 130  49 - 188 ng/mL Final     Past Medical History  Diagnosis Date  . Hyperlipidemia   . Diabetes mellitus without complication ([place])   . Gilbert disease   . Spondylosis of lumbar joint   . Prolactinoma ([place])   .  Anterior pituitary adenoma syndrome ([place])   . Colon polyp   . Family history of cancer of GI tract   . Lumbar scoliosis   . Low back pain   . Family history of colon cancer   . Diabetic neuropathy ([place])   . Sciatica of right side   . Anxiety   . Bimalleolar fracture of right ankle     Past Surgical History  Procedure Laterality Date  . Hemorrhoid surgery    . Tonsillectomy    . Orif ankle fracture Right 11/24/2015    Procedure: OPEN REDUCTION INTERNAL FIXATION (ORIF) ANKLE FRACTURE;  Surgeon: Dorna Leitz, MD;  Location: [facility];  Service: Orthopedics;  Laterality: Right;  Open reduction internal fixation right ankle fracture    Family History  Problem Relation Age of Onset  . Cancer Mother     colon  . Heart failure Mother   . Diabetes Mother   . Hyperlipidemia Mother   . Stroke Mother   . Stroke Father   . Hyperlipidemia Father   . Diabetes Maternal Grandfather   . Heart disease Maternal Grandfather   . Stroke Maternal Grandfather   . Diabetes Paternal Grandfather   . Heart disease Paternal Grandfather   . Stroke Paternal Grandfather     Social History:  reports that he has been smoking Cigars.  He has never  used smokeless tobacco. He reports that he drinks about 6.0 oz of alcohol per week. He reports that he does not use illicit drugs.  Allergies: No Known Allergies    Medication List       This list is accurate as of: 03/08/16 10:39 AM.  Always use your most recent med list.               cabergoline 0.5 MG tablet  Commonly known as:  DOSTINEX  Take 0.5 tablets (0.25 mg total) by mouth 2 (two) times a week.     Empagliflozin-Linagliptin 25-5 MG Tabs  Commonly known as:  GLYXAMBI  Take 1 tablet by mouth daily.     glucose blood test strip  Check blood sugar twice daily. Diagnosis DM ICD 10 - E11.9     meloxicam 15 MG tablet  Commonly known as:  MOBIC  TAKE 1 TABLET BY MOUTH EVERY MORINING WITH BREAKFAST FOR 2 WEEKS, THEN DAILY AS  NEEDED FOR PAIN     metFORMIN 1000 MG tablet  Commonly known as:  GLUCOPHAGE  Take 1 tablet (1,000 mg total) by mouth 2 (two) times daily with a meal.     phentermine 37.5 MG tablet  Commonly known as:  ADIPEX-P  One tab by mouth qAM     pioglitazone 15 MG tablet  Commonly known as:  ACTOS  Take 15 mg by mouth daily.     rosuvastatin 40 MG tablet  Commonly known as:  CRESTOR  Take 1 tablet (40 mg total) by mouth daily.     topiramate 200 MG tablet  Commonly known as:  TOPAMAX  TAKE 1 TABLET BY MOUTH EVERY DAY         REVIEW OF SYSTEMS:        DIABETES: He thinks this was diagnosed in about the year 2002 With taking phentermine from his PCP and being more active he has lost weight Also on combination of Jardiance and Tradjenta currently in addition to Actos and metformin with improved results   Lab Results  Component Value Date   HGBA1C 6.5 01/12/2016   HGBA1C 8.0 10/01/2015   HGBA1C 10.1* 08/16/2015   Lab Results  Component Value Date   MICROALBUR 2.0 08/16/2015   LDLCALC 91 01/12/2016   CREATININE 0.97 01/12/2016   OBESITY: He has had phentermine and topiramate prescribed by another physician Since early 2016   Wt Readings from Last 3 Encounters:  03/08/16 233 lb 6.4 oz (105.87 kg)  02/18/16 233 lb 1.6 oz (105.733 kg)  01/21/16 235 lb 6.4 oz (106.777 kg)       PHYSICAL EXAM:  BP 126/84 mmHg  Pulse 76  Temp(Src) 98.2 F (36.8 C)  Resp 16  Ht 6' (1.829 m)  Wt 233 lb 6.4 oz (105.87 kg)  BMI 31.65 kg/m2  SpO2 98%  Exam not indicated  ASSESSMENT:   PROLACTINOMA by history. He has had a macroprolactinoma treated with either bromocriptine or cabergoline since 1997 with good results Again no previous records available including size of his pituitary tumor   He symptomatically is doing fairly well and does not complain of any symptoms suggestive of testosterone deficiency even though his level is relatively low considering his age He is not keen on  taking testosterone supplement at present, discussed long-term effects of testosterone deficiency   PLAN:    Will add on prolactin level and decide on his dose of cabergoline  Check free testosterone level on the next visit.  His testosterone level  may improve further with continued weight loss  Will get reports of previous MRI scans done at [facility],   [facility] 03/08/2016, 10:39 AM

## 2016-03-09 ENCOUNTER — Other Ambulatory Visit: Payer: Self-pay | Admitting: *Deleted

## 2016-03-09 ENCOUNTER — Telehealth: Payer: Self-pay

## 2016-03-09 DIAGNOSIS — E221 Hyperprolactinemia: Secondary | ICD-10-CM

## 2016-03-09 DIAGNOSIS — Z794 Long term (current) use of insulin: Principal | ICD-10-CM

## 2016-03-09 DIAGNOSIS — E119 Type 2 diabetes mellitus without complications: Secondary | ICD-10-CM

## 2016-03-09 MED ORDER — CABERGOLINE 0.5 MG PO TABS: 0.2500 mg | ORAL_TABLET | ORAL | Status: DC

## 2016-03-09 NOTE — Telephone Encounter (Signed)
Totally ok

## 2016-03-09 NOTE — Progress Notes (Signed)
Quick Note:  Please let patient know that theprolactin level is high and we will need to increase his cabergoline up to the full tablet of 0.5 mg twice a week, needs new prescription ______

## 2016-03-10 LAB — SPECIMEN STATUS REPORT

## 2016-03-10 LAB — PROLACTIN: PROLACTIN: 38.5 ng/mL — AB (ref 4.0–15.2)

## 2016-03-10 MED ORDER — PIOGLITAZONE HCL 15 MG PO TABS: 15.0000 mg | ORAL_TABLET | Freq: Every day | ORAL | Status: DC

## 2016-03-17 ENCOUNTER — Ambulatory Visit (INDEPENDENT_AMBULATORY_CARE_PROVIDER_SITE_OTHER): Payer: Managed Care, Other (non HMO) | Admitting: Sports Medicine

## 2016-03-17 ENCOUNTER — Other Ambulatory Visit: Payer: Self-pay

## 2016-03-17 ENCOUNTER — Encounter: Payer: Self-pay | Admitting: Sports Medicine

## 2016-03-17 VITALS — BP 105/64 | HR 67 | Resp 18 | Wt 228.9 lb

## 2016-03-17 DIAGNOSIS — L918 Other hypertrophic disorders of the skin: Secondary | ICD-10-CM

## 2016-03-17 DIAGNOSIS — E669 Obesity, unspecified: Secondary | ICD-10-CM | POA: Diagnosis not present

## 2016-03-17 DIAGNOSIS — Z794 Long term (current) use of insulin: Principal | ICD-10-CM

## 2016-03-17 DIAGNOSIS — E119 Type 2 diabetes mellitus without complications: Secondary | ICD-10-CM

## 2016-03-17 MED ORDER — PIOGLITAZONE HCL 15 MG PO TABS: 15.0000 mg | ORAL_TABLET | Freq: Every day | ORAL | Status: DC

## 2016-03-17 MED ORDER — PHENTERMINE HCL 37.5 MG PO TABS: ORAL_TABLET | ORAL | Status: DC

## 2016-03-17 NOTE — Assessment & Plan Note (Signed)
Good additional weight loss after the second month of phentermine, refilling medication, return in one month.

## 2016-03-17 NOTE — Progress Notes (Signed)
  Subjective:    CC: Follow-up  HPI: Obesity: Has dropped an additional 5 pounds.  Skin tags: Multiple of the head, neck, desires cryotherapy.  Past medical history, Surgical history, Family history not pertinant except as noted below, Social history, Allergies, and medications have been entered into the medical record, reviewed, and no changes needed.   Review of Systems: No fevers, chills, night sweats, weight loss, chest pain, or shortness of breath.   Objective:    General: Well Developed, well nourished, and in no acute distress.  Neuro: Alert and oriented x3, extra-ocular muscles intact, sensation grossly intact.  HEENT: Normocephalic, atraumatic, pupils equal round reactive to light, neck supple, no masses, no lymphadenopathy, thyroid nonpalpable.  Skin: Warm and dry, Multiple skin tags on the head and neck Cardiac: Regular rate and rhythm, no murmurs rubs or gallops, no lower extremity edema.  Respiratory: Clear to auscultation bilaterally. Not using accessory muscles, speaking in full sentences.  Procedure:  Cryodestruction of approximately 10 cutaneous skin tags on the head and neck Consent obtained and verified. Time-out conducted. Noted no overlying erythema, induration, or other signs of local infection. Completed without difficulty using Cryo-Gun. Advised to call if fevers/chills, erythema, induration, drainage, or persistent bleeding.  Impression and Recommendations:

## 2016-03-17 NOTE — Assessment & Plan Note (Signed)
Cryotherapy of approximately 10 skin tags on the head and neck.

## 2016-03-17 NOTE — Telephone Encounter (Signed)
Patient requested a 90 day supply for Actos sent to [facility]. Rhonda Cunningham,CMA

## 2016-03-27 ENCOUNTER — Other Ambulatory Visit: Payer: Self-pay | Admitting: Sports Medicine

## 2016-04-03 ENCOUNTER — Other Ambulatory Visit: Payer: Self-pay | Admitting: Sports Medicine

## 2016-04-10 ENCOUNTER — Other Ambulatory Visit: Payer: Self-pay | Admitting: Osteopathic Medicine

## 2016-04-14 ENCOUNTER — Encounter: Payer: Self-pay | Admitting: Sports Medicine

## 2016-04-14 ENCOUNTER — Ambulatory Visit (INDEPENDENT_AMBULATORY_CARE_PROVIDER_SITE_OTHER): Payer: Managed Care, Other (non HMO) | Admitting: Sports Medicine

## 2016-04-14 ENCOUNTER — Telehealth: Payer: Self-pay

## 2016-04-14 VITALS — BP 129/82 | HR 80 | Resp 18 | Wt 227.4 lb

## 2016-04-14 DIAGNOSIS — E669 Obesity, unspecified: Secondary | ICD-10-CM

## 2016-04-14 MED ORDER — ROSUVASTATIN CALCIUM 40 MG PO TABS: 40.0000 mg | ORAL_TABLET | Freq: Every day | ORAL | Status: DC

## 2016-04-14 MED ORDER — PHENTERMINE HCL 37.5 MG PO TABS
ORAL_TABLET | ORAL | Status: DC
Start: 2016-04-14 — End: 2016-05-18

## 2016-04-14 NOTE — Telephone Encounter (Signed)
Patient request refill for Crestor 40 mg #90 [id number] refills sent to [facility]. Rhonda Cunningham,CMA

## 2016-04-14 NOTE — Progress Notes (Signed)
  Subjective:    CC:  Weight check  HPI:  good continued weight loss after 3 months of phentermine. No side effects, happy.  Past medical history, Surgical history, Family history not pertinant except as noted below, Social history, Allergies, and medications have been entered into the medical record, reviewed, and no changes needed.   Review of Systems: No fevers, chills, night sweats, weight loss, chest pain, or shortness of breath.   Objective:    General: Well Developed, well nourished, and in no acute distress.  Neuro: Alert and oriented x3, extra-ocular muscles intact, sensation grossly intact.  HEENT: Normocephalic, atraumatic, pupils equal round reactive to light, neck supple, no masses, no lymphadenopathy, thyroid nonpalpable.  Skin: Warm and dry, no rashes. Cardiac: Regular rate and rhythm, no murmurs rubs or gallops, no lower extremity edema.  Respiratory: Clear to auscultation bilaterally. Not using accessory muscles, speaking in full sentences.  Impression and Recommendations:

## 2016-04-14 NOTE — Assessment & Plan Note (Signed)
Good continued weight loss after 3 months of phentermine, refilling medication, return in one month.

## 2016-04-27 LAB — HM DIABETES EYE EXAM

## 2016-05-01 ENCOUNTER — Other Ambulatory Visit: Payer: Self-pay | Admitting: Sports Medicine

## 2016-05-18 ENCOUNTER — Ambulatory Visit (INDEPENDENT_AMBULATORY_CARE_PROVIDER_SITE_OTHER): Payer: Managed Care, Other (non HMO) | Admitting: Sports Medicine

## 2016-05-18 ENCOUNTER — Encounter: Payer: Self-pay | Admitting: Sports Medicine

## 2016-05-18 VITALS — BP 110/74 | HR 80 | Resp 18 | Wt 229.3 lb

## 2016-05-18 DIAGNOSIS — E119 Type 2 diabetes mellitus without complications: Secondary | ICD-10-CM

## 2016-05-18 DIAGNOSIS — E669 Obesity, unspecified: Secondary | ICD-10-CM

## 2016-05-18 LAB — HEMOGLOBIN A1C
Hgb A1c MFr Bld: 6.5 % — ABNORMAL HIGH (ref <5.7)
Mean Plasma Glucose: 140 mg/dL

## 2016-05-18 MED ORDER — PHENTERMINE HCL 37.5 MG PO TABS: ORAL_TABLET | ORAL | Status: DC

## 2016-05-18 NOTE — Assessment & Plan Note (Signed)
2 pound weight gain, we're entering the fourth month.

## 2016-05-18 NOTE — Progress Notes (Signed)
  Subjective:    CC: weight check  HPI: Obesity: Has gained 2 pounds.  Diabetes mellitus type II: Desires to recheck A1c today.  Past medical history, Surgical history, Family history not pertinant except as noted below, Social history, Allergies, and medications have been entered into the medical record, reviewed, and no changes needed.   Review of Systems: No fevers, chills, night sweats, weight loss, chest pain, or shortness of breath.   Objective:    General: Well Developed, well nourished, and in no acute distress.  Neuro: Alert and oriented x3, extra-ocular muscles intact, sensation grossly intact.  HEENT: Normocephalic, atraumatic, pupils equal round reactive to light, neck supple, no masses, no lymphadenopathy, thyroid nonpalpable.  Skin: Warm and dry, no rashes. Cardiac: Regular rate and rhythm, no murmurs rubs or gallops, no lower extremity edema.  Respiratory: Clear to auscultation bilaterally. Not using accessory muscles, speaking in full sentences.  Impression and Recommendations:   I spent 25 minutes with this patient, greater than 50% was face-to-face time counseling regarding the above diagnoses

## 2016-06-01 ENCOUNTER — Other Ambulatory Visit: Payer: Self-pay | Admitting: Sports Medicine

## 2016-06-05 ENCOUNTER — Other Ambulatory Visit (INDEPENDENT_AMBULATORY_CARE_PROVIDER_SITE_OTHER): Payer: Managed Care, Other (non HMO)

## 2016-06-05 DIAGNOSIS — D352 Benign neoplasm of pituitary gland: Secondary | ICD-10-CM

## 2016-06-05 LAB — LUTEINIZING HORMONE: LH: 7.8 m[IU]/mL (ref 1.50–9.30)

## 2016-06-06 LAB — TESTOSTERONE, FREE, TOTAL, SHBG
SEX HORMONE BINDING: 37.8 nmol/L (ref 19.3–76.4)
TESTOSTERONE FREE: 8.7 pg/mL (ref 6.6–18.1)
Testosterone: 289 ng/dL — ABNORMAL LOW (ref 348–1197)

## 2016-06-06 LAB — PROLACTIN: PROLACTIN: 31.6 ng/mL — AB (ref 4.0–15.2)

## 2016-06-08 ENCOUNTER — Encounter: Payer: Self-pay | Admitting: Endocrinology

## 2016-06-08 ENCOUNTER — Ambulatory Visit (INDEPENDENT_AMBULATORY_CARE_PROVIDER_SITE_OTHER): Payer: Managed Care, Other (non HMO) | Admitting: Endocrinology

## 2016-06-08 VITALS — BP 116/68 | HR 77 | Wt 226.0 lb

## 2016-06-08 DIAGNOSIS — D352 Benign neoplasm of pituitary gland: Secondary | ICD-10-CM | POA: Diagnosis not present

## 2016-06-08 LAB — SPECIMEN STATUS REPORT

## 2016-06-08 NOTE — Progress Notes (Addendum)
Patient ID: William Webster, male   DOB: 08/26/1951, 65 y.o.   MRN: [id number]          Chief complaint: Follow-up of prolactinoma  History of Present Illness:   Prior history: He apparently had a blunt head injury in the year 1997 and a brain scan showed a pituitary  tumor.  He also apparently was having headaches prior to this.  Did not have any visual field problems. He says that his prolactin level was very high and his endocrinologist and neurosurgeon elected to treat him with Parlodel. Also for 4 months he was treated with testosterone patch even though he does not think he was having any sexual dysfunction No records are available from initial evaluation or follow-up  Since about 2015 he is taking Dostinex twice a week which he takes on Mondays and Fridays  RECENT history: He does not complain of any headaches or blurred vision  He thinks he had a normal eye exam 3 weeks ago but report is not available His MRI films from [place] in 1999 were reviewed today which he had brought in to review and showed an approximately 1.5 cm tumor, initially with some suprasellar  extension.  His MRI from 2007 is not available . He has had a  higher prolactin in 2017 and his Dostinex was increased to 0.5 mg twice a week His prolactin is now 31.6, previously 38.5 Has been quite regular with taking his Dostinex twice a week  Although his total testosterone level is low his free testosterone is normal Does not complain of any unusual fatigue  In 3/17 his pituitary functions were otherwise normal  Lab Results  Component Value Date   PROLACTIN 31.6* 06/05/2016   PROLACTIN 38.5* 03/03/2016   PROLACTIN 18.3* 08/16/2015   PROLACTIN 23.5 06/10/2015   Lab on 06/05/2016  Component Date Value Ref Range Status  . Testosterone 06/05/2016 289* 348 - 1197 ng/dL Final   Comment: **Effective July 10, 2016 the reference interval for**   Testosterone, Serum Males >46 years old will be   changing to:            Adult Males:      264 - 916   . Comment, Testosterone 06/05/2016 Comment   Final   Comment: Adult male reference interval is based on a population of lean males up to 63 years old.   . Testosterone, Free 06/05/2016 8.7  6.6 - 18.1 pg/mL Final  . Sex Hormone Binding 06/05/2016 37.8  19.3 - 76.4 nmol/L Final  . Prolactin 06/05/2016 31.6* 4.0 - 15.2 ng/mL Final  . LH 06/05/2016 7.80  1.50 - 9.30 mIU/mL Final   Comment: Male Reference Range:20-70 yrs     1.5-9.3 mIU/mL>70 yrs       3.1-35.6 mIU/mLFemale Reference Range:Follicular Phase     [id number] mIU/mLMidcycle             8.7-76.3 mIU/mLLuteal Phase         0.5-16.9 mIU/mL  Post Menopausal      15.9-54.0  mIU/mLPregnant             <1.5 mIU/mLContraceptives       0.7-5.6 mIU/mL      Past Medical History  Diagnosis Date  . Hyperlipidemia   . Diabetes mellitus without complication ([place])   . Gilbert disease   . Spondylosis of lumbar joint   . Prolactinoma ([place])   . Anterior pituitary adenoma syndrome ([place])   . Colon polyp   . Family history of  cancer of GI tract   . Lumbar scoliosis   . Low back pain   . Family history of colon cancer   . Diabetic neuropathy ([place])   . Sciatica of right side   . Anxiety   . Bimalleolar fracture of right ankle     Past Surgical History  Procedure Laterality Date  . Hemorrhoid surgery    . Tonsillectomy    . Orif ankle fracture Right 11/24/2015    Procedure: OPEN REDUCTION INTERNAL FIXATION (ORIF) ANKLE FRACTURE;  Surgeon: Dorna Leitz, MD;  Location: [facility];  Service: Orthopedics;  Laterality: Right;  Open reduction internal fixation right ankle fracture    Family History  Problem Relation Age of Onset  . Cancer Mother     colon  . Heart failure Mother   . Diabetes Mother   . Hyperlipidemia Mother   . Stroke Mother   . Stroke Father   . Hyperlipidemia Father   . Diabetes Maternal Grandfather   . Heart disease Maternal Grandfather   . Stroke Maternal Grandfather     . Diabetes Paternal Grandfather   . Heart disease Paternal Grandfather   . Stroke Paternal Grandfather     Social History:  reports that he has been smoking Cigars.  He has never used smokeless tobacco. He reports that he drinks about 6.0 oz of alcohol per week. He reports that he does not use illicit drugs.  Allergies: No Known Allergies    Medication List       This list is accurate as of: 06/08/16 11:01 AM.  Always use your most recent med list.               cabergoline 0.5 MG tablet  Commonly known as:  DOSTINEX  Take 0.5 tablets (0.25 mg total) by mouth 2 (two) times a week. Take 1 tablet twice a week     Empagliflozin-Linagliptin 25-5 MG Tabs  Commonly known as:  GLYXAMBI  Take 1 tablet by mouth daily.     glucose blood test strip  Check blood sugar twice daily. Diagnosis DM ICD 10 - E11.9     meloxicam 15 MG tablet  Commonly known as:  MOBIC  TAKE 1 TABLET BY MOUTH EVERY MORINING WITH BREAKFAST FOR 2 WEEKS, THEN DAILY AS NEEDED FOR PAIN     metFORMIN 1000 MG tablet  Commonly known as:  GLUCOPHAGE  TAKE 1 TABLET BY MOUTH BID WITH MEAL     phentermine 37.5 MG tablet  Commonly known as:  ADIPEX-P  One tab by mouth qAM     pioglitazone 15 MG tablet  Commonly known as:  ACTOS  Take 1 tablet (15 mg total) by mouth daily.     rosuvastatin 40 MG tablet  Commonly known as:  CRESTOR  Take 1 tablet (40 mg total) by mouth daily.     topiramate 200 MG tablet  Commonly known as:  TOPAMAX  TAKE 1 TABLET BY MOUTH EVERY DAY         REVIEW OF SYSTEMS:        DIABETES: He thinks this was diagnosed in about the year 2002 Treated with combination of Jardiance and Tradjenta  in addition to Actos and metformin with better control in 2017  With taking phentermine and Topamax from his PCP and being more active he had lost weight and apparently is going to stop this in the Next month  His glucose readings were reviewed from monitor download FASTING blood sugar range  117-188,  around 7-8 PM  have been 96-145, before supper Overall MEDIAN 136, higher in the morning   Lab Results  Component Value Date   HGBA1C 6.5* 05/18/2016   HGBA1C 6.5 01/12/2016   HGBA1C 8.0 10/01/2015   Lab Results  Component Value Date   MICROALBUR 2.0 08/16/2015   LDLCALC 91 01/12/2016   CREATININE 0.97 01/12/2016   OBESITY: He has had phentermine and topiramate prescribed by another physician Since early 2016, Apparently he is going to stop this.  He thinks he is doing much better and his diet also    Wt Readings from Last 3 Encounters:  06/08/16 226 lb (102.513 kg)  05/18/16 229 lb 4.8 oz (104.01 kg)  04/14/16 227 lb 6.4 oz (103.148 kg)       PHYSICAL EXAM:  BP 116/68 mmHg  Pulse 77  Wt 226 lb (102.513 kg)  SpO2 94%   ASSESSMENT:   PROLACTINOMA, Long-standing He has had a macroprolactinoma treated with either bromocriptine or cabergoline since 1997    He symptomatically is doing fairly well and does not complain of any symptoms suggestive of testosterone deficiency Free testosterone is normal now; however his prolactin is still relatively high  Has no evidence of hypopituitarism Reportedly his eye exam is fairly good recently, Will review the report when available as he is not clear whether he had visual fields done MRI films from previous exams was reviewed today   PLAN:   He will need another MRI to follow-up on the size of his macro prolactinoma and if this is the same or smaller will not need to increase his Dostinex, currently on 0.5 mg twice a week He will take his old MRIs for comparison with him  DIABETES: His blood sugars are fairly well controlled although relatively higher fasting He does need to do more readings after supper Discussed that taking high-dose phentermine is not recommended long-term and he will discuss with PCP  Total evaluation and management time, review of old films, labs and counseling = 25 minute  Dornell Grasmick 06/08/2016,  11:01 AM

## 2016-06-08 NOTE — Addendum Note (Signed)
Addended by: Elayne Snare on: 06/08/2016 11:15 AM   Modules accepted: Level of Service

## 2016-06-09 ENCOUNTER — Telehealth: Payer: Self-pay | Admitting: Endocrinology

## 2016-06-09 LAB — BASIC METABOLIC PANEL
BUN/Creatinine Ratio: 18 (ref 10–24)
BUN: 19 mg/dL (ref 8–27)
CO2: 19 mmol/L (ref 18–29)
CREATININE: 1.03 mg/dL (ref 0.76–1.27)
Calcium: 9.4 mg/dL (ref 8.6–10.2)
Chloride: 105 mmol/L (ref 96–106)
GFR calc Af Amer: 89 mL/min/{1.73_m2} (ref >59)
GFR calc non Af Amer: 77 mL/min/{1.73_m2} (ref >59)
GLUCOSE: 157 mg/dL — AB (ref 65–99)
Potassium: 5.4 mmol/L — ABNORMAL HIGH (ref 3.5–5.2)
SODIUM: 142 mmol/L (ref 134–144)

## 2016-06-09 LAB — SPECIMEN STATUS REPORT

## 2016-06-09 NOTE — Telephone Encounter (Signed)
I contacted the pt and left a vm advising of note below.

## 2016-06-09 NOTE — Telephone Encounter (Signed)
Please let patient know that his potassium level was high, may be related to his taking meloxicam and needs to stop this if possible His kidney function report will be needed by radiology scheduling for his MRI

## 2016-06-14 ENCOUNTER — Other Ambulatory Visit: Payer: Self-pay | Admitting: Endocrinology

## 2016-06-15 ENCOUNTER — Other Ambulatory Visit: Payer: Self-pay | Admitting: Endocrinology

## 2016-06-16 ENCOUNTER — Ambulatory Visit
Admission: RE | Admit: 2016-06-16 | Discharge: 2016-06-16 | Disposition: A | Discharge status: Home / Self Care | Payer: Managed Care, Other (non HMO) | Source: Ambulatory Visit | Attending: Endocrinology | Admitting: Endocrinology

## 2016-06-16 DIAGNOSIS — D352 Benign neoplasm of pituitary gland: Secondary | ICD-10-CM

## 2016-06-16 MED ORDER — GADOBENATE DIMEGLUMINE 529 MG/ML IV SOLN: 10.0000 mL | Freq: Once | INTRAVENOUS | Status: DC | PRN

## 2016-06-22 ENCOUNTER — Encounter: Payer: Self-pay | Admitting: Sports Medicine

## 2016-06-22 ENCOUNTER — Ambulatory Visit (INDEPENDENT_AMBULATORY_CARE_PROVIDER_SITE_OTHER): Payer: Managed Care, Other (non HMO) | Admitting: Sports Medicine

## 2016-06-22 ENCOUNTER — Other Ambulatory Visit: Payer: Self-pay | Admitting: Osteopathic Medicine

## 2016-06-22 VITALS — BP 109/78 | HR 96 | Wt 226.0 lb

## 2016-06-22 DIAGNOSIS — Z794 Long term (current) use of insulin: Principal | ICD-10-CM

## 2016-06-22 DIAGNOSIS — E119 Type 2 diabetes mellitus without complications: Secondary | ICD-10-CM

## 2016-06-22 DIAGNOSIS — E669 Obesity, unspecified: Secondary | ICD-10-CM

## 2016-06-22 MED ORDER — PIOGLITAZONE HCL 15 MG PO TABS: 15.0000 mg | ORAL_TABLET | Freq: Every day | ORAL | Status: DC

## 2016-06-22 MED ORDER — PHENTERMINE HCL 37.5 MG PO TABS: ORAL_TABLET | ORAL | Status: DC

## 2016-06-22 NOTE — Progress Notes (Signed)
  Subjective:    CC: Follow-up  HPI: Obesity: Good continued weight loss on phentermine, entering the fifth month.  Past medical history, Surgical history, Family history not pertinant except as noted below, Social history, Allergies, and medications have been entered into the medical record, reviewed, and no changes needed.   Review of Systems: No fevers, chills, night sweats, weight loss, chest pain, or shortness of breath.   Objective:    General: Well Developed, well nourished, and in no acute distress.  Neuro: Alert and oriented x3, extra-ocular muscles intact, sensation grossly intact.  HEENT: Normocephalic, atraumatic, pupils equal round reactive to light, neck supple, no masses, no lymphadenopathy, thyroid nonpalpable.  Skin: Warm and dry, no rashes. Cardiac: Regular rate and rhythm, no murmurs rubs or gallops, no lower extremity edema.  Respiratory: Clear to auscultation bilaterally. Not using accessory muscles, speaking in full sentences.  Impression and Recommendations:

## 2016-06-22 NOTE — Assessment & Plan Note (Signed)
Good but slow continued weight loss, refilling phentermine, we are entering the fifth month, we will do this and a cecal additional month at the full dose before dropping to the half dose to taper off.

## 2016-07-03 ENCOUNTER — Other Ambulatory Visit: Payer: Self-pay | Admitting: Osteopathic Medicine

## 2016-07-03 ENCOUNTER — Other Ambulatory Visit: Payer: Self-pay | Admitting: Sports Medicine

## 2016-07-04 ENCOUNTER — Ambulatory Visit (INDEPENDENT_AMBULATORY_CARE_PROVIDER_SITE_OTHER): Payer: Managed Care, Other (non HMO) | Admitting: Osteopathic Medicine

## 2016-07-04 ENCOUNTER — Encounter: Payer: Self-pay | Admitting: Osteopathic Medicine

## 2016-07-04 VITALS — BP 112/75 | HR 65 | Ht 72.0 in | Wt 227.0 lb

## 2016-07-04 DIAGNOSIS — E119 Type 2 diabetes mellitus without complications: Secondary | ICD-10-CM

## 2016-07-04 LAB — POCT GLYCOSYLATED HEMOGLOBIN (HGB A1C): HEMOGLOBIN A1C: 6.1

## 2016-07-04 NOTE — Patient Instructions (Addendum)
For inexpensive medications: look up GoodRx.com, can try the drug manufacturer's web sites also for savings programs.   Plan to come back in 6 months (12/2016) for routine labs - get these done at least 2 - 3 days before your appointment, fating - and annual wellness checkup.

## 2016-07-04 NOTE — Progress Notes (Signed)
HPI: William Webster is a 65 y.o. Not Hispanic or Latino male  who presents to [facility] today, 07/04/2016,  for chief complaint of:  Chief Complaint  Patient presents with  . Follow-up    DIABETES     DIABETES SCREENING/PREVENTIVE CARE: updated 07/04/2016 A1C past 3-6 mos: Yes  controlled? Yes 07/04/2016 6.1% BP goal <140/90: Yes  LDL goal <70: 91 in 12/2015 Eye exam annually: Yes (04/2016), need records.  Foot exam: 01/12/2016 Microalbuminuria: 08/16/2015 negative Metformin: Yes  ACE/ARB: No - BP good  Antiplatelet if ASCVD Risk >10%: No  Statin: Yes   End of 07/2016 loses COBRA, will be on his wife's policy at that point.   OBESITY - managed by Dr. Darene Lamer  PROLACTINOMA - stable per patient, following with endocrine     Past medical, surgical, social and family history reviewed: Past Medical History  Diagnosis Date  . Hyperlipidemia   . Diabetes mellitus without complication ([place])   . Gilbert disease   . Spondylosis of lumbar joint   . Prolactinoma ([place])   . Anterior pituitary adenoma syndrome ([place])   . Colon polyp   . Family history of cancer of GI tract   . Lumbar scoliosis   . Low back pain   . Family history of colon cancer   . Diabetic neuropathy ([place])   . Sciatica of right side   . Anxiety   . Bimalleolar fracture of right ankle    Past Surgical History  Procedure Laterality Date  . Hemorrhoid surgery    . Tonsillectomy    . Orif ankle fracture Right 11/24/2015    Procedure: OPEN REDUCTION INTERNAL FIXATION (ORIF) ANKLE FRACTURE;  Surgeon: Dorna Leitz, MD;  Location: [facility];  Service: Orthopedics;  Laterality: Right;  Open reduction internal fixation right ankle fracture   Social History  Substance Use Topics  . Smoking status: Light Tobacco Smoker    Types: Cigars  . Smokeless tobacco: Never Used     Comment: occassional cigar  . Alcohol Use: 6.0 oz/week    10 Standard drinks or equivalent  per week     Comment: social 12-20 drinks a week   Family History  Problem Relation Age of Onset  . Cancer Mother     colon  . Heart failure Mother   . Diabetes Mother   . Hyperlipidemia Mother   . Stroke Mother   . Stroke Father   . Hyperlipidemia Father   . Diabetes Maternal Grandfather   . Heart disease Maternal Grandfather   . Stroke Maternal Grandfather   . Diabetes Paternal Grandfather   . Heart disease Paternal Grandfather   . Stroke Paternal Grandfather      Current medication list and allergy/intolerance information reviewed:   Current Outpatient Prescriptions  Medication Sig Dispense Refill  . cabergoline (DOSTINEX) 0.5 MG tablet Take 0.5 tablets (0.25 mg total) by mouth 2 (two) times a week. Take 1 tablet twice a week 30 tablet 3  . Empagliflozin-Linagliptin (GLYXAMBI) 25-5 MG TABS Take 1 tablet by mouth daily. 30 tablet 11  . glucose blood test strip Check blood sugar twice daily. Diagnosis DM ICD 10 - E11.9 100 each 12  . meloxicam (MOBIC) 15 MG tablet TAKE 1 TABLET BY MOUTH EVERY MORINING WITH BREAKFAST FOR 2 WEEKS, THEN DAILY AS NEEDED FOR PAIN 30 tablet 2  . metFORMIN (GLUCOPHAGE) 1000 MG tablet Take 1 tablet (1,000 mg total) by mouth 2 (two) times daily with a meal. APPOINTMENT  NEEDED FOR FURTHER REFILLS 60 tablet 0  . phentermine (ADIPEX-P) 37.5 MG tablet One tab by mouth qAM 30 tablet 0  . pioglitazone (ACTOS) 15 MG tablet Take 1 tablet (15 mg total) by mouth daily. 90 tablet 3  . rosuvastatin (CRESTOR) 40 MG tablet Take 1 tablet (40 mg total) by mouth daily. 90 tablet 0  . topiramate (TOPAMAX) 200 MG tablet TAKE 1 TABLET BY MOUTH EVERY DAY 30 tablet 0   No current facility-administered medications for this visit.   No Known Allergies    Review of Systems:  Constitutional:  No  fever, no chills, No recent illness  Endocrine: No polyuria/polydipsia/polyphagia   Neurologic: No  weakness   Exam:  BP 112/75 mmHg  Pulse 65  Ht 6' (1.829 m)  Wt 227 lb  (102.967 kg)  BMI 30.78 kg/m2  Constitutional: VS see above. General Appearance: alert, well-developed, well-nourished, NAD  Respiratory: Normal respiratory effort.  Cardiovascular: No lower extremity edema.   Musculoskeletal: Gait normal.   Skin: warm, dry, intact.   Psychiatric: Normal judgment/insight. Normal mood and affect. Oriented x3.    Results for orders placed or performed in visit on 07/04/16 (from the past 72 hour(s))  POCT glycosylated hemoglobin (Hb A1C)     Status: None   Collection Time: 07/04/16  8:31 AM  Result Value Ref Range   Hemoglobin A1C 6.1       ASSESSMENT/PLAN:   DM2 well controlled. 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   Will be due in 6 mos for lipids, microalbumin, renal fxn etc and follow up for annual wellness / review labs and control of chronic problems.   Controlled type 2 diabetes mellitus without complication, without long-term current use of insulin ([place]) - Plan: POCT glycosylated hemoglobin (Hb A1C)    Visit summary with medication list and pertinent instructions was printed for patient to review. All questions at time of visit were answered - patient instructed to contact office with any additional concerns. ER/RTC precautions were reviewed with the patient. Follow-up plan: Return in about 6 months (around 01/04/2017) for [place] .  Note: Total time spent 15 minutes, greater than 50% of the visit was spent face-to-face counseling and coordinating care for the following: Controlled type 2 diabetes mellitus without complication, without long-term current use of insulin ([place]) was also pertinent to this visit.Marland Kitchen

## 2016-07-05 ENCOUNTER — Other Ambulatory Visit: Payer: Self-pay | Admitting: Osteopathic Medicine

## 2016-07-19 ENCOUNTER — Encounter: Payer: Self-pay | Admitting: Osteopathic Medicine

## 2016-07-20 ENCOUNTER — Ambulatory Visit (INDEPENDENT_AMBULATORY_CARE_PROVIDER_SITE_OTHER): Payer: Managed Care, Other (non HMO) | Admitting: Sports Medicine

## 2016-07-20 DIAGNOSIS — E669 Obesity, unspecified: Secondary | ICD-10-CM

## 2016-07-20 MED ORDER — PHENTERMINE HCL 37.5 MG PO TABS: ORAL_TABLET | ORAL | 0 refills | Status: DC

## 2016-07-20 NOTE — Assessment & Plan Note (Signed)
Weight has plateaued, refilling phentermine for 90 days, half pill. We will discontinue after that. Seems to be doing some obstructive uropathy type symptoms, this will improve as we taper down off the phentermine. He will discuss this in further detail with his PCP.

## 2016-07-20 NOTE — Progress Notes (Signed)
  Subjective:    CC: Follow-up  HPI: Obesity: Weight loss is stabilized, agreeable to down taper his phentermine. He did bring some peach moonshine today.  Past medical history, Surgical history, Family history not pertinant except as noted below, Social history, Allergies, and medications have been entered into the medical record, reviewed, and no changes needed.   Review of Systems: No fevers, chills, night sweats, weight loss, chest pain, or shortness of breath.   Objective:    General: Well Developed, well nourished, and in no acute distress.  Neuro: Alert and oriented x3, extra-ocular muscles intact, sensation grossly intact.  HEENT: Normocephalic, atraumatic, pupils equal round reactive to light, neck supple, no masses, no lymphadenopathy, thyroid nonpalpable.  Skin: Warm and dry, no rashes. Cardiac: Regular rate and rhythm, no murmurs rubs or gallops, no lower extremity edema.  Respiratory: Clear to auscultation bilaterally. Not using accessory muscles, speaking in full sentences.  Impression and Recommendations:    Obesity Weight has plateaued, refilling phentermine for 90 days, half pill. We will discontinue after that. Seems to be doing some obstructive uropathy type symptoms, this will improve as we taper down off the phentermine. He will discuss this in further detail with his PCP.

## 2016-07-21 ENCOUNTER — Other Ambulatory Visit: Payer: Self-pay | Admitting: Osteopathic Medicine

## 2016-07-21 DIAGNOSIS — Z794 Long term (current) use of insulin: Secondary | ICD-10-CM

## 2016-07-21 DIAGNOSIS — E119 Type 2 diabetes mellitus without complications: Secondary | ICD-10-CM

## 2016-07-21 MED ORDER — PIOGLITAZONE HCL 15 MG PO TABS: 15.0000 mg | ORAL_TABLET | Freq: Every day | ORAL | 3 refills | Status: DC

## 2016-07-21 MED ORDER — ROSUVASTATIN CALCIUM 40 MG PO TABS: 40.0000 mg | ORAL_TABLET | Freq: Every day | ORAL | 3 refills | Status: DC

## 2016-07-21 MED ORDER — EMPAGLIFLOZIN-LINAGLIPTIN 25-5 MG PO TABS: 1.0000 | ORAL_TABLET | Freq: Every day | ORAL | 3 refills | Status: DC

## 2016-07-21 MED ORDER — METFORMIN HCL 1000 MG PO TABS: 1000.0000 mg | ORAL_TABLET | Freq: Two times a day (BID) | ORAL | 0 refills | Status: DC

## 2016-07-25 ENCOUNTER — Other Ambulatory Visit: Payer: Self-pay | Admitting: Sports Medicine

## 2016-08-07 ENCOUNTER — Other Ambulatory Visit: Payer: Self-pay | Admitting: Sports Medicine

## 2016-09-04 ENCOUNTER — Other Ambulatory Visit: Payer: Self-pay | Admitting: Sports Medicine

## 2016-09-10 ENCOUNTER — Other Ambulatory Visit: Payer: Self-pay | Admitting: Osteopathic Medicine

## 2016-10-04 ENCOUNTER — Other Ambulatory Visit: Payer: Self-pay | Admitting: Sports Medicine

## 2016-10-05 ENCOUNTER — Other Ambulatory Visit (INDEPENDENT_AMBULATORY_CARE_PROVIDER_SITE_OTHER): Payer: Managed Care, Other (non HMO)

## 2016-10-05 DIAGNOSIS — D352 Benign neoplasm of pituitary gland: Secondary | ICD-10-CM | POA: Diagnosis not present

## 2016-10-05 LAB — T4, FREE: FREE T4: 0.71 ng/dL (ref 0.60–1.60)

## 2016-10-06 LAB — PROLACTIN: PROLACTIN: 39.9 ng/mL — AB (ref 4.0–15.2)

## 2016-10-09 ENCOUNTER — Ambulatory Visit: Payer: Self-pay | Admitting: Endocrinology

## 2016-10-12 ENCOUNTER — Encounter: Payer: Self-pay | Admitting: Endocrinology

## 2016-10-12 ENCOUNTER — Ambulatory Visit (INDEPENDENT_AMBULATORY_CARE_PROVIDER_SITE_OTHER): Payer: Managed Care, Other (non HMO) | Admitting: Endocrinology

## 2016-10-12 VITALS — BP 126/76 | HR 68 | Temp 98.0°F | Resp 16 | Ht 72.0 in | Wt 224.6 lb

## 2016-10-12 DIAGNOSIS — D352 Benign neoplasm of pituitary gland: Secondary | ICD-10-CM

## 2016-10-12 NOTE — Patient Instructions (Signed)
Same doses

## 2016-10-12 NOTE — Progress Notes (Signed)
Patient ID: William Webster, male   DOB: 05-Jul-1951, 65 y.o.   MRN: [id number]          Chief complaint: Follow-up of prolactinoma  History of Present Illness:   Prior history: He apparently had a blunt head injury in the year 1997 and a brain scan showed a pituitary  tumor.  He also apparently was having headaches prior to this.  Did not have any visual field problems. He says that his prolactin level was very high and his endocrinologist and neurosurgeon elected to treat him with Parlodel. Also for 4 months he was treated with testosterone patch even though he does not think he was having any sexual dysfunction No records are available from initial evaluation or follow-up  Since about 2015 he is taking Dostinex twice a week which he takes on Mondays and Fridays  RECENT history:  His MRI films from [facility]  showed an approximately 1.5 cm tumor New MRI from 05/2016 shows: Interval improvement in pituitary macro adenoma. In 1999 there was enhancing tumor within the left sella extending into the clivus and sphenoid sinus. Most of this has resolved. There remains enhancing tissue within the sella bilaterally which could be pituitary tissue possibly with treated adenoma. No definite mass lesion on the current study.  He has had a  higher prolactin in 2017 and his Dostinex was increased to 0.5 mg twice a week His prolactin is again relatively high at about 40, previously  31.6 Has been quite regular with taking his Dostinex twice a week  Although his total testosterone level is low his free testosterone is normal as of 05/2016 Does not complain of any unusual fatigue Free T4 is normal in 10/17    Lab Results  Component Value Date   PROLACTIN 39.9 (H) 10/05/2016   PROLACTIN 31.6 (H) 06/05/2016   PROLACTIN 38.5 (H) 03/03/2016   PROLACTIN 18.3 (H) 08/16/2015   No visits with results within 1 Week(s) from this visit.  Latest known visit with results is:  Lab on 10/05/2016    Component Date Value Ref Range Status  . Free T4 10/05/2016 0.71  0.60 - 1.60 ng/dL Final   Comment: Specimens from patients who are undergoing biotin therapy and /or ingesting biotin supplements may contain high levels of biotin.  The higher biotin concentration in these specimens interferes with this Free T4 assay.  Specimens that contain high levels  of biotin may cause false high results for this Free T4 assay.  Please interpret results in light of the total clinical presentation of the patient.    . Prolactin 10/06/2016 39.9* 4.0 - 15.2 ng/mL Final     Past Medical History:  Diagnosis Date  . Anterior pituitary adenoma syndrome ([place])   . Anxiety   . Bimalleolar fracture of right ankle   . Colon polyp   . Diabetes mellitus without complication ([place])   . Diabetic neuropathy ([place])   . Family history of cancer of GI tract   . Family history of colon cancer   . Gilbert disease   . Hyperlipidemia   . Low back pain   . Lumbar scoliosis   . Prolactinoma ([place])   . Sciatica of right side   . Spondylosis of lumbar joint     Past Surgical History:  Procedure Laterality Date  . HEMORRHOID SURGERY    . ORIF ANKLE FRACTURE Right 11/24/2015   Procedure: OPEN REDUCTION INTERNAL FIXATION (ORIF) ANKLE FRACTURE;  Surgeon: Dorna Leitz, MD;  Location: [facility];  Service: Orthopedics;  Laterality: Right;  Open reduction internal fixation right ankle fracture  . TONSILLECTOMY      Family History  Problem Relation Age of Onset  . Cancer Mother     colon  . Heart failure Mother   . Diabetes Mother   . Hyperlipidemia Mother   . Stroke Mother   . Stroke Father   . Hyperlipidemia Father   . Diabetes Maternal Grandfather   . Heart disease Maternal Grandfather   . Stroke Maternal Grandfather   . Diabetes Paternal Grandfather   . Heart disease Paternal Grandfather   . Stroke Paternal Grandfather     Social History:  reports that he has been smoking Cigars.  He has never  used smokeless tobacco. He reports that he drinks about 6.0 oz of alcohol per week . He reports that he does not use drugs.  Allergies: No Known Allergies    Medication List       Accurate as of 10/12/16  5:15 PM. Always use your most recent med list.          cabergoline 0.5 MG tablet Commonly known as:  DOSTINEX Take 0.5 tablets (0.25 mg total) by mouth 2 (two) times a week. Take 1 tablet twice a week   Empagliflozin-Linagliptin 25-5 MG Tabs Commonly known as:  GLYXAMBI Take 1 tablet by mouth daily.   glucose blood test strip Check blood sugar twice daily. Diagnosis DM ICD 10 - E11.9   meloxicam 15 MG tablet Commonly known as:  MOBIC TAKE 1 TABLET BY MOUTH EVERY MORINING WITH BREAKFAST FOR 2 WEEKS, THEN DAILY AS NEEDED FOR PAIN   metFORMIN 1000 MG tablet Commonly known as:  GLUCOPHAGE Take 1 tablet (1,000 mg total) by mouth 2 (two) times daily with a meal. APPOINTMENT NEEDED FOR FURTHER REFILLS   phentermine 37.5 MG tablet Commonly known as:  ADIPEX-P One half tab daily   pioglitazone 15 MG tablet Commonly known as:  ACTOS Take 1 tablet (15 mg total) by mouth daily.   rosuvastatin 40 MG tablet Commonly known as:  CRESTOR Take 1 tablet (40 mg total) by mouth daily.   topiramate 200 MG tablet Commonly known as:  TOPAMAX TAKE 1 TABLET BY MOUTH EVERY DAY        REVIEW OF SYSTEMS:        DIABETES: He thinks this was diagnosed in about the year 2002 Treated with combination of Jardiance and Tradjenta  in addition to Actos and metformin with better control in 2017 This is being followed by his PCP  With taking phentermine and Topamax from his PCP and being more active he had lost weight  His glucose readings are still overall relatively high with median 144, higher in the morning and 156  Lab Results  Component Value Date   HGBA1C 6.1 07/04/2016   HGBA1C 6.5 (H) 05/18/2016   HGBA1C 6.5 01/12/2016   Lab Results  Component Value Date   MICROALBUR 2.0  08/16/2015   LDLCALC 91 01/12/2016   CREATININE 1.03 06/05/2016   OBESITY: He has had phentermine and topiramate prescribed by another physician Since early 2016 He is not completely stopping this as yet   Wt Readings from Last 3 Encounters:  10/12/16 224 lb 9.6 oz (101.9 kg)  07/20/16 226 lb 4.8 oz (102.6 kg)  07/04/16 227 lb (103 kg)       PHYSICAL EXAM:  BP 126/76   Pulse 68   Temp 98 F (36.7 C)  Resp 16   Ht 6' (1.829 m)   Wt 224 lb 9.6 oz (101.9 kg)   SpO2 97%   BMI 30.46 kg/m    ASSESSMENT:   PROLACTINOMA, Long-standing He has had a macroprolactinoma treated with either bromocriptine or cabergoline since 1997  This apparently has resolved now recent MRI showing no residual edema   He symptomatically is doing fairly well and does not complain of any symptoms suggestive of testosterone deficiency This is despite his prolactin level being mildly increased at 40 which may represent microscopic active cells  DIABETES and obesity: He says being managed by PCP exclusively now  PLAN:   He will continue Dostinex 0.5 mg 1 week Recheck testosterone on the next visit Continue follow-up with PCP for diabetes and weight maintenance  Ketty Bitton 10/12/2016, 5:15 PM

## 2016-10-27 ENCOUNTER — Other Ambulatory Visit: Payer: Self-pay

## 2016-10-27 MED ORDER — GLUCOSE BLOOD VI STRP: ORAL_STRIP | 99 refills | Status: DC

## 2016-11-01 ENCOUNTER — Other Ambulatory Visit: Payer: Self-pay | Admitting: Sports Medicine

## 2016-11-08 ENCOUNTER — Telehealth: Payer: Self-pay

## 2016-11-08 MED ORDER — METFORMIN HCL 1000 MG PO TABS: 1000.0000 mg | ORAL_TABLET | Freq: Two times a day (BID) | ORAL | 0 refills | Status: DC

## 2016-11-08 NOTE — Telephone Encounter (Signed)
Patient request a refill for Metformin. A 90b day supply has been sent to pharmacy. Patient do have an upcoming appt  Scheduled for January 11th according to last OV note. [name] Visit summary with medication list and pertinent instructions was printed for patient to review. All questions at time of visit were answered - patient instructed to contact office with any additional concerns. ER/RTC precautions were reviewed with the patient. Follow-up plan: Return in about 6 months (around 01/04/2017) for [place] .

## 2016-11-29 ENCOUNTER — Other Ambulatory Visit: Payer: Self-pay | Admitting: Sports Medicine

## 2016-11-29 DIAGNOSIS — E119 Type 2 diabetes mellitus without complications: Secondary | ICD-10-CM

## 2016-12-31 ENCOUNTER — Other Ambulatory Visit: Payer: Self-pay | Admitting: Sports Medicine

## 2017-01-04 ENCOUNTER — Other Ambulatory Visit: Payer: Self-pay | Admitting: Osteopathic Medicine

## 2017-01-04 ENCOUNTER — Encounter: Payer: Self-pay | Admitting: Osteopathic Medicine

## 2017-01-04 ENCOUNTER — Ambulatory Visit (INDEPENDENT_AMBULATORY_CARE_PROVIDER_SITE_OTHER): Payer: BLUE CROSS/BLUE SHIELD | Admitting: Osteopathic Medicine

## 2017-01-04 VITALS — BP 118/57 | HR 51 | Ht 72.0 in | Wt 233.0 lb

## 2017-01-04 DIAGNOSIS — E119 Type 2 diabetes mellitus without complications: Secondary | ICD-10-CM | POA: Diagnosis not present

## 2017-01-04 DIAGNOSIS — Z0000 Encounter for general adult medical examination without abnormal findings: Secondary | ICD-10-CM

## 2017-01-04 DIAGNOSIS — D352 Benign neoplasm of pituitary gland: Secondary | ICD-10-CM

## 2017-01-04 LAB — CBC WITH DIFFERENTIAL/PLATELET
Basophils Absolute: 0 cells/uL (ref 0–200)
Basophils Relative: 0 %
EOS PCT: 2 %
Eosinophils Absolute: 116 cells/uL (ref 15–500)
HEMATOCRIT: 43.4 % (ref 38.5–50.0)
HEMOGLOBIN: 14.2 g/dL (ref 13.2–17.1)
LYMPHS ABS: 1508 {cells}/uL (ref 850–3900)
Lymphocytes Relative: 26 %
MCH: 28.3 pg (ref 27.0–33.0)
MCHC: 32.7 g/dL (ref 32.0–36.0)
MCV: 86.6 fL (ref 80.0–100.0)
MONO ABS: 522 {cells}/uL (ref 200–950)
MPV: 9.8 fL (ref 7.5–12.5)
Monocytes Relative: 9 %
NEUTROS ABS: 3654 {cells}/uL (ref 1500–7800)
Neutrophils Relative %: 63 %
Platelets: 99 10*3/uL — ABNORMAL LOW (ref 140–400)
RBC: 5.01 MIL/uL (ref 4.20–5.80)
RDW: 14.2 % (ref 11.0–15.0)
WBC: 5.8 10*3/uL (ref 3.8–10.8)

## 2017-01-04 LAB — LIPID PANEL
CHOL/HDL RATIO: 1.9 ratio (ref <5.0)
Cholesterol: 126 mg/dL (ref <200)
HDL: 68 mg/dL (ref >40)
LDL Cholesterol: 43 mg/dL (ref <100)
Triglycerides: 74 mg/dL (ref <150)
VLDL: 15 mg/dL (ref <30)

## 2017-01-04 LAB — COMPLETE METABOLIC PANEL WITH GFR
ALBUMIN: 4.3 g/dL (ref 3.6–5.1)
ALK PHOS: 37 U/L — AB (ref 40–115)
ALT: 17 U/L (ref 9–46)
AST: 19 U/L (ref 10–35)
BILIRUBIN TOTAL: 2 mg/dL — AB (ref 0.2–1.2)
BUN: 25 mg/dL (ref 7–25)
CO2: 26 mmol/L (ref 20–31)
CREATININE: 0.97 mg/dL (ref 0.70–1.25)
Calcium: 9.2 mg/dL (ref 8.6–10.3)
Chloride: 106 mmol/L (ref 98–110)
GFR, Est African American: >89 mL/min (ref >=60)
GFR, Est Non African American: 83 mL/min (ref >=60)
GLUCOSE: 125 mg/dL — AB (ref 65–99)
Potassium: 4.7 mmol/L (ref 3.5–5.3)
SODIUM: 140 mmol/L (ref 135–146)
TOTAL PROTEIN: 6.8 g/dL (ref 6.1–8.1)

## 2017-01-04 LAB — POCT UA - MICROALBUMIN

## 2017-01-04 LAB — POCT GLYCOSYLATED HEMOGLOBIN (HGB A1C): HEMOGLOBIN A1C: 6.4

## 2017-01-04 LAB — TSH: TSH: 1.37 m[IU]/L (ref 0.40–4.50)

## 2017-01-04 MED ORDER — ROSUVASTATIN CALCIUM 40 MG PO TABS: 40.0000 mg | ORAL_TABLET | Freq: Every day | ORAL | 3 refills | Status: DC

## 2017-01-04 MED ORDER — METFORMIN HCL 1000 MG PO TABS: 1000.0000 mg | ORAL_TABLET | Freq: Two times a day (BID) | ORAL | 3 refills | Status: DC

## 2017-01-04 NOTE — Progress Notes (Signed)
HPI: William Webster is a 66 y.o. male  who presents to [facility] today, 01/04/17,  for chief complaint of:  Chief Complaint  Patient presents with  . Annual Exam    Patient here for annual physical exam, no complaints today. See below for review of preventive care  Diabetes: Compliant with diet/exercise changes though admits he could've been better over the holiday season and has experienced a bit of weight gain. In well on current medications. No home glucose to report.  Pituitary adenoma: Following with endocrine  Past medical, surgical, social and family history reviewed: Patient Active Problem List   Diagnosis Date Noted  . Screening for colon cancer 12/10/2015  . Fracture of ankle, bimalleolar, right, closed 11/22/2015  . Pituitary adenoma ([place]) 08/17/2015  . Hyperprolactinemia ([place]) 08/17/2015  . Obesity 07/15/2015  . Spondylosis of lumbar region without myelopathy or radiculopathy 07/15/2015  . Primary osteoarthritis of both knees 07/15/2015  . Right cervical radiculopathy 01/07/2015  . HLD (hyperlipidemia) 01/05/2014  . Diabetes mellitus type 2, controlled, without complications ([place]) 123456   Past Surgical History:  Procedure Laterality Date  . HEMORRHOID SURGERY    . ORIF ANKLE FRACTURE Right 11/24/2015   Procedure: OPEN REDUCTION INTERNAL FIXATION (ORIF) ANKLE FRACTURE;  Surgeon: Dorna Leitz, MD;  Location: [facility];  Service: Orthopedics;  Laterality: Right;  Open reduction internal fixation right ankle fracture  . TONSILLECTOMY     Social History  Substance Use Topics  . Smoking status: Light Tobacco Smoker    Types: Cigars  . Smokeless tobacco: Never Used     Comment: occassional cigar  . Alcohol use 6.0 oz/week    10 Standard drinks or equivalent per week     Comment: social 12-20 drinks a week   Family History  Problem Relation Age of Onset  . Cancer Mother     colon  . Heart failure  Mother   . Diabetes Mother   . Hyperlipidemia Mother   . Stroke Mother   . Stroke Father   . Hyperlipidemia Father   . Diabetes Maternal Grandfather   . Heart disease Maternal Grandfather   . Stroke Maternal Grandfather   . Diabetes Paternal Grandfather   . Heart disease Paternal Grandfather   . Stroke Paternal Grandfather      Current medication list and allergy/intolerance information reviewed:   Current Outpatient Prescriptions on File Prior to Visit  Medication Sig Dispense Refill  . cabergoline (DOSTINEX) 0.5 MG tablet Take 0.5 tablets (0.25 mg total) by mouth 2 (two) times a week. Take 1 tablet twice a week 30 tablet 3  . glucose blood test strip Check blood sugar twice daily. Diagnosis DM ICD 10 - E11.9 200 each prn  . GLYXAMBI 25-5 MG TABS TAKE 1 TABLET BY MOUTH DAILY 30 tablet 8  . meloxicam (MOBIC) 15 MG tablet TAKE 1 TABLET BY MOUTH EVERY MORINING WITH BREAKFAST FOR 2 WEEKS, THEN DAILY AS NEEDED FOR PAIN 30 tablet 2  . metFORMIN (GLUCOPHAGE) 1000 MG tablet Take 1 tablet (1,000 mg total) by mouth 2 (two) times daily with a meal. APPOINTMENT NEEDED FOR FURTHER REFILLS 180 tablet 0  . pioglitazone (ACTOS) 15 MG tablet Take 1 tablet (15 mg total) by mouth daily. 90 tablet 3  . rosuvastatin (CRESTOR) 40 MG tablet Take 1 tablet (40 mg total) by mouth daily. 90 tablet 3   No current facility-administered medications on file prior to visit.    No Known Allergies  Review of Systems:  Constitutional: No recent illness  HEENT: No  headache, no vision change  Cardiac: No  chest pain, No  pressure, No palpitations  Respiratory:  No  shortness of breath. No  Cough  Gastrointestinal: No  abdominal pain, no change on bowel habits  Musculoskeletal: No new myalgia/arthralgia  Skin: No  Rash  Hem/Onc: No  easy bruising/bleeding, No  abnormal lumps/bumps  Neurologic: No  weakness, No  Dizziness  Psychiatric: No  concerns with depression, No  concerns with anxiety  Exam:   BP (!) 118/57   Pulse (!) 51   Wt 233 lb (105.7 kg)   BMI 31.60 kg/m    Constitutional: VS see above. General Appearance: alert, well-developed, well-nourished, NAD  Eyes: Normal lids and conjunctive, non-icteric sclera  Ears, Nose, Mouth, Throat: MMM, Normal external inspection ears/nares/mouth/lips/gums.Tympanic memories normal bilaterally  Neck: No masses, trachea midline. Normal pharynx/tonsils. No thyroid enlargement/mass, no lymphadenopathy  Respiratory: Normal respiratory effort. no wheeze, no rhonchi, no rales  Cardiovascular: S1/S2 normal, no murmur, no rub/gallop auscultated. RRR. No lower extremity edema  Gastrointestinal: Nontender, nondistended, bowel sounds normal 4, rectal exam deferred  Musculoskeletal: Gait normal. Symmetric and independent movement of all extremities  Neurological: Normal balance/coordination. No tremor.  Skin: warm, dry, intact.   Psychiatric: Normal judgment/insight. Normal mood and affect. Oriented x3.    Results for orders placed or performed in visit on 01/04/17 (from the past 72 hour(s))  POCT UA - Microalbumin     Status: Abnormal   Collection Time: 01/04/17 10:09 AM  Result Value Ref Range   Microalbumin Ur, POC 30mg /l mg/L   Creatinine, POC 200mg /dl mg/dL   Albumin/Creatinine Ratio, Urine, POC 30mg /g   POCT HgB A1C     Status: Abnormal   Collection Time: 01/04/17 10:11 AM  Result Value Ref Range   Hemoglobin A1C 6.4      ASSESSMENT/PLAN:   Annual physical exam - Plan: CBC with Differential/Platelet, COMPLETE METABOLIC PANEL WITH GFR, Lipid panel, TSH, PSA  Controlled type 2 diabetes mellitus without complication, without long-term current use of insulin ([facility]) - Plan: COMPLETE METABOLIC PANEL WITH GFR, Lipid panel, metFORMIN (GLUCOPHAGE) 1000 MG tablet, rosuvastatin (CRESTOR) 40 MG tablet, POCT HgB A1C, POCT UA - Microalbumin  Pituitary adenoma ([place])   MALE PREVENTIVE CARE  updated 01/04/17  ANNUAL  SCREENING/COUNSELING  Any changes to health in the past year? no  Diet/Exercise - HEALTHY HABITS DISCUSSED TO DECREASE CV RISK History  Smoking Status  . Light Tobacco Smoker  . Types: Cigars  Smokeless Tobacco  . Never Used    Comment: occassional cigar   History  Alcohol Use  . 6.0 oz/week  . 10 Standard drinks or equivalent per week    Comment: social 12-20 drinks a week   No flowsheet data found.  [place]  Sexually active in the past year? - Yes with male.  STI testing needed/desired today? - no  Any concerns with testosterone/libido? - no  INFECTIOUS DISEASE SCREENING  HIV - does not need  GC/CT - does not need  HepC - does not need  TB - does not need  CANCER SCREENING  Lung - USPSTF: 55-80yo w/ 30 py hx unless quit w/in 49yr - does not need  Colon - does not need  Prostate - needs  OTHER DISEASE SCREENING  Lipid - needs  DM2 - does not need  AAA - 65-75yo ever smoked: does not need  Osteoporosis - men 64yo+ - does not need  ADULT  VACCINATION  Influenza - annual vaccine recommended  Td - booster every 10 years   Zoster - option at 2, yes at 60+   PCV13 - was not indicated  PPSV23 - already has Immunization History  Administered Date(s) Administered  . Influenza,inj,Quad PF,36+ Mos 08/23/2015  . Influenza-Unspecified 09/07/2016  . Pneumococcal Polysaccharide-23 01/12/2016  . Tdap 03/17/2013  . Zoster 08/23/2015    OTHER  Fall - exercise and Vit D age 30+ - does not need  Consider ASA - age 21-59 - does not need     Visit summary with medication list and pertinent instructions was printed for patient to review. All questions at time of visit were answered - patient instructed to contact office with any additional concerns. ER/RTC precautions were reviewed with the patient. Follow-up plan: Return in about 6 months (around 07/04/2017) for diabetes follow-up, sooner if needed.

## 2017-01-05 LAB — PSA: PSA: 1.4 ng/mL

## 2017-01-05 NOTE — Addendum Note (Signed)
Addended by: Maryla Morrow on: 01/05/2017 01:48 PM   Modules accepted: Orders

## 2017-01-25 ENCOUNTER — Ambulatory Visit (INDEPENDENT_AMBULATORY_CARE_PROVIDER_SITE_OTHER): Payer: BLUE CROSS/BLUE SHIELD | Admitting: Osteopathic Medicine

## 2017-01-25 ENCOUNTER — Other Ambulatory Visit: Payer: Self-pay

## 2017-01-25 ENCOUNTER — Encounter: Payer: Self-pay | Admitting: Osteopathic Medicine

## 2017-01-25 VITALS — BP 122/70 | HR 67 | Temp 98.4°F | Ht 72.0 in | Wt 233.0 lb

## 2017-01-25 DIAGNOSIS — E221 Hyperprolactinemia: Secondary | ICD-10-CM

## 2017-01-25 DIAGNOSIS — B9789 Other viral agents as the cause of diseases classified elsewhere: Secondary | ICD-10-CM

## 2017-01-25 DIAGNOSIS — J069 Acute upper respiratory infection, unspecified: Secondary | ICD-10-CM | POA: Diagnosis not present

## 2017-01-25 MED ORDER — CABERGOLINE 0.5 MG PO TABS: 0.2500 mg | ORAL_TABLET | ORAL | 3 refills | Status: DC

## 2017-01-25 MED ORDER — IPRATROPIUM BROMIDE 0.03 % NA SOLN
1.0000 | Freq: Four times a day (QID) | NASAL | 0 refills | Status: DC
Start: 2017-01-25 — End: 2017-07-05

## 2017-01-25 MED ORDER — AZITHROMYCIN 250 MG PO TABS: ORAL_TABLET | ORAL | 0 refills | Status: DC

## 2017-01-25 MED ORDER — BENZONATATE 200 MG PO CAPS: 200.0000 mg | ORAL_CAPSULE | Freq: Three times a day (TID) | ORAL | 0 refills | Status: DC | PRN

## 2017-01-25 MED ORDER — GUAIFENESIN-CODEINE 100-10 MG/5ML PO SYRP: 5.0000 mL | ORAL_SOLUTION | Freq: Four times a day (QID) | ORAL | 0 refills | Status: DC | PRN

## 2017-01-25 NOTE — Telephone Encounter (Signed)
Patient called requested refill for Cabergoline 0.5 mg. Patient stated that you all talked about this during his visit today but it was never sent to pharmacy. patient wants this to go to [facility]. Please advise if refill is appropriate. Rhonda Cunningham,CMA

## 2017-01-25 NOTE — Progress Notes (Signed)
HPI: William Webster is a 66 y.o. male who presents to [facility] 01/25/17 for chief complaint of:  Chief Complaint  Patient presents with  . Cough    Acute Illness: . Context: worried about going into his chest and having bronchitis . Location: sinuses and chest  . Quality: started as cough, then sore throat, then nasal symptoms, significant runny nose. Trouble sleeping at night.  . Assoc signs/symptoms: see ROS . Duration: 4-5 days . Modifying factors: has tried the following OTC/Rx medications: OTC stuff not helping,    Past medical, social and family history reviewed. Current medications and allergies reviewed.     Review of Systems:  Constitutional: No  fever/chills  HEENT: Yes  headache, Yes  sore throat, No  swollen glands  Cardiovascular: No chest pain  Respiratory:Yes  cough, No  shortness of breath  Gastrointestinal: No  nausea, No  vomiting,  No  diarrhea  Musculoskeletal:   No  myalgia/arthralgia  Skin/Integument:  No  rash   Exam:  BP 122/70   Pulse 67   Temp 98.4 F (36.9 C) (Oral)   Ht 6' (1.829 m)   Wt 233 lb (105.7 kg)   BMI 31.60 kg/m   Constitutional: VSS, see above. General Appearance: alert, well-developed, well-nourished, NAD  Eyes: Normal lids and conjunctive, non-icteric sclera, PERRLA  Ears, Nose, Mouth, Throat: Normal external inspection ears/nares/mouth/lips/gums, fluid behind TM on L is clear, canals normal TM, MMM; posterior pharynx with erythema, without exudate, nasal mucosa normal  Neck: No masses, trachea midline. normal lymph nodes  Respiratory: Normal respiratory effort. No  wheeze/rhonchi/rales  Cardiovascular: S1/S2 normal, no murmur/rub/gallop auscultated. RRR.    ASSESSMENT/PLAN: Fill abx if persist/worsen, otherwise symptomatic care for URI/bronchitis  Viral URI with cough - Plan: ipratropium (ATROVENT) 0.03 % nasal spray, guaiFENesin-codeine (ROBITUSSIN AC) 100-10 MG/5ML  syrup, benzonatate (TESSALON) 200 MG capsule, azithromycin (ZITHROMAX) 250 MG tablet     Visit summary was printed for the patient with medications and pertinent instructions for patient to review. ER/RTC precautions reviewed. All questions answered. Return if symptoms worsen or fail to improve.

## 2017-01-25 NOTE — Patient Instructions (Signed)

## 2017-01-30 ENCOUNTER — Telehealth: Payer: Self-pay

## 2017-01-30 DIAGNOSIS — E221 Hyperprolactinemia: Secondary | ICD-10-CM

## 2017-01-30 MED ORDER — CABERGOLINE 0.5 MG PO TABS: ORAL_TABLET | ORAL | 3 refills | Status: DC

## 2017-01-30 NOTE — Telephone Encounter (Signed)
Patient request refills for Cabergoline 0.5 mg #90 3 refills sent to pharmacy. Rhonda Cunningham,CMA

## 2017-02-01 ENCOUNTER — Other Ambulatory Visit: Payer: Self-pay | Admitting: Osteopathic Medicine

## 2017-02-01 DIAGNOSIS — E221 Hyperprolactinemia: Secondary | ICD-10-CM

## 2017-02-01 NOTE — Telephone Encounter (Signed)
Pharmacy needed clarification on cabergoline Last visit with Dr Chryl Heck [facility]) 10/12/2016:  "cabergoline 0.5 MG tablet Commonly known as:  DOSTINEX Take 0.5 tablets (0.25 mg total) by mouth 2 (two) times a week. Take 1 tablet twice a week"  A/P states "He will continue Dostinex 0.5 mg 1 week" so I imagine the instructions would be the 0.25 mg twice per week, but please clarify dosing instructions and send refill to [facility]. Thanks.

## 2017-02-02 MED ORDER — CABERGOLINE 0.5 MG PO TABS: 0.5000 mg | ORAL_TABLET | ORAL | 1 refills | Status: DC

## 2017-03-29 ENCOUNTER — Encounter: Payer: Self-pay | Admitting: Osteopathic Medicine

## 2017-03-29 DIAGNOSIS — Z794 Long term (current) use of insulin: Principal | ICD-10-CM

## 2017-03-29 DIAGNOSIS — E119 Type 2 diabetes mellitus without complications: Secondary | ICD-10-CM

## 2017-03-29 MED ORDER — PIOGLITAZONE HCL 15 MG PO TABS: 15.0000 mg | ORAL_TABLET | Freq: Every day | ORAL | 3 refills | Status: DC

## 2017-04-01 ENCOUNTER — Other Ambulatory Visit: Payer: Self-pay | Admitting: Sports Medicine

## 2017-04-09 ENCOUNTER — Other Ambulatory Visit: Payer: Self-pay

## 2017-04-12 ENCOUNTER — Ambulatory Visit: Payer: Self-pay | Admitting: Endocrinology

## 2017-04-18 ENCOUNTER — Other Ambulatory Visit: Payer: Self-pay | Admitting: Osteopathic Medicine

## 2017-05-02 LAB — HM DIABETES EYE EXAM

## 2017-05-08 ENCOUNTER — Encounter: Payer: Self-pay | Admitting: Osteopathic Medicine

## 2017-05-31 ENCOUNTER — Ambulatory Visit (INDEPENDENT_AMBULATORY_CARE_PROVIDER_SITE_OTHER): Payer: BLUE CROSS/BLUE SHIELD | Admitting: Sports Medicine

## 2017-05-31 DIAGNOSIS — M47816 Spondylosis without myelopathy or radiculopathy, lumbar region: Secondary | ICD-10-CM

## 2017-05-31 MED ORDER — DICLOFENAC SODIUM 75 MG PO TBEC: 75.0000 mg | DELAYED_RELEASE_TABLET | Freq: Two times a day (BID) | ORAL | 3 refills | Status: DC

## 2017-05-31 MED ORDER — PREDNISONE 50 MG PO TABS: ORAL_TABLET | ORAL | 0 refills | Status: DC

## 2017-05-31 NOTE — Progress Notes (Signed)
  Subjective:    CC: Low back pain  HPI: This is a pleasant 66 year old male, he has known lumbar degenerative disc disease, 2 years ago we treated him conservatively with physical therapy, bursts of steroids and he responded well. He is now having recurrence of pain in the right side of his low back, axial and discogenic without radiculopathy, no bowel or bladder dysfunction, saddle numbness, constitutional symptoms, trauma.  Past medical history:  Negative.  See flowsheet/record as well for more information.  Surgical history: Negative.  See flowsheet/record as well for more information.  Family history: Negative.  See flowsheet/record as well for more information.  Social history: Negative.  See flowsheet/record as well for more information.  Allergies, and medications have been entered into the medical record, reviewed, and no changes needed.   Review of Systems: No fevers, chills, night sweats, weight loss, chest pain, or shortness of breath.   Objective:    General: Well Developed, well nourished, and in no acute distress.  Neuro: Alert and oriented x3, extra-ocular muscles intact, sensation grossly intact.  HEENT: Normocephalic, atraumatic, pupils equal round reactive to light, neck supple, no masses, no lymphadenopathy, thyroid nonpalpable.  Skin: Warm and dry, no rashes. Cardiac: Regular rate and rhythm, no murmurs rubs or gallops, no lower extremity edema.  Respiratory: Clear to auscultation bilaterally. Not using accessory muscles, speaking in full sentences. Back Exam:  Inspection: Unremarkable  Motion: Flexion 45 deg, Extension 45 deg, Side Bending to 45 deg bilaterally,  Rotation to 45 deg bilaterally  SLR laying: Negative  XSLR laying: Negative  Palpable tenderness: None. FABER: negative. Sensory change: Gross sensation intact to all lumbar and sacral dermatomes.  Reflexes: 2+ at both patellar tendons, 2+ at achilles tendons, Babinski's downgoing.  Strength at foot    Plantar-flexion: 5/5 Dorsi-flexion: 5/5 Eversion: 5/5 Inversion: 5/5  Leg strength  Quad: 5/5 Hamstring: 5/5 Hip flexor: 5/5 Hip abductors: 5/5  Gait unremarkable.  Impression and Recommendations:    Spondylosis of lumbar region without myelopathy or radiculopathy 5 days of prednisone, home physical therapy.  Switching to diclofenac. Return if no better in one month. No red flags, x-rays in the past of showed multilevel degenerative changes in the disks and facet joints.  I spent 25 minutes with this patient, greater than 50% was face-to-face time counseling regarding the above diagnoses

## 2017-05-31 NOTE — Assessment & Plan Note (Signed)
5 days of prednisone, home physical therapy.  Switching to diclofenac. Return if no better in one month. No red flags, x-rays in the past of showed multilevel degenerative changes in the disks and facet joints.

## 2017-06-17 ENCOUNTER — Encounter: Payer: Self-pay | Admitting: Emergency Medicine

## 2017-06-17 ENCOUNTER — Emergency Department
Admission: EM | Admit: 2017-06-17 | Discharge: 2017-06-17 | Disposition: A | Discharge status: Home / Self Care | Payer: BLUE CROSS/BLUE SHIELD | Source: Home / Self Care | Attending: Family Medicine | Admitting: Family Medicine

## 2017-06-17 DIAGNOSIS — R1032 Left lower quadrant pain: Secondary | ICD-10-CM | POA: Diagnosis not present

## 2017-06-17 LAB — POCT URINALYSIS DIP (MANUAL ENTRY)
Bilirubin, UA: NEGATIVE
Ketones, POC UA: NEGATIVE mg/dL
LEUKOCYTES UA: NEGATIVE
NITRITE UA: NEGATIVE
Protein Ur, POC: NEGATIVE mg/dL
Spec Grav, UA: 1.01 (ref 1.010–1.025)
Urobilinogen, UA: 0.2 E.U./dL
pH, UA: 5 (ref 5.0–8.0)

## 2017-06-17 LAB — POCT CBC W AUTO DIFF (K'VILLE URGENT CARE)

## 2017-06-17 MED ORDER — HYDROCODONE-ACETAMINOPHEN 5-325 MG PO TABS: 1.0000 | ORAL_TABLET | Freq: Four times a day (QID) | ORAL | 0 refills | Status: DC | PRN

## 2017-06-17 NOTE — Discharge Instructions (Signed)
Begin clear liquids for about 24 hours, then may begin a [employer] (Bananas, Rice, Applesauce, Toast) when improved. Then gradually advance to a regular diet as tolerated.      If symptoms become significantly worse during the night or over the weekend, proceed to the local emergency room.

## 2017-06-17 NOTE — ED Triage Notes (Signed)
Pt c/o left sided lower abdominal pain that radiates around to his back.  The pain wakes him up at night, having normal BM's, some nausea, no problems voiding.  Pt does have a history of kidney stones.

## 2017-06-17 NOTE — ED Provider Notes (Signed)
[facility]    CSN: [id number] Arrival date & time: 06/17/17  1454     History   Chief Complaint Chief Complaint  Patient presents with  . Abdominal Pain    HPI William Webster is a 66 y.o. male.   About 5 days ago patient began to experience a "nagging" vague discomfort in his left lower quadrant radiating to his left back, associated with mild nausea (no vomiting).  During the early morning hours of the past three days he would awaken about 3am with left abdominal bloating and and urge to have a bowel movement.  Upon awakening the next morning he would have a normal bowel movement.  He has otherwise had no abnormal changes in his bowel movements recently; no hematochezia or melena.  He has felt hot during the past 4 days, but no fever.  He has noticed an increase in urinary frequency during the past 5 days without dysuria or urgency. He states that a back x-ray had incidentally shown kidney stones, but he does not recall ever passing a stone. He has a history of colonic polyps, with a negative repeat colonoscopy 6 years ago.  He notes that his colonoscopies have never shown diverticulosis.   The history is provided by the patient and the spouse.    Past Medical History:  Diagnosis Date  . Anterior pituitary adenoma syndrome ([place])   . Anxiety   . Bimalleolar fracture of right ankle   . Colon polyp   . Diabetes mellitus without complication ([place])   . Diabetic neuropathy ([place])   . Family history of cancer of GI tract   . Family history of colon cancer   . Gilbert disease   . Hyperlipidemia   . Low back pain   . Lumbar scoliosis   . Prolactinoma ([place])   . Sciatica of right side   . Spondylosis of lumbar joint     Patient Active Problem List   Diagnosis Date Noted  . Screening for colon cancer 12/10/2015  . Fracture of ankle, bimalleolar, right, closed 11/22/2015  . Pituitary adenoma ([place]) 08/17/2015  . Hyperprolactinemia ([place]) 08/17/2015  . Obesity  07/15/2015  . Spondylosis of lumbar region without myelopathy or radiculopathy 07/15/2015  . Primary osteoarthritis of both knees 07/15/2015  . Right cervical radiculopathy 01/07/2015  . HLD (hyperlipidemia) 01/05/2014  . Diabetes mellitus type 2, controlled, without complications ([place]) 67/89/3810    Past Surgical History:  Procedure Laterality Date  . HEMORRHOID SURGERY    . ORIF ANKLE FRACTURE Right 11/24/2015   Procedure: OPEN REDUCTION INTERNAL FIXATION (ORIF) ANKLE FRACTURE;  Surgeon: Dorna Leitz, MD;  Location: [facility];  Service: Orthopedics;  Laterality: Right;  Open reduction internal fixation right ankle fracture  . TONSILLECTOMY         Home Medications    Prior to Admission medications   Medication Sig Start Date End Date Taking? Authorizing Provider  cabergoline (DOSTINEX) 0.5 MG tablet Take 1 tablet (0.5 mg total) by mouth 2 (two) times a week. 02/05/17   Elayne Snare, MD  diclofenac (VOLTAREN) 75 MG EC tablet Take 1 tablet (75 mg total) by mouth 2 (two) times daily. 05/31/17 05/31/18  Silverio Decamp, MD  glucose blood test strip Check blood sugar twice daily. Diagnosis DM ICD 10 - E11.9 10/27/16   Emeterio Reeve, DO  GLYXAMBI 25-5 MG TABS TAKE 1 TABLET BY MOUTH DAILY 11/29/16   Emeterio Reeve, DO  HYDROcodone-acetaminophen (NORCO/VICODIN) 5-325 MG tablet Take 1 tablet by  mouth every 6 (six) hours as needed for moderate pain. 06/17/17   Kandra Nicolas, MD  ipratropium (ATROVENT) 0.03 % nasal spray Place 1-2 sprays into both nostrils 4 (four) times daily. 01/25/17   Emeterio Reeve, DO  meloxicam (MOBIC) 15 MG tablet TAKE 1 TABLET BY MOUTH EVERY MORINING WITH BREAKFAST FOR 2 WEEKS, THEN DAILY AS NEEDED FOR PAIN 04/01/17   Silverio Decamp, MD  metFORMIN (GLUCOPHAGE) 1000 MG tablet Take 1 tablet (1,000 mg total) by mouth 2 (two) times daily with a meal. 01/04/17   Emeterio Reeve, DO  ONE TOUCH ULTRA TEST test strip CHECK BLOOD SUGAR TWICE  DAILY. DIAGNOSIS DM ICD 10 - E11.9 04/18/17   Emeterio Reeve, DO  pioglitazone (ACTOS) 15 MG tablet Take 1 tablet (15 mg total) by mouth daily. 03/29/17   Emeterio Reeve, DO  predniSONE (DELTASONE) 50 MG tablet One tab PO daily for 5 days. 05/31/17   Silverio Decamp, MD  rosuvastatin (CRESTOR) 40 MG tablet Take 1 tablet (40 mg total) by mouth daily. 01/04/17   Emeterio Reeve, DO    Family History Family History  Problem Relation Age of Onset  . Cancer Mother        colon  . Heart failure Mother   . Diabetes Mother   . Hyperlipidemia Mother   . Stroke Mother   . Stroke Father   . Hyperlipidemia Father   . Diabetes Maternal Grandfather   . Heart disease Maternal Grandfather   . Stroke Maternal Grandfather   . Diabetes Paternal Grandfather   . Heart disease Paternal Grandfather   . Stroke Paternal Grandfather     Social History Social History  Substance Use Topics  . Smoking status: Light Tobacco Smoker    Types: Cigars  . Smokeless tobacco: Never Used     Comment: occassional cigar  . Alcohol use 6.0 oz/week    10 Standard drinks or equivalent per week     Comment: social 12-20 drinks a week     Allergies   Patient has no known allergies.   Review of Systems Review of Systems  Constitutional: Positive for activity change, appetite change and fatigue. Negative for chills, diaphoresis, fever and unexpected weight change.  HENT: Negative.   Eyes: Negative.   Respiratory: Negative.   Cardiovascular: Negative.   Gastrointestinal: Positive for abdominal pain and nausea. Negative for abdominal distention, anal bleeding, blood in stool, constipation, diarrhea, rectal pain and vomiting.  Genitourinary: Positive for flank pain and frequency. Negative for difficulty urinating, dysuria, testicular pain and urgency.  Skin: Negative for rash.  Neurological: Negative for headaches.     Physical Exam Triage Vital Signs ED Triage Vitals  Enc Vitals Group     BP  06/17/17 1527 118/76     Pulse Rate 06/17/17 1527 63     Resp --      Temp 06/17/17 1527 98.3 F (36.8 C)     Temp Source 06/17/17 1527 Oral     SpO2 06/17/17 1527 96 %     Weight 06/17/17 1528 243 lb 4 oz (110.3 kg)     Height 06/17/17 1528 6' (1.829 m)     Head Circumference --      Peak Flow --      Pain Score 06/17/17 1528 8     Pain Loc --      Pain Edu? --      Excl. in [place]? --    No data found.   Updated Vital Signs BP  118/76 (BP Location: Left Arm)   Pulse 63   Temp 98.3 F (36.8 C) (Oral)   Ht 6' (1.829 m)   Wt 243 lb 4 oz (110.3 kg)   SpO2 96%   BMI 32.99 kg/m   Visual Acuity Right Eye Distance:   Left Eye Distance:   Bilateral Distance:    Right Eye Near:   Left Eye Near:    Bilateral Near:     Physical Exam  Constitutional: He appears well-developed and well-nourished. No distress.  HENT:  Head: Normocephalic.  Right Ear: External ear normal.  Left Ear: External ear normal.  Nose: Nose normal.  Mouth/Throat: Oropharynx is clear and moist.  Eyes: Conjunctivae are normal. Pupils are equal, round, and reactive to light.  Neck: Neck supple.  Cardiovascular: Normal heart sounds.   Pulmonary/Chest: Breath sounds normal.  Abdominal: Soft. Bowel sounds are normal. He exhibits no distension and no mass. There is no hepatosplenomegaly. There is tenderness in the periumbilical area and left lower quadrant. There is no rebound, no guarding and no tenderness at McBurney's point. No hernia.    There is vague tenderness to palpation over the course of ascending and descending colon.  Mild left flank tenderness but no left CVA tenderness.  Musculoskeletal: He exhibits no edema.  Lymphadenopathy:    He has no cervical adenopathy.  Neurological: He is alert.  Skin: Skin is warm and dry. No rash noted.  Nursing note and vitals reviewed.    UC Treatments / Results  Labs (all labs ordered are listed, but only abnormal results are displayed) Labs Reviewed    POCT URINALYSIS DIP (MANUAL ENTRY) - Abnormal; Notable for the following:       Result Value   Glucose, UA >=1,000 (*)    Blood, UA moderate (*)    All other components within normal limits  POCT CBC W AUTO DIFF ([facility]):  WBC 6.8; LY 24.0; MO 2.5; GR 73.5; Hgb 14.2; Platelets 119     EKG  EKG Interpretation None       Radiology No results found.  Procedures Procedures (including critical care time)  Medications Ordered in UC Medications - No data to display   Initial Impression / Assessment and Plan / UC Course  I have reviewed the triage vital signs and the nursing notes.  Pertinent labs & imaging results that were available during my care of the patient were reviewed by me and considered in my medical decision making (see chart for details).    Patient's exam and GI symptoms are suggestive of acute diverticulitis, but he has a normal WBC (6.8) and past history of several colonoscopies that did not show diverticulosis. Concern for renal colic with his symptoms of vague left abdominal and flank pain, urinalysis showing moderate microscopic hematuria, and a past history of nephrolithiasis shown incidentally on X-rays performed for other indications. Unable to perform CT renal stone study in clinic this afternoon; recommend that patient follow-up with his PCP tomorrow for further evaluation.  Note significant glucosuria on urinalysis; patient admits that he had a significant amount of sweet fluids earlier today.  Review of records indicate that his last Hgb A1C was 6.4 on 01/04/17.  For now, begin clear liquids for about 24 hours, then may begin a [employer] (Bananas, Rice, Applesauce, Toast) when improved. Then gradually advance to a regular diet as tolerated.     Rx for Lortab for pain. Controlled Substance Prescriptions I have consulted the [place] Controlled Substances Registry for  this patient, and feel the risk/benefit ratio today is favorable for proceeding with  this prescription for a controlled substance.    If symptoms become significantly worse during the night or over the weekend, proceed to the local emergency room.     Final Clinical Impressions(s) / UC Diagnoses   Final diagnoses:  Left lower quadrant pain    New Prescriptions New Prescriptions   HYDROCODONE-ACETAMINOPHEN (NORCO/VICODIN) 5-325 MG TABLET    Take 1 tablet by mouth every 6 (six) hours as needed for moderate pain.     Kandra Nicolas, MD 06/18/17 [phone]

## 2017-06-18 ENCOUNTER — Ambulatory Visit (INDEPENDENT_AMBULATORY_CARE_PROVIDER_SITE_OTHER): Payer: BLUE CROSS/BLUE SHIELD

## 2017-06-18 ENCOUNTER — Encounter: Payer: Self-pay | Admitting: Osteopathic Medicine

## 2017-06-18 ENCOUNTER — Ambulatory Visit (INDEPENDENT_AMBULATORY_CARE_PROVIDER_SITE_OTHER): Payer: BLUE CROSS/BLUE SHIELD | Admitting: Osteopathic Medicine

## 2017-06-18 VITALS — BP 129/79 | HR 56 | Temp 97.7°F | Wt 244.0 lb

## 2017-06-18 DIAGNOSIS — R109 Unspecified abdominal pain: Secondary | ICD-10-CM

## 2017-06-18 DIAGNOSIS — R1032 Left lower quadrant pain: Secondary | ICD-10-CM

## 2017-06-18 DIAGNOSIS — N200 Calculus of kidney: Secondary | ICD-10-CM

## 2017-06-18 DIAGNOSIS — N135 Crossing vessel and stricture of ureter without hydronephrosis: Secondary | ICD-10-CM

## 2017-06-18 LAB — URINALYSIS, ROUTINE W REFLEX MICROSCOPIC
Bilirubin Urine: NEGATIVE
Hgb urine dipstick: NEGATIVE
Ketones, ur: NEGATIVE
Leukocytes, UA: NEGATIVE
NITRITE: NEGATIVE
Protein, ur: NEGATIVE
SPECIFIC GRAVITY, URINE: 1.031 (ref 1.001–1.035)
pH: 5 (ref 5.0–8.0)

## 2017-06-18 LAB — URINALYSIS, MICROSCOPIC ONLY
BACTERIA UA: NONE SEEN [HPF]
CRYSTALS: NONE SEEN [HPF]
Casts: NONE SEEN [LPF]
RBC / HPF: NONE SEEN RBC/HPF (ref <=2)
Squamous Epithelial / LPF: NONE SEEN [HPF] (ref <=5)
WBC UA: NONE SEEN WBC/HPF (ref <=5)
Yeast: NONE SEEN [HPF]

## 2017-06-18 LAB — CBC
HCT: 45.8 % (ref 38.5–50.0)
Hemoglobin: 15.1 g/dL (ref 13.2–17.1)
MCH: 29 pg (ref 27.0–33.0)
MCHC: 33 g/dL (ref 32.0–36.0)
MCV: 87.9 fL (ref 80.0–100.0)
MPV: 8.8 fL (ref 7.5–12.5)
PLATELETS: 100 10*3/uL — AB (ref 140–400)
RBC: 5.21 MIL/uL (ref 4.20–5.80)
RDW: 14.4 % (ref 11.0–15.0)
WBC: 8.1 10*3/uL (ref 3.8–10.8)

## 2017-06-18 LAB — COMPLETE METABOLIC PANEL WITH GFR
ALT: 24 U/L (ref 9–46)
AST: 19 U/L (ref 10–35)
Albumin: 4.4 g/dL (ref 3.6–5.1)
Alkaline Phosphatase: 50 U/L (ref 40–115)
BUN: 15 mg/dL (ref 7–25)
CHLORIDE: 104 mmol/L (ref 98–110)
CO2: 25 mmol/L (ref 20–31)
CREATININE: 1.36 mg/dL — AB (ref 0.70–1.25)
Calcium: 9.2 mg/dL (ref 8.6–10.3)
GFR, Est African American: 63 mL/min (ref >=60)
GFR, Est Non African American: 55 mL/min — ABNORMAL LOW (ref >=60)
GLUCOSE: 158 mg/dL — AB (ref 65–99)
Potassium: 4.7 mmol/L (ref 3.5–5.3)
SODIUM: 137 mmol/L (ref 135–146)
Total Bilirubin: 1.6 mg/dL — ABNORMAL HIGH (ref 0.2–1.2)
Total Protein: 6.7 g/dL (ref 6.1–8.1)

## 2017-06-18 LAB — LIPASE: Lipase: 53 U/L (ref 7–60)

## 2017-06-18 NOTE — Progress Notes (Signed)
HPI: William Webster is a 66 y.o. male  who presents to [facility] today, 06/18/17,  for chief complaint of:  Chief Complaint  Patient presents with  . Abdominal Pain  . Back Pain    Pain:  . Context: concern about kidney stone  . Location: LLQ radiating to back  . Quality: dull/sharp alternating . Timing: constant w/ waxing/waning . Modifying factors: Hydrocodone  Last taken 07:00 today  . Assoc signs/symptoms: no fever/chills, no diarrhea, +constipation (no BM today)  UC notes reviewed - L sided abd pain radiating to back, normal BM, some nausea, no voiding issues  Labs: see below +glycosuria, +hematuria, I don't see scanned CBC yet  Imaging: none  CLD advised x24 h Rx Hydrocodone-APAP 5-325   Results for orders placed or performed during the hospital encounter of 06/17/17 (from the past 48 hour(s))  POCT urinalysis dipstick     Status: Abnormal   Collection Time: 06/17/17  3:52 PM  Result Value Ref Range   Color, UA yellow yellow   Clarity, UA clear clear   Glucose, UA >=1,000 (A) negative mg/dL   Bilirubin, UA negative negative   Ketones, POC UA negative negative mg/dL   Spec Grav, UA 1.010 1.010 - 1.025   Blood, UA moderate (A) negative   pH, UA 5.0 5.0 - 8.0   Protein Ur, POC negative negative mg/dL   Urobilinogen, UA 0.2 0.2 or 1.0 E.U./dL   Nitrite, UA Negative Negative   Leukocytes, UA Negative Negative  POCT CBC w auto diff     Status: None   Collection Time: 06/17/17  3:53 PM  Result Value Ref Range   WBC  4.5 - 10.5 K/uL    Comment: See Scanned Results   Lymphocytes relative %  15 - 45 %   Monocytes relative %  2 - 10 %   Neutrophils relative % (GR)  44 - 76 %   Lymphocytes absolute  0.1 - 1.8 K/uL   Monocyes absolute  0.1 - 1 K/uL   Neutrophils absolute (GR#)  1.7 - 7.7 K/uL   RBC  4.2 - 5.8 MIL/uL   Hemoglobin  13 - 17 g/dL   Hematocrit  38.5 - 51 %   MCV  80 - 98 fL   MCH  26.5 - 32.5 pg   MCHC  32.5 -  36.9 g/dL   RDW  11.6 - 14 %   Platelet count  140 - 400 K/uL   MPV  7.8 - 11 fL      Past medical history, surgical history, social history and family history reviewed.  Patient Active Problem List   Diagnosis Date Noted  . Screening for colon cancer 12/10/2015  . Fracture of ankle, bimalleolar, right, closed 11/22/2015  . Pituitary adenoma ([place]) 08/17/2015  . Hyperprolactinemia ([place]) 08/17/2015  . Obesity 07/15/2015  . Spondylosis of lumbar region without myelopathy or radiculopathy 07/15/2015  . Primary osteoarthritis of both knees 07/15/2015  . Right cervical radiculopathy 01/07/2015  . HLD (hyperlipidemia) 01/05/2014  . Diabetes mellitus type 2, controlled, without complications ([place]) 30/86/5784    Current medication list and allergy/intolerance information reviewed.   Current Outpatient Prescriptions on File Prior to Visit  Medication Sig Dispense Refill  . cabergoline (DOSTINEX) 0.5 MG tablet Take 1 tablet (0.5 mg total) by mouth 2 (two) times a week. 24 tablet 1  . diclofenac (VOLTAREN) 75 MG EC tablet Take 1 tablet (75 mg total) by mouth 2 (two) times daily. [place]  tablet 3  . glucose blood test strip Check blood sugar twice daily. Diagnosis DM ICD 10 - E11.9 200 each prn  . GLYXAMBI 25-5 MG TABS TAKE 1 TABLET BY MOUTH DAILY 30 tablet 8  . HYDROcodone-acetaminophen (NORCO/VICODIN) 5-325 MG tablet Take 1 tablet by mouth every 6 (six) hours as needed for moderate pain. 12 tablet 0  . ipratropium (ATROVENT) 0.03 % nasal spray Place 1-2 sprays into both nostrils 4 (four) times daily. 30 mL 0  . meloxicam (MOBIC) 15 MG tablet TAKE 1 TABLET BY MOUTH EVERY MORINING WITH BREAKFAST FOR 2 WEEKS, THEN DAILY AS NEEDED FOR PAIN 30 tablet 2  . metFORMIN (GLUCOPHAGE) 1000 MG tablet Take 1 tablet (1,000 mg total) by mouth 2 (two) times daily with a meal. 180 tablet 3  . ONE TOUCH ULTRA TEST test strip CHECK BLOOD SUGAR TWICE DAILY. DIAGNOSIS DM ICD 10 - E11.9 100 each 4  . pioglitazone  (ACTOS) 15 MG tablet Take 1 tablet (15 mg total) by mouth daily. 90 tablet 3  . predniSONE (DELTASONE) 50 MG tablet One tab PO daily for 5 days. 5 tablet 0  . rosuvastatin (CRESTOR) 40 MG tablet Take 1 tablet (40 mg total) by mouth daily. 90 tablet 3   No current facility-administered medications on file prior to visit.    No Known Allergies    Review of Systems:  Constitutional: No recent illness  HEENT: No  headache, no vision change  Cardiac: No  chest pain, No  pressure, No palpitations  Respiratory:  No  shortness of breath. No  Cough  Gastrointestinal: No  abdominal pain, no change on bowel habits  Musculoskeletal: No new myalgia/arthralgia  Skin: No  Rash  Hem/Onc: No  easy bruising/bleeding, No  abnormal lumps/bumps  Neurologic: No  weakness, No  Dizziness  Psychiatric: No  concerns with depression, No  concerns with anxiety  Exam:  BP 129/79   Pulse (!) 56   Temp 97.7 F (36.5 C) (Oral)   Wt 244 lb (110.7 kg)   BMI 33.09 kg/m   Constitutional: VS see above. General Appearance: alert, well-developed, well-nourished, NAD  Eyes: Normal lids and conjunctive, non-icteric sclera  Ears, Nose, Mouth, Throat: MMM, Normal external inspection ears/nares/mouth/lips/gums.  Neck: No masses, trachea midline.   Respiratory: Normal respiratory effort. no wheeze, no rhonchi, no rales  Cardiovascular: S1/S2 normal, no murmur, no rub/gallop auscultated. RRR.   Musculoskeletal: Gait normal. Symmetric and independent movement of all extremities  Neurological: Normal balance/coordination. No tremor.  Skin: warm, dry, intact.   Psychiatric: Normal judgment/insight. Normal mood and affect. Oriented x3.    Results for orders placed or performed in visit on 06/18/17 (from the past 48 hour(s))  CBC     Status: Abnormal   Collection Time: 06/18/17  9:19 AM  Result Value Ref Range   WBC 8.1 3.8 - 10.8 K/uL   RBC 5.21 4.20 - 5.80 MIL/uL   Hemoglobin 15.1 13.2 - 17.1 g/dL    HCT 45.8 38.5 - 50.0 %   MCV 87.9 80.0 - 100.0 fL   MCH 29.0 27.0 - 33.0 pg   MCHC 33.0 32.0 - 36.0 g/dL   RDW 14.4 11.0 - 15.0 %   Platelets 100 (L) 140 - 400 K/uL   MPV 8.8 7.5 - 12.5 fL  COMPLETE METABOLIC PANEL WITH GFR     Status: Abnormal   Collection Time: 06/18/17  9:19 AM  Result Value Ref Range   Sodium 137 135 - 146 mmol/L  Potassium 4.7 3.5 - 5.3 mmol/L   Chloride 104 98 - 110 mmol/L   CO2 25 20 - 31 mmol/L   Glucose, Bld 158 (H) 65 - 99 mg/dL   BUN 15 7 - 25 mg/dL   Creat 1.36 (H) 0.70 - 1.25 mg/dL    Comment:   For patients > or = 64 years of age: The upper reference limit for Creatinine is approximately 13% higher for people identified as African-American.      Total Bilirubin 1.6 (H) 0.2 - 1.2 mg/dL   Alkaline Phosphatase 50 40 - 115 U/L   AST 19 10 - 35 U/L   ALT 24 9 - 46 U/L   Total Protein 6.7 6.1 - 8.1 g/dL   Albumin 4.4 3.6 - 5.1 g/dL   Calcium 9.2 8.6 - 10.3 mg/dL   GFR, Est African American 63 >=60 mL/min   GFR, Est Non African American 55 (L) >=60 mL/min  Lipase     Status: None   Collection Time: 06/18/17  9:19 AM  Result Value Ref Range   Lipase 53 7 - 60 U/L     Ct Renal Stone Study  Result Date: 06/18/2017 CLINICAL DATA:  Left-sided flank pain for several days with hematuria, initial encounter EXAM: CT ABDOMEN AND PELVIS WITHOUT CONTRAST TECHNIQUE: Multidetector CT imaging of the abdomen and pelvis was performed following the standard protocol without IV contrast. COMPARISON:  07/15/2015 FINDINGS: Lower chest: Within normal limits. Hepatobiliary: No focal liver abnormality is seen. No gallstones, gallbladder wall thickening, or biliary dilatation. Pancreas: Unremarkable. No pancreatic ductal dilatation or surrounding inflammatory changes. Spleen: Normal in size without focal abnormality. Adrenals/Urinary Tract: The adrenal glands are within normal limits bilaterally. The right kidney and collecting system are unremarkable. The bladder is  partially distended. The left kidney shows evidence of a renal pelvic calculus causing mild obstructive changes. This measures approximately 13 mm in greatest dimension. No other renal calculi are seen. Stomach/Bowel: The appendix is not well visualized although no inflammatory changes are seen. No obstructive or inflammatory changes are noted within the bowel. Vascular/Lymphatic: Aortic atherosclerosis. No enlarged abdominal or pelvic lymph nodes. Reproductive: Prostate is unremarkable. Other: No abdominal wall hernia or abnormality. No abdominopelvic ascites. Musculoskeletal: Degenerative changes of the lumbar spine are seen. Hemangioma is noted within the L4 vertebral body on the left. IMPRESSION: 13 mm left renal pelvic stone with mild obstructive change. Nonvisualization of the appendix although no inflammatory changes are noted. Electronically Signed   By: Inez Catalina M.D.   On: 06/18/2017 10:03       ASSESSMENT/PLAN:   Acute left flank pain - Plan: CBC, COMPLETE METABOLIC PANEL WITH GFR, Lipase, Urinalysis, Routine w reflex microscopic, CT RENAL STONE STUDY, CANCELED: CT RENAL STONE STUDY  LLQ pain - Plan: CBC, COMPLETE METABOLIC PANEL WITH GFR, Lipase, Urinalysis, Routine w reflex microscopic, CT RENAL STONE STUDY, CANCELED: CT RENAL STONE STUDY  Left nephrolithiasis - Plan: Ambulatory referral to Urology  Ureteral obstruction, left - Plan: Ambulatory referral to Urology     Follow-up plan: Return if symptoms worsen or fail to improve.  Visit summary with medication list and pertinent instructions was printed for patient to review, alert [place] if any changes needed. All questions at time of visit were answered - patient instructed to contact office with any additional concerns. ER/RTC precautions were reviewed with the patient and understanding verbalized.

## 2017-07-05 ENCOUNTER — Encounter: Payer: Self-pay | Admitting: Osteopathic Medicine

## 2017-07-05 ENCOUNTER — Ambulatory Visit (INDEPENDENT_AMBULATORY_CARE_PROVIDER_SITE_OTHER): Payer: BLUE CROSS/BLUE SHIELD | Admitting: Osteopathic Medicine

## 2017-07-05 VITALS — BP 130/71 | HR 64 | Temp 98.0°F | Wt 238.0 lb

## 2017-07-05 DIAGNOSIS — E119 Type 2 diabetes mellitus without complications: Secondary | ICD-10-CM | POA: Diagnosis not present

## 2017-07-05 LAB — POCT GLYCOSYLATED HEMOGLOBIN (HGB A1C): HEMOGLOBIN A1C: 6.9

## 2017-07-05 NOTE — Progress Notes (Signed)
HPI: William Webster is a 66 y.o. male  who presents to [facility] today, 07/05/17,  for chief complaint of:  Chief Complaint  Patient presents with  . Diabetes     DIABETES SCREENING/PREVENTIVE CARE: updated 07/04/2016 A1C past 3-6 mos: Yes  controlled? Yes  07/04/16: 6.1%  01/04/17: 6.4%  07/05/17: 6.9% - has been cheating on diet!  BP goal <130/80: Yes  LDL goal <70: 91 in 12/2015, 43 and 12/2016  Eye exam annually: Yes (04/2016), need records.  Foot exam: 01/12/2016, 07/05/17  Microalbuminuria: negative Metformin: Yes  ACE/ARB: No - BP good  Antiplatelet if ASCVD Risk >10%: No  Statin: Yes       Past medical history, surgical history, social history and family history reviewed.  Patient Active Problem List   Diagnosis Date Noted  . Screening for colon cancer 12/10/2015  . Fracture of ankle, bimalleolar, right, closed 11/22/2015  . Pituitary adenoma ([place]) 08/17/2015  . Hyperprolactinemia ([place]) 08/17/2015  . Obesity 07/15/2015  . Spondylosis of lumbar region without myelopathy or radiculopathy 07/15/2015  . Primary osteoarthritis of both knees 07/15/2015  . Right cervical radiculopathy 01/07/2015  . HLD (hyperlipidemia) 01/05/2014  . Diabetes mellitus type 2, controlled, without complications ([place]) 83/15/1761    Current medication list and allergy/intolerance information reviewed.   Current Outpatient Prescriptions on File Prior to Visit  Medication Sig Dispense Refill  . cabergoline (DOSTINEX) 0.5 MG tablet Take 1 tablet (0.5 mg total) by mouth 2 (two) times a week. 24 tablet 1  . diclofenac (VOLTAREN) 75 MG EC tablet Take 1 tablet (75 mg total) by mouth 2 (two) times daily. 60 tablet 3  . glucose blood test strip Check blood sugar twice daily. Diagnosis DM ICD 10 - E11.9 200 each prn  . GLYXAMBI 25-5 MG TABS TAKE 1 TABLET BY MOUTH DAILY 30 tablet 8  . HYDROcodone-acetaminophen (NORCO/VICODIN) 5-325 MG tablet Take 1  tablet by mouth every 6 (six) hours as needed for moderate pain. 12 tablet 0  . ipratropium (ATROVENT) 0.03 % nasal spray Place 1-2 sprays into both nostrils 4 (four) times daily. 30 mL 0  . meloxicam (MOBIC) 15 MG tablet TAKE 1 TABLET BY MOUTH EVERY MORINING WITH BREAKFAST FOR 2 WEEKS, THEN DAILY AS NEEDED FOR PAIN 30 tablet 2  . metFORMIN (GLUCOPHAGE) 1000 MG tablet Take 1 tablet (1,000 mg total) by mouth 2 (two) times daily with a meal. 180 tablet 3  . ONE TOUCH ULTRA TEST test strip CHECK BLOOD SUGAR TWICE DAILY. DIAGNOSIS DM ICD 10 - E11.9 100 each 4  . pioglitazone (ACTOS) 15 MG tablet Take 1 tablet (15 mg total) by mouth daily. 90 tablet 3  . predniSONE (DELTASONE) 50 MG tablet One tab PO daily for 5 days. 5 tablet 0  . rosuvastatin (CRESTOR) 40 MG tablet Take 1 tablet (40 mg total) by mouth daily. 90 tablet 3   No current facility-administered medications on file prior to visit.    No Known Allergies    Review of Systems:  Constitutional: No recent illness  HEENT: No  headache, no vision change  Cardiac: No  chest pain, No  pressure, No palpitations  Respiratory:  No  shortness of breath. No  Cough  Musculoskeletal: No new myalgia/arthralgia  Neurologic: No  weakness, No  Dizziness   Exam:  BP 130/71   Pulse 64   Temp 98 F (36.7 C) (Oral)   Wt 238 lb (108 kg)   BMI 32.28 kg/m  Constitutional: VS see above. General Appearance: alert, well-developed, well-nourished, NAD  Eyes: Normal lids and conjunctive, non-icteric sclera  Ears, Nose, Mouth, Throat: MMM, Normal external inspection ears/nares/mouth/lips/gums.  Neck: No masses, trachea midline.   Respiratory: Normal respiratory effort. no wheeze, no rhonchi, no rales  Cardiovascular: S1/S2 normal, no murmur, no rub/gallop auscultated. RRR.   Musculoskeletal: Gait normal. Symmetric and independent movement of all extremities  Neurological: Normal balance/coordination. No tremor.  Skin: warm, dry, intact.    Psychiatric: Normal judgment/insight. Normal mood and affect. Oriented x3.    Results for orders placed or performed in visit on 07/05/17 (from the past 24 hour(s))  POCT HgB A1C     Status: None   Collection Time: 07/05/17  8:29 AM  Result Value Ref Range   Hemoglobin A1C 6.9      ASSESSMENT/PLAN:   Controlled type 2 diabetes mellitus without complication, without long-term current use of insulin ([place]) - Plan: POCT HgB A1C    Patient Instructions  Plan:  Continue current medications!  Return in 3 months to recheck A1C.   Watch the beer and sugars!     Follow-up plan: Return in about 4 months (around 11/05/2017) for recheck A1C.  Visit summary with medication list and pertinent instructions was printed for patient to review, alert [place] if any changes needed. All questions at time of visit were answered - patient instructed to contact office with any additional concerns. ER/RTC precautions were reviewed with the patient and understanding verbalized.

## 2017-07-05 NOTE — Patient Instructions (Addendum)
Plan:  Continue current medications!  Return in 3 months to recheck A1C.   Watch the beer and sugars!

## 2017-08-19 ENCOUNTER — Other Ambulatory Visit: Payer: Self-pay | Admitting: Osteopathic Medicine

## 2017-08-19 DIAGNOSIS — E119 Type 2 diabetes mellitus without complications: Secondary | ICD-10-CM

## 2017-08-30 ENCOUNTER — Encounter: Payer: Self-pay | Admitting: Osteopathic Medicine

## 2017-10-23 ENCOUNTER — Telehealth: Payer: Self-pay | Admitting: *Deleted

## 2017-10-23 NOTE — Telephone Encounter (Signed)
Pre Authorization sent to cover my meds. [id number]

## 2017-11-05 ENCOUNTER — Encounter: Payer: Self-pay | Admitting: Osteopathic Medicine

## 2017-11-05 ENCOUNTER — Ambulatory Visit: Payer: PRIVATE HEALTH INSURANCE | Admitting: Osteopathic Medicine

## 2017-11-05 VITALS — BP 128/81 | HR 61 | Wt 246.0 lb

## 2017-11-05 DIAGNOSIS — E119 Type 2 diabetes mellitus without complications: Secondary | ICD-10-CM | POA: Diagnosis not present

## 2017-11-05 LAB — POCT GLYCOSYLATED HEMOGLOBIN (HGB A1C): HEMOGLOBIN A1C: 6.9

## 2017-11-05 NOTE — Progress Notes (Signed)
HPI: William Webster is a 66 y.o. male  who presents to [place] today, 11/05/17,  for chief complaint of:  Chief Complaint  Patient presents with  . Follow-up    diabetes     DIABETES SCREENING/PREVENTIVE CARE: updated 07/04/2016 A1C past 3-6 mos: Yes  controlled? Yes  07/04/16: 6.1%  01/04/17: 6.4%  07/05/17: 6.9% - has been cheating on diet!   11/05/17: 6.9% - stable BP goal <130/80: Yes  LDL goal <70: 91 in 12/2015, 43 and 12/2016  Eye exam annually: Yes (04/2016), need records.  Foot exam: 01/12/2016, 07/05/17  Microalbuminuria: negative Metformin: Yes  ACE/ARB: No - BP good  Antiplatelet if ASCVD Risk >10%: No  Statin: Yes       Past medical history, surgical history, social history and family history reviewed.  Patient Active Problem List   Diagnosis Date Noted  . Screening for colon cancer 12/10/2015  . Fracture of ankle, bimalleolar, right, closed 11/22/2015  . Pituitary adenoma ([place]) 08/17/2015  . Hyperprolactinemia ([place]) 08/17/2015  . Obesity 07/15/2015  . Spondylosis of lumbar region without myelopathy or radiculopathy 07/15/2015  . Primary osteoarthritis of both knees 07/15/2015  . Right cervical radiculopathy 01/07/2015  . HLD (hyperlipidemia) 01/05/2014  . Diabetes mellitus type 2, controlled, without complications ([place]) 64/40/3474    Current medication list and allergy/intolerance information reviewed.   Current Outpatient Medications on File Prior to Visit  Medication Sig Dispense Refill  . cabergoline (DOSTINEX) 0.5 MG tablet Take 1 tablet (0.5 mg total) by mouth 2 (two) times a week. 24 tablet 1  . diclofenac (VOLTAREN) 75 MG EC tablet Take 1 tablet (75 mg total) by mouth 2 (two) times daily. 60 tablet 3  . glucose blood test strip Check blood sugar twice daily. Diagnosis DM ICD 10 - E11.9 200 each prn  . GLYXAMBI 25-5 MG TABS TAKE 1 TABLET BY MOUTH DAILY 30 tablet 3  . metFORMIN (GLUCOPHAGE) 1000 MG  tablet Take 1 tablet (1,000 mg total) by mouth 2 (two) times daily with a meal. 180 tablet 3  . ONE TOUCH ULTRA TEST test strip CHECK BLOOD SUGAR TWICE DAILY. DIAGNOSIS DM ICD 10 - E11.9 100 each 4  . pioglitazone (ACTOS) 15 MG tablet Take 1 tablet (15 mg total) by mouth daily. 90 tablet 3  . rosuvastatin (CRESTOR) 40 MG tablet Take 1 tablet (40 mg total) by mouth daily. 90 tablet 3   No current facility-administered medications on file prior to visit.    No Known Allergies    Review of Systems:  Constitutional: No recent illness  HEENT: No  headache, no vision change  Cardiac: No  chest pain, No  pressure, No palpitations  Respiratory:  No  shortness of breath. No  Cough  Musculoskeletal: No new myalgia/arthralgia  Neurologic: No  weakness, No  Dizziness   Exam:  BP 128/81   Pulse 61   Wt 246 lb (111.6 kg)   BMI 33.36 kg/m    Constitutional: VS see above. General Appearance: alert, well-developed, well-nourished, NAD  Eyes: Normal lids and conjunctive, non-icteric sclera  Ears, Nose, Mouth, Throat: MMM, Normal external inspection ears/nares/mouth/lips/gums.  Neck: No masses, trachea midline.   Respiratory: Normal respiratory effort. no wheeze, no rhonchi, no rales  Cardiovascular: S1/S2 normal, no murmur, no rub/gallop auscultated. RRR.   Musculoskeletal: Gait normal. Symmetric and independent movement of all extremities  Neurological: Normal balance/coordination. No tremor.  Skin: warm, dry, intact.   Psychiatric: Normal judgment/insight. Normal mood and affect.  Oriented x3.    Results for orders placed or performed in visit on 11/05/17 (from the past 24 hour(s))  POCT HgB A1C     Status: None   Collection Time: 11/05/17  8:19 AM  Result Value Ref Range   Hemoglobin A1C 6.9      ASSESSMENT/PLAN:   Diabetes mellitus without complication ([place]) - Plan: POCT HgB A1C - continue current medications, doing well. Emphasized diet/lifestyle modifications,  especially through the holidays. We'll see him next year     Follow-up plan: Return in about 4 months (around 03/05/2018) for [employer] RECHECK .  Visit summary with medication list and pertinent instructions was printed for patient to review, alert [place] if any changes needed. All questions at time of visit were answered - patient instructed to contact office with any additional concerns. ER/RTC precautions were reviewed with the patient and understanding verbalized.

## 2017-11-08 ENCOUNTER — Telehealth: Payer: Self-pay | Admitting: *Deleted

## 2017-11-08 NOTE — Telephone Encounter (Signed)
Resubmitted through [employer] Diabetes Medication Form  [id number])

## 2017-11-08 NOTE — Telephone Encounter (Signed)
[facility] does not process this client's PA. PA resubmitted

## 2017-11-28 ENCOUNTER — Other Ambulatory Visit: Payer: Self-pay

## 2017-11-28 DIAGNOSIS — E119 Type 2 diabetes mellitus without complications: Secondary | ICD-10-CM

## 2017-11-28 MED ORDER — ROSUVASTATIN CALCIUM 40 MG PO TABS: 40.0000 mg | ORAL_TABLET | Freq: Every day | ORAL | 0 refills | Status: DC

## 2017-11-28 NOTE — Telephone Encounter (Signed)
Rx Crestor refilled #[id number] Patient needs Labs for further refills

## 2017-12-10 NOTE — Telephone Encounter (Signed)
closed

## 2017-12-10 NOTE — Telephone Encounter (Signed)
Called to check on the status of PA and insurance termed on 11/23/2017

## 2017-12-14 ENCOUNTER — Telehealth: Payer: Self-pay | Admitting: *Deleted

## 2017-12-14 NOTE — Telephone Encounter (Signed)
Received form from patients rx provider [facility] stating this med needs a prior auth. When I last looked this med up [facility] stated they did not provid rx benefits for this patient. I did submit the filled out form per [facility] request

## 2017-12-19 ENCOUNTER — Telehealth: Payer: Self-pay | Admitting: Osteopathic Medicine

## 2017-12-19 NOTE — Telephone Encounter (Signed)
Patient came in with a concern about his medication, Glyxambi. He has dual Rx insurance coverage; however, it is still costing him well over $100 a month for this medication. He is requesting that a similar, more affordable oral medication be substituted for the Glyxambi. Please advise. Thanks!

## 2017-12-20 MED ORDER — LINAGLIPTIN 5 MG PO TABS: 5.0000 mg | ORAL_TABLET | Freq: Every day | ORAL | 3 refills | Status: DC

## 2017-12-20 MED ORDER — EMPAGLIFLOZIN 25 MG PO TABS: 25.0000 mg | ORAL_TABLET | Freq: Every day | ORAL | 3 refills | Status: DC

## 2017-12-20 NOTE — Telephone Encounter (Signed)
Took Glyxambi off list and sent separate Rx's for linagliptin and empagliflozin which combined are Glyxambi

## 2017-12-20 NOTE — Telephone Encounter (Signed)
Patient advised of medication changes, verbalized understanding. No further inquiries during phone call.

## 2017-12-24 ENCOUNTER — Telehealth: Payer: Self-pay | Admitting: *Deleted

## 2017-12-24 NOTE — Telephone Encounter (Signed)
Pre Authorization sent to cover my meds. [id number] tradjenta

## 2017-12-24 NOTE — Telephone Encounter (Signed)
Pre Authorization sent to cover my meds.[id number]

## 2018-01-03 NOTE — Telephone Encounter (Signed)
[employer] approved form 12/26/2017-9999 is what letter says(?), no notification on jardiance yet  Pharmacy notified

## 2018-01-18 ENCOUNTER — Other Ambulatory Visit: Payer: Self-pay | Admitting: Osteopathic Medicine

## 2018-01-18 ENCOUNTER — Telehealth: Payer: Self-pay | Admitting: Osteopathic Medicine

## 2018-01-18 DIAGNOSIS — E119 Type 2 diabetes mellitus without complications: Secondary | ICD-10-CM

## 2018-01-18 NOTE — Telephone Encounter (Signed)
Patient stopped by to say he had not heard anything about the PA on his jardiance so after making phone calls to his insurance we figured out that it was filed on his old policy and not the new one. I redid the PA on the new insurance and sent through cover my meds now waiting on determination. - CF

## 2018-01-22 ENCOUNTER — Ambulatory Visit: Payer: PRIVATE HEALTH INSURANCE | Admitting: Osteopathic Medicine

## 2018-01-22 ENCOUNTER — Encounter: Payer: Self-pay | Admitting: Osteopathic Medicine

## 2018-01-22 VITALS — BP 125/63 | HR 52 | Temp 97.8°F | Wt 257.1 lb

## 2018-01-22 DIAGNOSIS — B9789 Other viral agents as the cause of diseases classified elsewhere: Secondary | ICD-10-CM

## 2018-01-22 DIAGNOSIS — J069 Acute upper respiratory infection, unspecified: Secondary | ICD-10-CM

## 2018-01-22 MED ORDER — GUAIFENESIN-CODEINE 100-10 MG/5ML PO SOLN
5.0000 mL | Freq: Four times a day (QID) | ORAL | 0 refills | Status: DC | PRN
Start: 2018-01-22 — End: 2018-03-11

## 2018-01-22 MED ORDER — IPRATROPIUM BROMIDE 0.06 % NA SOLN: 2.0000 | Freq: Four times a day (QID) | NASAL | 1 refills | Status: DC

## 2018-01-22 NOTE — Progress Notes (Signed)
HPI: William Webster is a 67 y.o. male who  has a past medical history of Anterior pituitary adenoma syndrome ([place]), Anxiety, Bimalleolar fracture of right ankle, Colon polyp, Diabetes mellitus without complication ([place]), Diabetic neuropathy ([place]), Family history of cancer of GI tract, Family history of colon cancer, Gilbert disease, Hyperlipidemia, Low back pain, Lumbar scoliosis, Prolactinoma ([place]), Sciatica of right side, and Spondylosis of lumbar joint.  he presents to [facility] today, 01/22/18,  for chief complaint of: sick  sinus congestion, sore throat, chills without fever, cough persistent for about 4-5 days at this point. Family members with similar illness. No shortness of breath. No measured temperatures at home.   Past medical, surgical, social and family history reviewed:  Patient Active Problem List   Diagnosis Date Noted  . Screening for colon cancer 12/10/2015  . Fracture of ankle, bimalleolar, right, closed 11/22/2015  . Pituitary adenoma ([place]) 08/17/2015  . Hyperprolactinemia ([place]) 08/17/2015  . Obesity 07/15/2015  . Spondylosis of lumbar region without myelopathy or radiculopathy 07/15/2015  . Primary osteoarthritis of both knees 07/15/2015  . Right cervical radiculopathy 01/07/2015  . HLD (hyperlipidemia) 01/05/2014  . Diabetes mellitus type 2, controlled, without complications ([place]) 40/98/1191    Past Surgical History:  Procedure Laterality Date  . HEMORRHOID SURGERY    . ORIF ANKLE FRACTURE Right 11/24/2015   Procedure: OPEN REDUCTION INTERNAL FIXATION (ORIF) ANKLE FRACTURE;  Surgeon: Dorna Leitz, MD;  Location: [facility];  Service: Orthopedics;  Laterality: Right;  Open reduction internal fixation right ankle fracture  . TONSILLECTOMY      Social History   Tobacco Use  . Smoking status: Current Some Day Smoker    Types: Cigars  . Smokeless tobacco: Never Used  . Tobacco comment: occassional cigar   Substance Use Topics  . Alcohol use: Yes    Alcohol/week: 6.0 oz    Types: 10 Standard drinks or equivalent per week    Comment: social 12-20 drinks a week    Family History  Problem Relation Age of Onset  . Cancer Mother        colon  . Heart failure Mother   . Diabetes Mother   . Hyperlipidemia Mother   . Stroke Mother   . Stroke Father   . Hyperlipidemia Father   . Diabetes Maternal Grandfather   . Heart disease Maternal Grandfather   . Stroke Maternal Grandfather   . Diabetes Paternal Grandfather   . Heart disease Paternal Grandfather   . Stroke Paternal Grandfather      Current medication list and allergy/intolerance information reviewed:    Current Outpatient Medications  Medication Sig Dispense Refill  . cabergoline (DOSTINEX) 0.5 MG tablet Take 1 tablet (0.5 mg total) by mouth 2 (two) times a week. 24 tablet 1  . diclofenac (VOLTAREN) 75 MG EC tablet Take 1 tablet (75 mg total) by mouth 2 (two) times daily. 60 tablet 3  . empagliflozin (JARDIANCE) 25 MG TABS tablet Take 25 mg by mouth daily. 90 tablet 3  . glucose blood test strip Check blood sugar twice daily. Diagnosis DM ICD 10 - E11.9 200 each prn  . linagliptin (TRADJENTA) 5 MG TABS tablet Take 1 tablet (5 mg total) by mouth daily. 90 tablet 3  . metFORMIN (GLUCOPHAGE) 1000 MG tablet TAKE 1 TABLET (1,000 MG TOTAL) BY MOUTH 2 (TWO) TIMES DAILY WITH A MEAL. 180 tablet 0  . ONE TOUCH ULTRA TEST test strip CHECK BLOOD SUGAR TWICE DAILY. DIAGNOSIS DM ICD  10 - E11.9 100 each 4  . pioglitazone (ACTOS) 15 MG tablet Take 1 tablet (15 mg total) by mouth daily. 90 tablet 3  . rosuvastatin (CRESTOR) 40 MG tablet Take 1 tablet (40 mg total) by mouth daily. 90 tablet 0   No current facility-administered medications for this visit.     No Known Allergies    Review of Systems:  Constitutional:  No  fever, +chills, No recent illness, No unintentional weight changes. +significant fatigue.   HEENT: No  headache, no vision  change, no hearing change, +sore throat, +sinus pressure  Cardiac: No  chest pain, No  pressure, No palpitations, No  Orthopnea  Respiratory:  No  shortness of breath. +Cough  Gastrointestinal: No  abdominal pain, No  nausea  Musculoskeletal: No new myalgia/arthralgia  Skin: No  Rash   Exam:  BP 125/63   Pulse (!) 52   Temp 97.8 F (36.6 C) (Oral)   Wt 257 lb 1.9 oz (116.6 kg)   BMI 34.87 kg/m   Constitutional: VS see above. General Appearance: alert, well-developed, well-nourished, NAD  Eyes: Normal lids and conjunctive, non-icteric sclera  Ears, Nose, Mouth, Throat: MMM, Normal external inspection ears/nares/mouth/lips/gums. TM normal bilaterally. Pharynx/tonsils +erythema, no exudate. Nasal mucosa normal.   Neck: No masses, trachea midline. No thyroid enlargement. No tenderness/mass appreciated. No lymphadenopathy  Respiratory: Normal respiratory effort. no wheeze, no rhonchi, no rales  Cardiovascular: S1/S2 normal, no murmur, no rub/gallop auscultated. RRR.   Musculoskeletal: Gait normal.      ASSESSMENT/PLAN:   Viral URI with cough   Meds ordered this encounter  Medications  . ipratropium (ATROVENT) 0.06 % nasal spray    Sig: Place 2 sprays into both nostrils 4 (four) times daily.    Dispense:  15 mL    Refill:  1  . guaiFENesin-codeine 100-10 MG/5ML syrup    Sig: Take 5-10 mLs by mouth every 6 (six) hours as needed for cough.    Dispense:  180 mL    Refill:  0       Patient Instructions  Over-the-Counter Medications & Home Remedies for Upper Respiratory Illness  Note: the following list assumes no pregnancy, normal liver & kidney function and no other drug interactions. Dr. Sheppard Coil has highlighted medications which are safe for you to use, but these may not be appropriate for everyone. Always ask a pharmacist or qualified medical provider if you have any questions!   Aches/Pains, Fever, Headache Acetaminophen (Tylenol) 500 mg tablets - take max 2  tablets (1000 mg) every 6 hours (4 times per day)  Ibuprofen (Motrin) 200 mg tablets - take max 4 tablets (800 mg) every 6 hours*  Sinus Congestion Prescription Atrovent as directed Nasal Saline if desired Phenylephrine (Sudafed) 10 mg tablets every 4 hours (or the 12-hour formulation)* Diphenhydramine (Benadryl) 25 mg tablets - take max 2 tablets every 4 hours  Cough & Sore Throat Prescription cough pills or syrups as directed Dextromethorphan (Robitussin, others) - cough suppressant Guaifenesin (Robitussin, Mucinex, others) - expectorant (helps cough up mucus) (Dextromethorphan and Guaifenesin also come in a combination tablet) Lozenges w/ Benzocaine + Menthol (Cepacol) Honey - as much as you want! Teas which "coat the throat" - look for ingredients Elm Bark, Licorice Root, Marshmallow Root  Other Antibiotics if these are needed - take ALL, even if you're feeling better  Zinc Lozenges within 24 hours of symptoms onset - mixed evidence this shortens the duration of the common cold Don't waste your money on Vitamin C or  Echinacea  *Caution in patients with high blood pressure       Visit summary with medication list and pertinent instructions was printed for patient to review. All questions at time of visit were answered - patient instructed to contact office with any additional concerns. ER/RTC precautions were reviewed with the patient.   Follow-up plan: Return for diabetes check as scheduled, sooner if needed .  Note: Total time spent 25 minutes, greater than 50% of the visit was spent face-to-face counseling and coordinating care for the following: The encounter diagnosis was Viral URI with cough..  Please note: voice recognition software was used to produce this document, and typos may escape review. Please contact Dr. Sheppard Coil for any needed clarifications.

## 2018-01-22 NOTE — Patient Instructions (Signed)
Over-the-Counter Medications & Home Remedies for Upper Respiratory Illness  Note: the following list assumes no pregnancy, normal liver & kidney function and no other drug interactions. Dr. Sheppard Coil has highlighted medications which are safe for you to use, but these may not be appropriate for everyone. Always ask a pharmacist or qualified medical provider if you have any questions!   Aches/Pains, Fever, Headache Acetaminophen (Tylenol) 500 mg tablets - take max 2 tablets (1000 mg) every 6 hours (4 times per day)  Ibuprofen (Motrin) 200 mg tablets - take max 4 tablets (800 mg) every 6 hours*  Sinus Congestion Prescription Atrovent as directed Nasal Saline if desired Phenylephrine (Sudafed) 10 mg tablets every 4 hours (or the 12-hour formulation)* Diphenhydramine (Benadryl) 25 mg tablets - take max 2 tablets every 4 hours  Cough & Sore Throat Prescription cough pills or syrups as directed Dextromethorphan (Robitussin, others) - cough suppressant Guaifenesin (Robitussin, Mucinex, others) - expectorant (helps cough up mucus) (Dextromethorphan and Guaifenesin also come in a combination tablet) Lozenges w/ Benzocaine + Menthol (Cepacol) Honey - as much as you want! Teas which "coat the throat" - look for ingredients Elm Bark, Licorice Root, Marshmallow Root  Other Antibiotics if these are needed - take ALL, even if you're feeling better  Zinc Lozenges within 24 hours of symptoms onset - mixed evidence this shortens the duration of the common cold Don't waste your money on Vitamin C or Echinacea  *Caution in patients with high blood pressure

## 2018-01-25 NOTE — Telephone Encounter (Signed)
I received a fax stating [facility] is not patient's pharmacy anymore so I called the number on the card and they faxed a form for the prior auth. I filled it out and placed it in the providers box for signature. I will fax once signed. - CF

## 2018-02-04 NOTE — Telephone Encounter (Signed)
Received fax from [facility] and they approved Jardiance. I will notify pharmacy. - CF  Valid: 12/26/17 - 9735

## 2018-02-13 ENCOUNTER — Other Ambulatory Visit: Payer: Self-pay

## 2018-02-13 MED ORDER — IPRATROPIUM BROMIDE 0.06 % NA SOLN: 2.0000 | Freq: Four times a day (QID) | NASAL | 1 refills | Status: DC

## 2018-02-26 ENCOUNTER — Other Ambulatory Visit: Payer: Self-pay | Admitting: Osteopathic Medicine

## 2018-02-26 DIAGNOSIS — E221 Hyperprolactinemia: Secondary | ICD-10-CM

## 2018-02-26 NOTE — Telephone Encounter (Signed)
Pharmacy requesting medication refill. Rx was written by another provider. Pls advise, if appropriate.

## 2018-02-26 NOTE — Telephone Encounter (Signed)
Left a brief vm msg for pt regarding approval of med refill. Call back information provided.

## 2018-03-07 ENCOUNTER — Ambulatory Visit: Payer: PRIVATE HEALTH INSURANCE | Admitting: Osteopathic Medicine

## 2018-03-07 ENCOUNTER — Encounter: Payer: Self-pay | Admitting: Osteopathic Medicine

## 2018-03-07 VITALS — BP 112/67 | HR 56 | Temp 97.9°F | Wt 249.0 lb

## 2018-03-07 DIAGNOSIS — E119 Type 2 diabetes mellitus without complications: Secondary | ICD-10-CM

## 2018-03-07 DIAGNOSIS — R490 Dysphonia: Secondary | ICD-10-CM | POA: Diagnosis not present

## 2018-03-07 LAB — POCT GLYCOSYLATED HEMOGLOBIN (HGB A1C): Hemoglobin A1C: 7.5

## 2018-03-07 NOTE — Progress Notes (Signed)
HPI: William Webster is a 67 y.o. male  who presents to [facility] today, 03/07/18,  for chief complaint of:  DM2 followup Hoarseness, dry cough x1 week      DIABETES SCREENING/PREVENTIVE CARE: updated 03/07/18  A1C past 3-6 mos: Yes  controlled? Yes  07/04/16: 6.1%  01/04/17: 6.4%  07/05/17: 6.9% - has been cheating on diet!   11/05/17: 6.9% - stable  03/07/18: 7.5% - up a bit from last time! Reports has been slipping a bit on his diet.  BP goal <130/80: Yes  LDL goal <70: 91 in 12/2015, 43 and 12/2016  Eye exam annually: Yes (04/2016), need records.  Foot exam: 01/12/2016, 07/05/17  Microalbuminuria: negative Metformin: Yes  ACE/ARB: No - BP good  Antiplatelet if ASCVD Risk >10%: No  Statin: Yes   Hoarseness: dry cough for about a week, has been using old cough syrup and atrovent nasal spray I rx'ed back when he was sick w/ flu like illness. No heartburn but (+)Hx GERD, no dysphagia, no fever.        Past medical history, surgical history, social history and family history reviewed.  Patient Active Problem List   Diagnosis Date Noted  . Screening for colon cancer 12/10/2015  . Fracture of ankle, bimalleolar, right, closed 11/22/2015  . Pituitary adenoma ([place]) 08/17/2015  . Hyperprolactinemia ([place]) 08/17/2015  . Obesity 07/15/2015  . Spondylosis of lumbar region without myelopathy or radiculopathy 07/15/2015  . Primary osteoarthritis of both knees 07/15/2015  . Right cervical radiculopathy 01/07/2015  . HLD (hyperlipidemia) 01/05/2014  . Diabetes mellitus type 2, controlled, without complications ([place]) 90/24/0973    Current medication list and allergy/intolerance information reviewed.   Current Outpatient Medications on File Prior to Visit  Medication Sig Dispense Refill  . cabergoline (DOSTINEX) 0.5 MG tablet TAKE 1 TABLET TWICE A WEEK 90 tablet 0  . empagliflozin (JARDIANCE) 25 MG TABS tablet Take 25 mg by mouth  daily. 90 tablet 3  . glucose blood test strip Check blood sugar twice daily. Diagnosis DM ICD 10 - E11.9 200 each prn  . guaiFENesin-codeine 100-10 MG/5ML syrup Take 5-10 mLs by mouth every 6 (six) hours as needed for cough. 180 mL 0  . ipratropium (ATROVENT) 0.06 % nasal spray Place 2 sprays into both nostrils 4 (four) times daily. 15 mL 1  . linagliptin (TRADJENTA) 5 MG TABS tablet Take 1 tablet (5 mg total) by mouth daily. 90 tablet 3  . metFORMIN (GLUCOPHAGE) 1000 MG tablet TAKE 1 TABLET (1,000 MG TOTAL) BY MOUTH 2 (TWO) TIMES DAILY WITH A MEAL. 180 tablet 0  . ONE TOUCH ULTRA TEST test strip CHECK BLOOD SUGAR TWICE DAILY. DIAGNOSIS DM ICD 10 - E11.9 100 each 4  . pioglitazone (ACTOS) 15 MG tablet Take 1 tablet (15 mg total) by mouth daily. 90 tablet 3  . rosuvastatin (CRESTOR) 40 MG tablet Take 1 tablet (40 mg total) by mouth daily. 90 tablet 0  . diclofenac (VOLTAREN) 75 MG EC tablet Take 1 tablet (75 mg total) by mouth 2 (two) times daily. (Patient not taking: Reported on 03/07/2018) 60 tablet 3   No current facility-administered medications on file prior to visit.    No Known Allergies    Review of Systems:  Constitutional: No recent illness  HEENT: No  headache, no vision change, +?nasal drainage  Cardiac: No  chest pain, No  pressure, No palpitations  Respiratory:  No  shortness of breath. +dry cough as per HPI  Musculoskeletal:  No new myalgia/arthralgia  Neurologic: No  weakness, No  Dizziness   Exam:  BP 112/67   Pulse (!) 56   Temp 97.9 F (36.6 C) (Oral)   Wt 249 lb (112.9 kg)   BMI 33.77 kg/m    Constitutional: VS see above. General Appearance: alert, well-developed, well-nourished, NAD  Eyes: Normal lids and conjunctive, non-icteric sclera  Ears, Nose, Mouth, Throat: MMM, Normal external inspection ears/nares/mouth/lips/gums.  Neck: No masses, trachea midline.   Respiratory: Normal respiratory effort. no wheeze, no rhonchi, no rales  Cardiovascular:  S1/S2 normal, no murmur, no rub/gallop auscultated. RRR.   Musculoskeletal: Gait normal.   Neurological: Normal balance/coordination. No tremor.  Skin: warm, dry, intact.   Psychiatric: Normal judgment/insight. Normal mood and affect. Oriented x3.    Results for orders placed or performed in visit on 03/07/18 (from the past 24 hour(s))  POCT HgB A1C     Status: None   Collection Time: 03/07/18  8:12 AM  Result Value Ref Range   Hemoglobin A1C 7.5      ASSESSMENT/PLAN:   Controlled type 2 diabetes mellitus without complication, without long-term current use of insulin ([place]) - Plan: POCT HgB A1C - continue current medications, get back on track w/ diet. Emphasized diet/lifestyle modifications.   Hoarseness: stop atrovent, trial flonase + 2nd generation antihistamine    Follow-up plan: Return in about 3 months (around 06/07/2018) for medicare welcome visit, recheck A1C .  Visit summary with medication list and pertinent instructions was printed for patient to review, alert [place] if any changes needed. All questions at time of visit were answered - patient instructed to contact office with any additional concerns. ER/RTC precautions were reviewed with the patient and understanding verbalized.

## 2018-03-08 ENCOUNTER — Telehealth: Payer: Self-pay | Admitting: Osteopathic Medicine

## 2018-03-08 NOTE — Telephone Encounter (Signed)
Patient came in requesting something be called in for his cough. He was here yesterday and was told to take OTC cough medicine but it is not helping at all. He has not slept in 3 nights. Please advise. Thank you!

## 2018-03-08 NOTE — Telephone Encounter (Signed)
Forwarding to pcp for review

## 2018-03-11 ENCOUNTER — Other Ambulatory Visit: Payer: Self-pay | Admitting: Osteopathic Medicine

## 2018-03-11 DIAGNOSIS — E119 Type 2 diabetes mellitus without complications: Secondary | ICD-10-CM

## 2018-03-11 DIAGNOSIS — Z794 Long term (current) use of insulin: Principal | ICD-10-CM

## 2018-03-11 MED ORDER — GUAIFENESIN-CODEINE 100-10 MG/5ML PO SOLN: 5.0000 mL | Freq: Four times a day (QID) | ORAL | 0 refills | Status: DC | PRN

## 2018-03-11 NOTE — Telephone Encounter (Signed)
Please call patient: This message was routed to me after I had left on Friday.Apologies. Is cough still bothering him?

## 2018-03-11 NOTE — Telephone Encounter (Signed)
Left VM for Pt to return clinic call.  

## 2018-03-11 NOTE — Telephone Encounter (Signed)
Called patient - advised cough medicine has been electronically sent to pharmacy; provider notation. Patient stated he understood.

## 2018-03-11 NOTE — Telephone Encounter (Signed)
Dandra called and states he still has terrible cough. The cough is worse at night. He would like a cough syrup. He has tried Delsym OTC. Please advise. [facility].

## 2018-03-11 NOTE — Telephone Encounter (Signed)
OK will send cough medicine but if not better he will need to come get checked out!

## 2018-04-14 ENCOUNTER — Other Ambulatory Visit: Payer: Self-pay | Admitting: Osteopathic Medicine

## 2018-04-14 DIAGNOSIS — E119 Type 2 diabetes mellitus without complications: Secondary | ICD-10-CM

## 2018-05-09 DIAGNOSIS — E119 Type 2 diabetes mellitus without complications: Secondary | ICD-10-CM | POA: Diagnosis not present

## 2018-05-09 DIAGNOSIS — H527 Unspecified disorder of refraction: Secondary | ICD-10-CM | POA: Diagnosis not present

## 2018-05-09 DIAGNOSIS — Z7984 Long term (current) use of oral hypoglycemic drugs: Secondary | ICD-10-CM | POA: Diagnosis not present

## 2018-05-09 DIAGNOSIS — H2513 Age-related nuclear cataract, bilateral: Secondary | ICD-10-CM | POA: Diagnosis not present

## 2018-05-09 LAB — HM DIABETES EYE EXAM

## 2018-05-14 ENCOUNTER — Encounter: Payer: Self-pay | Admitting: Osteopathic Medicine

## 2018-05-20 ENCOUNTER — Other Ambulatory Visit: Payer: Self-pay | Admitting: Osteopathic Medicine

## 2018-05-20 DIAGNOSIS — E221 Hyperprolactinemia: Secondary | ICD-10-CM

## 2018-06-06 ENCOUNTER — Encounter: Payer: Self-pay | Admitting: Osteopathic Medicine

## 2018-06-20 ENCOUNTER — Encounter: Payer: Self-pay | Admitting: Osteopathic Medicine

## 2018-06-20 ENCOUNTER — Ambulatory Visit (INDEPENDENT_AMBULATORY_CARE_PROVIDER_SITE_OTHER): Payer: Medicare Other | Admitting: Osteopathic Medicine

## 2018-06-20 VITALS — BP 140/70 | HR 58 | Temp 98.1°F | Wt 248.8 lb

## 2018-06-20 DIAGNOSIS — Z125 Encounter for screening for malignant neoplasm of prostate: Secondary | ICD-10-CM | POA: Diagnosis not present

## 2018-06-20 DIAGNOSIS — E119 Type 2 diabetes mellitus without complications: Secondary | ICD-10-CM

## 2018-06-20 DIAGNOSIS — Z800 Family history of malignant neoplasm of digestive organs: Secondary | ICD-10-CM

## 2018-06-20 DIAGNOSIS — D352 Benign neoplasm of pituitary gland: Secondary | ICD-10-CM | POA: Diagnosis not present

## 2018-06-20 DIAGNOSIS — E782 Mixed hyperlipidemia: Secondary | ICD-10-CM | POA: Diagnosis not present

## 2018-06-20 DIAGNOSIS — Z0000 Encounter for general adult medical examination without abnormal findings: Secondary | ICD-10-CM

## 2018-06-20 DIAGNOSIS — B351 Tinea unguium: Secondary | ICD-10-CM | POA: Diagnosis not present

## 2018-06-20 DIAGNOSIS — Z1211 Encounter for screening for malignant neoplasm of colon: Secondary | ICD-10-CM | POA: Diagnosis not present

## 2018-06-20 LAB — POCT GLYCOSYLATED HEMOGLOBIN (HGB A1C): Hemoglobin A1C: 7.3 % — AB (ref 4.0–5.6)

## 2018-06-20 MED ORDER — CICLOPIROX 8 % EX SOLN: Freq: Every day | CUTANEOUS | 0 refills | Status: DC

## 2018-06-20 MED ORDER — EMPAGLIFLOZIN-LINAGLIPTIN 25-5 MG PO TABS: 1.0000 | ORAL_TABLET | Freq: Every day | ORAL | 3 refills | Status: DC

## 2018-06-20 NOTE — Patient Instructions (Addendum)
Review preventive care:   General Preventive Care  Most recent routine screening lipids/other labs: ordered today   Tobacco: don't smoke!   Alcohol: moderation is ok for most people - keep in mind alcoholic beverages are usually full of carbs!  Recreational/Illicit Drugs: don't!   Exercise: as tolerated to reduce risk of cardiovascular disease   Mental health: if need for mental health care (medicines, counseling, other), or concerns about moods, please let me know!   Sexual health: if concern about sexual health, especially if there is a need for STD testing, please let me know!   Vaccines  Flu vaccine: recommended every fall (by Halloween!)  Shingles vaccine: Shingrix recommended after age 1 - this is a newer and more effective vaccine which is a 2-shot series; many adults (you included) have already had Zostavax which is of course better than nothing!   Pneumonia vaccines: Prevnar and Pneumovax recommended after age 54, even if you've had the Pneumovax in the past for diabetes care   Tetanus booster: Tdap recommended every 10 years - you'll be due in 02/2023   Cancer screenings   Colon cancer screening: recommended at age 74, you had Cologuard done 12/2015, but given family history we should have you seen for colonoscopy   Prostate cancer screening: recommendations vary, optional PSA blood test around age 54, your PSA was normal at last check   Infection screenings . HIV and Hep C screening already done!   Other . Bone Density Test: recommended for women at age 56, men at age 12 . Advanced Directive: Living Will and/or Healthcare Power of Attorney recommended for everyone, regardless of age or health . Cholesterol: recommended screening annually . Thyroid and Vitamin D: routine screening not recommended, most insurance (and Medicare!) will not cover this testing unless absolutely needed      Medications approved for long-term use for obesity  Qsymia (Phentermine  and Topiramate)  Saxenda (Liraglutide 3 mg/day)  Contrave (Bupropion and Naltrexone)  Lorcaserin (Belviq or Belviq XR)  Orlistat (Xenical, Alli)  Bupropion (Wellbutrin) I recommend that you research the above medications and see which one(s) your insurance may or may not cover: If you call your insurance, ask them specifically what medications are on their formulary that are approved for obesity treatment. They should be able to send you a list or tell you over the phone. Most of these drug companies will also have savings programs/coupons, see brand web-sites for details on such programs. Remember, medications aren't magic! You MUST be diligent about lifestyle changes as well!

## 2018-06-20 NOTE — Progress Notes (Signed)
Subjective:   William Webster is a 67 y.o. male who presents for a Welcome to Medicare exam.   Other medical issues addressed today:  DM2:  Would like to D/C Tradjenta and Jardiance and get back on Glyxambi combination, only reason he stopped this was d/t cost. A1C today was above goal.   Review of Systems: 12 point ROS no concerns other than weight gain       Objective:    There were no vitals filed for this visit. There is no height or weight on file to calculate BMI.  Medications Outpatient Encounter Medications as of 06/20/2018  Medication Sig  . cabergoline (DOSTINEX) 0.5 MG tablet TAKE 1 TABLET TWICE A WEEK  . empagliflozin (JARDIANCE) 25 MG TABS tablet Take 25 mg by mouth daily.  Marland Kitchen glucose blood test strip Check blood sugar twice daily. Diagnosis DM ICD 10 - E11.9  . guaiFENesin-codeine 100-10 MG/5ML syrup Take 5-10 mLs by mouth every 6 (six) hours as needed for cough.  Marland Kitchen ipratropium (ATROVENT) 0.06 % nasal spray Place 2 sprays into both nostrils 4 (four) times daily.  Marland Kitchen linagliptin (TRADJENTA) 5 MG TABS tablet Take 1 tablet (5 mg total) by mouth daily.  . metFORMIN (GLUCOPHAGE) 1000 MG tablet TAKE 1 TABLET (1,000 MG TOTAL) BY MOUTH 2 (TWO) TIMES DAILY WITH A MEAL.  Marland Kitchen ONE TOUCH ULTRA TEST test strip CHECK BLOOD SUGAR TWICE DAILY. DIAGNOSIS DM ICD 10 - E11.9  . pioglitazone (ACTOS) 15 MG tablet TAKE 1 TABLET (15 MG TOTAL) BY MOUTH DAILY.  . rosuvastatin (CRESTOR) 40 MG tablet Take 1 tablet (40 mg total) by mouth daily.   No facility-administered encounter medications on file as of 06/20/2018.      History: Past Medical History:  Diagnosis Date  . Anterior pituitary adenoma syndrome ([place])   . Anxiety   . Bimalleolar fracture of right ankle   . Colon polyp   . Diabetes mellitus without complication ([place])   . Diabetic neuropathy ([place])   . Family history of cancer of GI tract   . Family history of colon cancer   . Gilbert disease   . Hyperlipidemia   . Low back  pain   . Lumbar scoliosis   . Prolactinoma ([place])   . Sciatica of right side   . Spondylosis of lumbar joint    Past Surgical History:  Procedure Laterality Date  . HEMORRHOID SURGERY    . ORIF ANKLE FRACTURE Right 11/24/2015   Procedure: OPEN REDUCTION INTERNAL FIXATION (ORIF) ANKLE FRACTURE;  Surgeon: Dorna Leitz, MD;  Location: [facility];  Service: Orthopedics;  Laterality: Right;  Open reduction internal fixation right ankle fracture  . TONSILLECTOMY      Family History  Problem Relation Age of Onset  . Cancer Mother        colon  . Heart failure Mother   . Diabetes Mother   . Hyperlipidemia Mother   . Stroke Mother   . Stroke Father   . Hyperlipidemia Father   . Diabetes Maternal Grandfather   . Heart disease Maternal Grandfather   . Stroke Maternal Grandfather   . Diabetes Paternal Grandfather   . Heart disease Paternal Grandfather   . Stroke Paternal Grandfather    Social History   Occupational History  . Not on file  Tobacco Use  . Smoking status: Current Some Day Smoker    Types: Cigars  . Smokeless tobacco: Never Used  . Tobacco comment: occassional cigar  Substance and Sexual Activity  .  Alcohol use: Yes    Alcohol/week: 6.0 oz    Types: 10 Standard drinks or equivalent per week    Comment: social 12-20 drinks a week  . Drug use: No  . Sexual activity: Yes    Birth control/protection: None   Tobacco Counseling Ready to quit: Not Answered Counseling given: Not Answered Comment: occassional cigar   Immunizations and Health Maintenance Immunization History  Administered Date(s) Administered  . Influenza,inj,Quad PF,6+ Mos 08/23/2015  . Influenza-Unspecified 09/07/2016, 10/08/2017  . Pneumococcal Polysaccharide-23 01/12/2016  . Tdap 03/17/2013  . Zoster 08/23/2015   Health Maintenance Due  Topic Date Due  . URINE MICROALBUMIN  01/04/2018  . PNA vac Low Risk Adult (1 of 2 - PCV13) 05/12/2018    Activities of Daily Living No  concerns on flow sheet  Physical Exam No abnormal findings   Advanced Directives:      Assessment:    This is a routine wellness  examination for this patient . No abnormal findings   Vision/Hearing screen  Dietary issues and exercise activities discussed:     Goals    None      Depression Screen PHQ 2/9 Scores 06/20/2018 11/05/2017 07/05/2017  PHQ - 2 Score 0 0 0  PHQ- 9 Score 0 - -     Fall Risk: none   Cognitive Function normal        Patient Care Team: Emeterio Reeve, DO as PCP - General (Osteopathic Medicine)     Plan:     Encounter for Medicare annual wellness exam  Diabetes mellitus without complication ([place]) - Plan: POCT HgB Y3K, Basic Metabolic Panel with GFR, Hepatic function panel, Lipid panel  Pituitary adenoma ([place])  Mixed hyperlipidemia - Plan: Basic Metabolic Panel with GFR, Hepatic function panel, Lipid panel  Screening for prostate cancer - Plan: PSA  Screening for colon cancer    I have personally reviewed and noted the following in the patient's chart:   . Medical and social history . Use of alcohol, tobacco or illicit drugs  . Current medications and supplements . Functional ability and status . Nutritional status . Physical activity . Advanced directives . List of other physicians . Hospitalizations, surgeries, and ER visits in previous 12 months . Vitals . Screenings to include cognitive, depression, and falls . Referrals and appointments  Patient Instructions  Review preventive care:   General Preventive Care  Most recent routine screening lipids/other labs: ordered today   Tobacco: don't smoke!   Alcohol: moderation is ok for most people - keep in mind alcoholic beverages are usually full of carbs!  Recreational/Illicit Drugs: don't!   Exercise: as tolerated to reduce risk of cardiovascular disease   Mental health: if need for mental health care (medicines, counseling, other), or concerns about moods,  please let me know!   Sexual health: if concern about sexual health, especially if there is a need for STD testing, please let me know!   Vaccines  Flu vaccine: recommended every fall (by Halloween!)  Shingles vaccine: Shingrix recommended after age 3 - this is a newer and more effective vaccine which is a 2-shot series; many adults (you included) have already had Zostavax which is of course better than nothing!   Pneumonia vaccines: Prevnar and Pneumovax recommended after age 11, even if you've had the Pneumovax in the past for diabetes care   Tetanus booster: Tdap recommended every 10 years - you'll be due in 02/2023   Cancer screenings   Colon cancer screening: recommended at age  5, you had Cologuard done 12/2015, but given family history we should have you seen for colonoscopy   Prostate cancer screening: recommendations vary, optional PSA blood test around age 65, your PSA was normal at last check   Infection screenings . HIV and Hep C screening already done!   Other . Bone Density Test: recommended for women at age 42, men at age 46 . Advanced Directive: Living Will and/or Healthcare Power of Attorney recommended for everyone, regardless of age or health . Cholesterol: recommended screening annually . Thyroid and Vitamin D: routine screening not recommended, most insurance (and Medicare!) will not cover this testing unless absolutely needed      Medications approved for long-term use for obesity  Qsymia (Phentermine and Topiramate)  Saxenda (Liraglutide 3 mg/day)  Contrave (Bupropion and Naltrexone)  Lorcaserin (Belviq or Belviq XR)  Orlistat (Xenical, Alli)  Bupropion (Wellbutrin) I recommend that you research the above medications and see which one(s) your insurance may or may not cover: If you call your insurance, ask them specifically what medications are on their formulary that are approved for obesity treatment. They should be able to send you a list or tell  you over the phone. Most of these drug companies will also have savings programs/coupons, see brand web-sites for details on such programs. Remember, medications aren't magic! You MUST be diligent about lifestyle changes as well!            In addition, I have reviewed and discussed with patient certain preventive protocols, quality metrics, and best practice recommendations. A written personalized care plan for preventive services as well as general preventive health recommendations were provided to patient.    Emeterio Reeve, DO 06/20/2018

## 2018-06-21 LAB — COMPLETE METABOLIC PANEL WITH GFR
AG Ratio: 2 (calc) (ref 1.0–2.5)
ALT: 20 U/L (ref 9–46)
AST: 18 U/L (ref 10–35)
Albumin: 4.5 g/dL (ref 3.6–5.1)
Alkaline phosphatase (APISO): 49 U/L (ref 40–115)
BUN: 20 mg/dL (ref 7–25)
CALCIUM: 9.5 mg/dL (ref 8.6–10.3)
CHLORIDE: 104 mmol/L (ref 98–110)
CO2: 27 mmol/L (ref 20–32)
CREATININE: 0.99 mg/dL (ref 0.70–1.25)
GFR, EST NON AFRICAN AMERICAN: 80 mL/min/{1.73_m2} (ref >=60)
GFR, Est African American: 92 mL/min/{1.73_m2} (ref >=60)
GLUCOSE: 143 mg/dL — AB (ref 65–99)
Globulin: 2.3 g/dL (calc) (ref 1.9–3.7)
Potassium: 4.7 mmol/L (ref 3.5–5.3)
Sodium: 140 mmol/L (ref 135–146)
Total Bilirubin: 1.8 mg/dL — ABNORMAL HIGH (ref 0.2–1.2)
Total Protein: 6.8 g/dL (ref 6.1–8.1)

## 2018-06-21 LAB — URINALYSIS, ROUTINE W REFLEX MICROSCOPIC
Bilirubin Urine: NEGATIVE
Hgb urine dipstick: NEGATIVE
Ketones, ur: NEGATIVE
LEUKOCYTES UA: NEGATIVE
Nitrite: NEGATIVE
Protein, ur: NEGATIVE
Specific Gravity, Urine: 1.038 — ABNORMAL HIGH (ref 1.001–1.03)

## 2018-06-21 LAB — CBC
HEMATOCRIT: 44.1 % (ref 38.5–50.0)
Hemoglobin: 15 g/dL (ref 13.2–17.1)
MCH: 29 pg (ref 27.0–33.0)
MCHC: 34 g/dL (ref 32.0–36.0)
MCV: 85.1 fL (ref 80.0–100.0)
MPV: 9.9 fL (ref 7.5–12.5)
Platelets: 121 10*3/uL — ABNORMAL LOW (ref 140–400)
RBC: 5.18 10*6/uL (ref 4.20–5.80)
RDW: 13.5 % (ref 11.0–15.0)
WBC: 6.7 10*3/uL (ref 3.8–10.8)

## 2018-06-21 LAB — LIPID PANEL
CHOL/HDL RATIO: 2.2 (calc) (ref <5.0)
Cholesterol: 133 mg/dL (ref <200)
HDL: 61 mg/dL (ref >40)
LDL CHOLESTEROL (CALC): 53 mg/dL
NON-HDL CHOLESTEROL (CALC): 72 mg/dL (ref <130)
TRIGLYCERIDES: 106 mg/dL (ref <150)

## 2018-06-21 LAB — MICROALBUMIN / CREATININE URINE RATIO
Creatinine, Urine: 65 mg/dL (ref 20–320)
MICROALB/CREAT RATIO: 38 ug/mg{creat} — AB (ref <30)
Microalb, Ur: 2.5 mg/dL

## 2018-06-21 LAB — PSA, TOTAL WITH REFLEX TO PSA, FREE: PSA, Total: 2.5 ng/mL (ref <=4.0)

## 2018-07-13 ENCOUNTER — Other Ambulatory Visit: Payer: Self-pay | Admitting: Osteopathic Medicine

## 2018-07-13 DIAGNOSIS — E119 Type 2 diabetes mellitus without complications: Secondary | ICD-10-CM

## 2018-07-20 DIAGNOSIS — E785 Hyperlipidemia, unspecified: Secondary | ICD-10-CM | POA: Diagnosis not present

## 2018-07-20 DIAGNOSIS — I10 Essential (primary) hypertension: Secondary | ICD-10-CM | POA: Diagnosis not present

## 2018-07-20 DIAGNOSIS — S0990XA Unspecified injury of head, initial encounter: Secondary | ICD-10-CM | POA: Diagnosis not present

## 2018-07-20 DIAGNOSIS — G9389 Other specified disorders of brain: Secondary | ICD-10-CM | POA: Diagnosis not present

## 2018-07-20 DIAGNOSIS — R220 Localized swelling, mass and lump, head: Secondary | ICD-10-CM | POA: Diagnosis not present

## 2018-07-20 DIAGNOSIS — S199XXA Unspecified injury of neck, initial encounter: Secondary | ICD-10-CM | POA: Diagnosis not present

## 2018-07-20 DIAGNOSIS — S060X1A Concussion with loss of consciousness of 30 minutes or less, initial encounter: Secondary | ICD-10-CM | POA: Diagnosis not present

## 2018-07-20 DIAGNOSIS — E119 Type 2 diabetes mellitus without complications: Secondary | ICD-10-CM | POA: Diagnosis not present

## 2018-07-20 DIAGNOSIS — S4992XA Unspecified injury of left shoulder and upper arm, initial encounter: Secondary | ICD-10-CM | POA: Diagnosis not present

## 2018-07-20 DIAGNOSIS — Z7984 Long term (current) use of oral hypoglycemic drugs: Secondary | ICD-10-CM | POA: Diagnosis not present

## 2018-07-20 DIAGNOSIS — Z79899 Other long term (current) drug therapy: Secondary | ICD-10-CM | POA: Diagnosis not present

## 2018-07-20 DIAGNOSIS — S161XXA Strain of muscle, fascia and tendon at neck level, initial encounter: Secondary | ICD-10-CM | POA: Diagnosis not present

## 2018-07-20 DIAGNOSIS — F17290 Nicotine dependence, other tobacco product, uncomplicated: Secondary | ICD-10-CM | POA: Diagnosis not present

## 2018-07-20 DIAGNOSIS — S42035A Nondisplaced fracture of lateral end of left clavicle, initial encounter for closed fracture: Secondary | ICD-10-CM | POA: Diagnosis not present

## 2018-08-01 ENCOUNTER — Ambulatory Visit (INDEPENDENT_AMBULATORY_CARE_PROVIDER_SITE_OTHER): Payer: Medicare Other | Admitting: Sports Medicine

## 2018-08-01 ENCOUNTER — Encounter: Payer: Self-pay | Admitting: Sports Medicine

## 2018-08-01 DIAGNOSIS — S42002A Fracture of unspecified part of left clavicle, initial encounter for closed fracture: Secondary | ICD-10-CM | POA: Insufficient documentation

## 2018-08-01 DIAGNOSIS — S42035A Nondisplaced fracture of lateral end of left clavicle, initial encounter for closed fracture: Secondary | ICD-10-CM | POA: Diagnosis not present

## 2018-08-01 NOTE — Progress Notes (Signed)
Subjective:    CC: Injury  HPI: A week and a half ago this pleasant 67 year old male slipped and fell at the pool, he hit his head, as well as shoulder.  He had a loss of consciousness, woke up in the emergency department.  CT head and cervical spine were negative.  He did have what looked like a small fracture, nondisplaced through the distal end of the left clavicle.  Overall he is doing significantly better, only minimal headaches, no visual changes, no focal neurologic symptoms.  Symptoms are mild, improving.  I reviewed the past medical history, family history, social history, surgical history, and allergies today and no changes were needed.  Please see the problem list section below in epic for further details.  Past Medical History: Past Medical History:  Diagnosis Date  . Anterior pituitary adenoma syndrome ([place])   . Anxiety   . Bimalleolar fracture of right ankle   . Colon polyp   . Diabetes mellitus without complication ([place])   . Diabetic neuropathy ([place])   . Family history of cancer of GI tract   . Family history of colon cancer   . Gilbert disease   . Hyperlipidemia   . Low back pain   . Lumbar scoliosis   . Prolactinoma ([place])   . Sciatica of right side   . Spondylosis of lumbar joint    Past Surgical History: Past Surgical History:  Procedure Laterality Date  . HEMORRHOID SURGERY    . ORIF ANKLE FRACTURE Right 11/24/2015   Procedure: OPEN REDUCTION INTERNAL FIXATION (ORIF) ANKLE FRACTURE;  Surgeon: Dorna Leitz, MD;  Location: [facility];  Service: Orthopedics;  Laterality: Right;  Open reduction internal fixation right ankle fracture  . TONSILLECTOMY     Social History: Social History   Socioeconomic History  . Marital status: Married    Spouse name: Not on file  . Number of children: Not on file  . Years of education: Not on file  . Highest education level: Not on file  Occupational History  . Not on file  Social Needs  . Financial  resource strain: Not on file  . Food insecurity:    Worry: Not on file    Inability: Not on file  . Transportation needs:    Medical: Not on file    Non-medical: Not on file  Tobacco Use  . Smoking status: Current Some Day Smoker    Types: Cigars  . Smokeless tobacco: Never Used  . Tobacco comment: occassional cigar  Substance and Sexual Activity  . Alcohol use: Yes    Alcohol/week: 10.0 standard drinks    Types: 10 Standard drinks or equivalent per week    Comment: social 12-20 drinks a week  . Drug use: No  . Sexual activity: Yes    Birth control/protection: None  Lifestyle  . Physical activity:    Days per week: Not on file    Minutes per session: Not on file  . Stress: Not on file  Relationships  . Social connections:    Talks on phone: Not on file    Gets together: Not on file    Attends religious service: Not on file    Active member of club or organization: Not on file    Attends meetings of clubs or organizations: Not on file    Relationship status: Not on file  Other Topics Concern  . Not on file  Social History Narrative  . Not on file   Family History: Family  History  Problem Relation Age of Onset  . Cancer Mother        colon  . Heart failure Mother   . Diabetes Mother   . Hyperlipidemia Mother   . Stroke Mother   . Stroke Father   . Hyperlipidemia Father   . Diabetes Maternal Grandfather   . Heart disease Maternal Grandfather   . Stroke Maternal Grandfather   . Diabetes Paternal Grandfather   . Heart disease Paternal Grandfather   . Stroke Paternal Grandfather    Allergies: No Known Allergies Medications: See med rec.  Review of Systems: No fevers, chills, night sweats, weight loss, chest pain, or shortness of breath.   Objective:    General: Well Developed, well nourished, and in no acute distress.  Neuro: Alert and oriented x3, extra-ocular muscles intact, sensation grossly intact.  Cranial nerves II through XII are intact, motor,  sensory, and coordinative functions are all intact. HEENT: Normocephalic, atraumatic, pupils equal round reactive to light, neck supple, no masses, no lymphadenopathy, thyroid nonpalpable.  Skin: Warm and dry, no rashes. Cardiac: Regular rate and rhythm, no murmurs rubs or gallops, no lower extremity edema.  Respiratory: Clear to auscultation bilaterally. Not using accessory muscles, speaking in full sentences. Left shoulder: Inspection reveals no abnormalities, atrophy or asymmetry. Palpation is normal with no tenderness over AC joint or bicipital groove. Only mild tenderness over the distal clavicle, proximal to the acromioclavicular joint. ROM is full in all planes. Rotator cuff strength normal throughout. No signs of impingement with negative Neer and Hawkin's tests, empty can. Speeds and Yergason's tests normal. No labral pathology noted with negative Obrien's, negative crank, negative clunk, and good stability. Normal scapular function observed. No painful arc and no drop arm sign. No apprehension sign  Impression and Recommendations:    Fracture of clavicle, left, closed Nondisplaced fracture at the distal clavicle, good motion, good strength. He will wear a sling at home, no restrictions other than 10 pound lifting restriction. Return to see me in 6 weeks. He did have a head and neck CT in the emergency department that was negative.  I billed a fracture code for this encounter, all subsequent visits will be post-op checks in the global period.  ___________________________________________ Gwen Her. Dianah Field, M.D., ABFM., CAQSM. Primary Care and [place] Instructor of [place] of [facility] of Medicine

## 2018-08-01 NOTE — Assessment & Plan Note (Signed)
Nondisplaced fracture at the distal clavicle, good motion, good strength. He will wear a sling at home, no restrictions other than 10 pound lifting restriction. Return to see me in 6 weeks. He did have a head and neck CT in the emergency department that was negative.  I billed a fracture code for this encounter, all subsequent visits will be post-op checks in the global period.

## 2018-09-16 ENCOUNTER — Encounter: Payer: Self-pay | Admitting: Sports Medicine

## 2018-09-16 ENCOUNTER — Ambulatory Visit (INDEPENDENT_AMBULATORY_CARE_PROVIDER_SITE_OTHER): Payer: Medicare Other | Admitting: Sports Medicine

## 2018-09-16 ENCOUNTER — Ambulatory Visit (INDEPENDENT_AMBULATORY_CARE_PROVIDER_SITE_OTHER): Payer: Medicare Other

## 2018-09-16 VITALS — BP 117/70 | HR 62 | Ht 72.0 in | Wt 249.0 lb

## 2018-09-16 DIAGNOSIS — G8929 Other chronic pain: Secondary | ICD-10-CM | POA: Diagnosis not present

## 2018-09-16 DIAGNOSIS — Z23 Encounter for immunization: Secondary | ICD-10-CM | POA: Diagnosis not present

## 2018-09-16 DIAGNOSIS — M25511 Pain in right shoulder: Secondary | ICD-10-CM

## 2018-09-16 DIAGNOSIS — M25512 Pain in left shoulder: Secondary | ICD-10-CM | POA: Diagnosis not present

## 2018-09-16 DIAGNOSIS — M47816 Spondylosis without myelopathy or radiculopathy, lumbar region: Secondary | ICD-10-CM

## 2018-09-16 DIAGNOSIS — M19012 Primary osteoarthritis, left shoulder: Secondary | ICD-10-CM | POA: Diagnosis not present

## 2018-09-16 MED ORDER — MAGNESIUM OXIDE 400 MG PO TABS: 800.0000 mg | ORAL_TABLET | Freq: Every day | ORAL | 3 refills | Status: DC

## 2018-09-16 NOTE — Assessment & Plan Note (Signed)
Bilateral cramps likely related to spinal stenosis. Adding magnesium oxide at bedtime. If insufficient relief at the follow-up visit we will proceed with further intervention into the lumbar spine.

## 2018-09-16 NOTE — Progress Notes (Signed)
Subjective:    CC: Recheck shoulders  HPI: Jacorian returns, this visit was supposed to be for his shoulders, he did want to bring up bilateral cramps in both legs, worse at night.  He does have known lumbar spondylosis.  Symptoms are moderate, present, no bowel or bladder dysfunction, saddle numbness, constitutional symptoms.  Bilateral shoulder pain: History of suspected left distal clavicular fracture, this is healed, pain over the deltoid, worse with overhead activities, left worse than right.  No new trauma.  Symptoms are moderate, persistent.  I reviewed the past medical history, family history, social history, surgical history, and allergies today and no changes were needed.  Please see the problem list section below in epic for further details.  Past Medical History: Past Medical History:  Diagnosis Date  . Anterior pituitary adenoma syndrome ([place])   . Anxiety   . Bimalleolar fracture of right ankle   . Colon polyp   . Diabetes mellitus without complication ([place])   . Diabetic neuropathy ([place])   . Family history of cancer of GI tract   . Family history of colon cancer   . Gilbert disease   . Hyperlipidemia   . Low back pain   . Lumbar scoliosis   . Prolactinoma ([place])   . Sciatica of right side   . Spondylosis of lumbar joint    Past Surgical History: Past Surgical History:  Procedure Laterality Date  . HEMORRHOID SURGERY    . ORIF ANKLE FRACTURE Right 11/24/2015   Procedure: OPEN REDUCTION INTERNAL FIXATION (ORIF) ANKLE FRACTURE;  Surgeon: Dorna Leitz, MD;  Location: [facility];  Service: Orthopedics;  Laterality: Right;  Open reduction internal fixation right ankle fracture  . TONSILLECTOMY     Social History: Social History   Socioeconomic History  . Marital status: Married    Spouse name: Not on file  . Number of children: Not on file  . Years of education: Not on file  . Highest education level: Not on file  Occupational History  . Not on  file  Social Needs  . Financial resource strain: Not on file  . Food insecurity:    Worry: Not on file    Inability: Not on file  . Transportation needs:    Medical: Not on file    Non-medical: Not on file  Tobacco Use  . Smoking status: Current Some Day Smoker    Types: Cigars  . Smokeless tobacco: Never Used  . Tobacco comment: occassional cigar  Substance and Sexual Activity  . Alcohol use: Yes    Alcohol/week: 10.0 standard drinks    Types: 10 Standard drinks or equivalent per week    Comment: social 12-20 drinks a week  . Drug use: No  . Sexual activity: Yes    Birth control/protection: None  Lifestyle  . Physical activity:    Days per week: Not on file    Minutes per session: Not on file  . Stress: Not on file  Relationships  . Social connections:    Talks on phone: Not on file    Gets together: Not on file    Attends religious service: Not on file    Active member of club or organization: Not on file    Attends meetings of clubs or organizations: Not on file    Relationship status: Not on file  Other Topics Concern  . Not on file  Social History Narrative  . Not on file   Family History: Family History  Problem Relation  Age of Onset  . Cancer Mother        colon  . Heart failure Mother   . Diabetes Mother   . Hyperlipidemia Mother   . Stroke Mother   . Stroke Father   . Hyperlipidemia Father   . Diabetes Maternal Grandfather   . Heart disease Maternal Grandfather   . Stroke Maternal Grandfather   . Diabetes Paternal Grandfather   . Heart disease Paternal Grandfather   . Stroke Paternal Grandfather    Allergies: No Known Allergies Medications: See med rec.  Review of Systems: No fevers, chills, night sweats, weight loss, chest pain, or shortness of breath.   Objective:    General: Well Developed, well nourished, and in no acute distress.  Neuro: Alert and oriented x3, extra-ocular muscles intact, sensation grossly intact.  HEENT:  Normocephalic, atraumatic, pupils equal round reactive to light, neck supple, no masses, no lymphadenopathy, thyroid nonpalpable.  Skin: Warm and dry, no rashes. Cardiac: Regular rate and rhythm, no murmurs rubs or gallops, no lower extremity edema.  Respiratory: Clear to auscultation bilaterally. Not using accessory muscles, speaking in full sentences. Bilateral shoulders: Inspection reveals no abnormalities, atrophy or asymmetry. Palpation is normal with no tenderness over AC joint or bicipital groove. ROM is full in all planes. Rotator cuff strength normal throughout. Positive Neer and Hawkin's tests, empty can. Speeds and Yergason's tests mildly positive. No labral pathology noted with negative Obrien's, negative crank, negative clunk, and good stability. Normal scapular function observed. No painful arc and no drop arm sign. No apprehension sign  Impression and Recommendations:    Bilateral shoulder pain Bilateral impingement syndrome. Home rehab exercises given, bilateral x-rays. I do not think this is related to his old distal clavicular fracture. Return to see me in 4 weeks, bilateral subacromial injections if not better.  Spondylosis of lumbar region without myelopathy or radiculopathy Bilateral cramps likely related to spinal stenosis. Adding magnesium oxide at bedtime. If insufficient relief at the follow-up visit we will proceed with further intervention into the lumbar spine. ___________________________________________ Gwen Her. Dianah Field, M.D., ABFM., CAQSM. Primary Care and [place] Instructor of [place] of [facility] of Medicine

## 2018-09-16 NOTE — Assessment & Plan Note (Signed)
Bilateral impingement syndrome. Home rehab exercises given, bilateral x-rays. I do not think this is related to his old distal clavicular fracture. Return to see me in 4 weeks, bilateral subacromial injections if not better.

## 2018-09-19 ENCOUNTER — Ambulatory Visit: Payer: Self-pay | Admitting: Osteopathic Medicine

## 2018-09-23 ENCOUNTER — Encounter: Payer: Self-pay | Admitting: Osteopathic Medicine

## 2018-09-24 ENCOUNTER — Ambulatory Visit: Payer: Self-pay | Admitting: Osteopathic Medicine

## 2018-10-12 IMAGING — CT CT RENAL STONE PROTOCOL
2 of 4 series · 17 of 46 positions shown, 19 images · non-contrast
Comparison: 07/15/2015

CLINICAL DATA: Left-sided flank pain for several days with
hematuria, initial encounter

EXAM:
CT ABDOMEN AND PELVIS WITHOUT CONTRAST
TECHNIQUE: Multidetector CT imaging of the abdomen and pelvis was performed
following the standard protocol without IV contrast.

[Series 2: axial st · axial · 0.96mm/px · z∈[+786,+1236]mm · 14 of 100 slices shown, 16 images]
[im 5/100  soft-tissue]
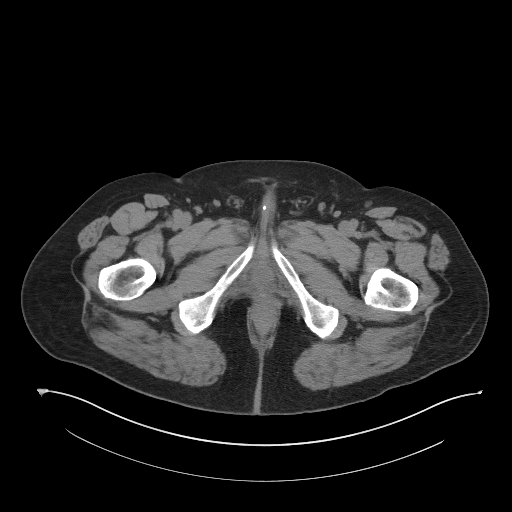
[im 5/100  bone]
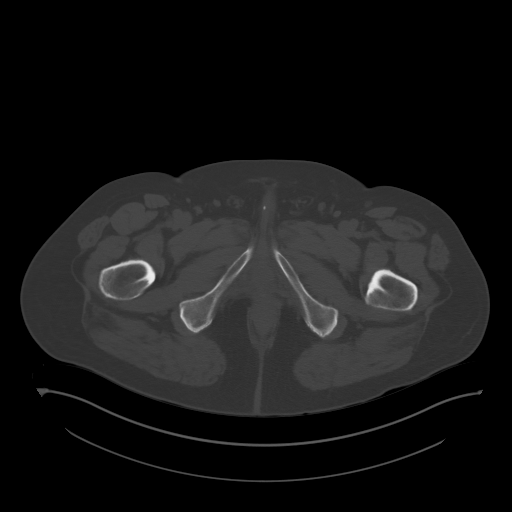
[im 13/100  soft-tissue]
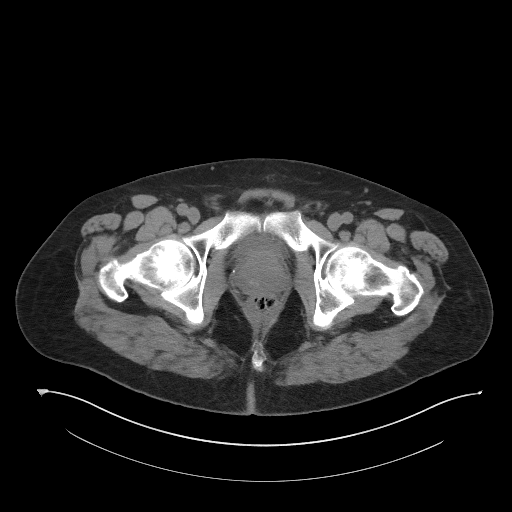
[im 21/100  soft-tissue]
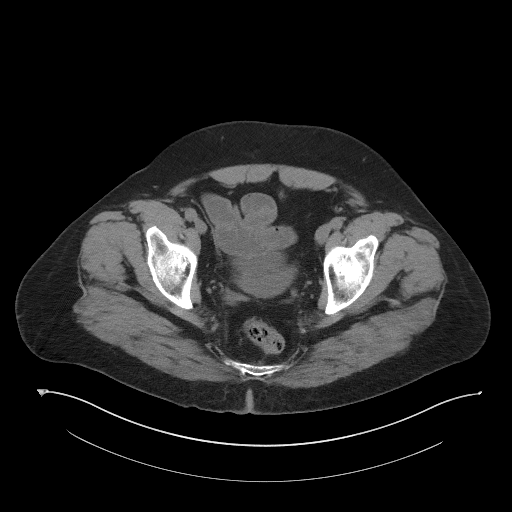
[im 25/100  soft-tissue]
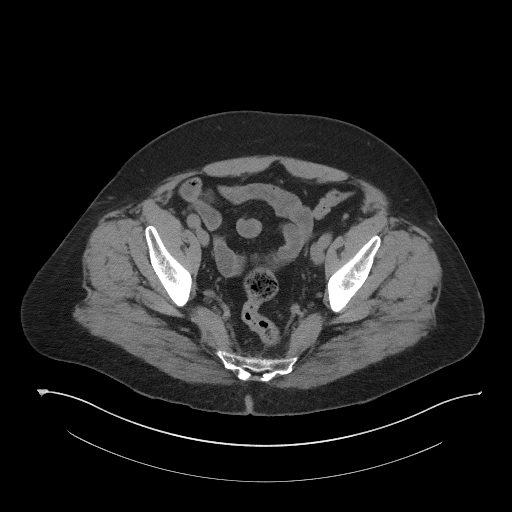
[im 34/100  soft-tissue]
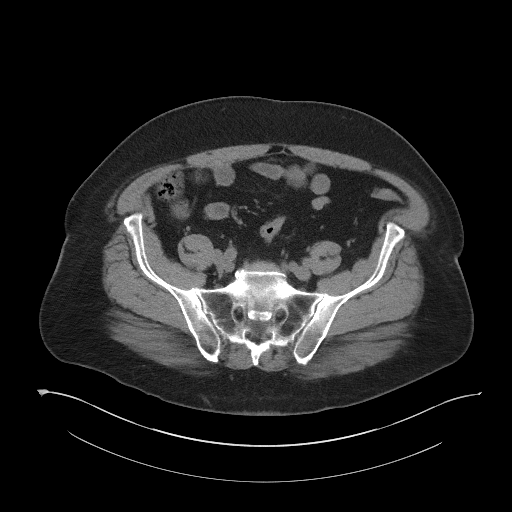
[im 42/100  soft-tissue]
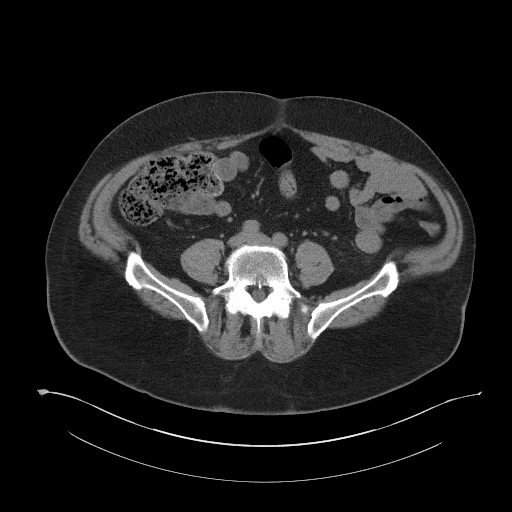
[im 46/100  soft-tissue]
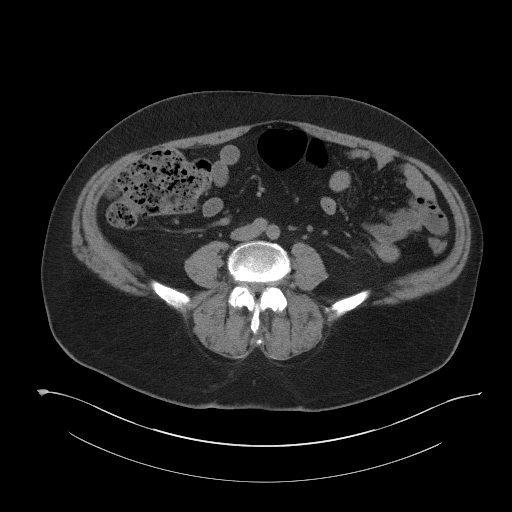
[im 54/100  soft-tissue]
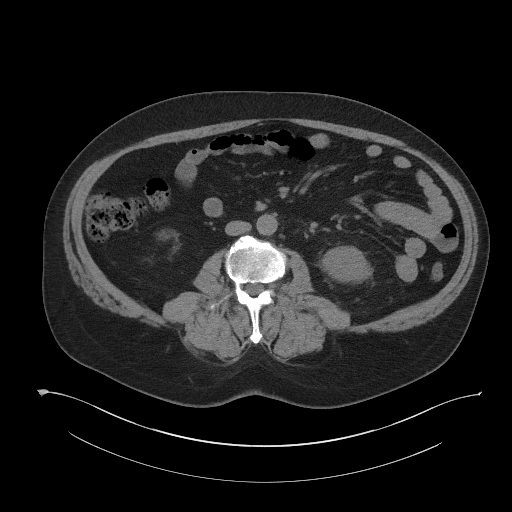
[im 58/100  soft-tissue]
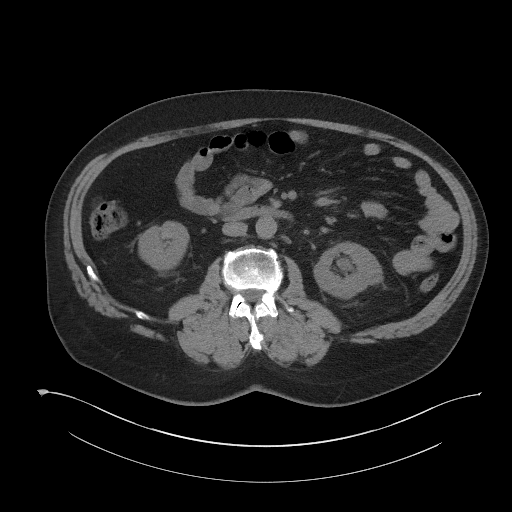
[im 58/100  bone]
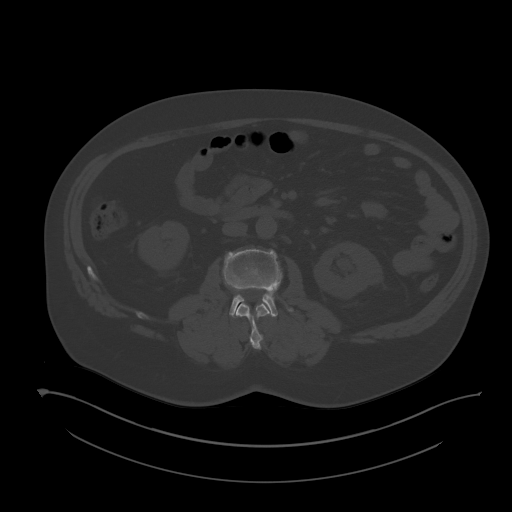
[im 67/100  soft-tissue]
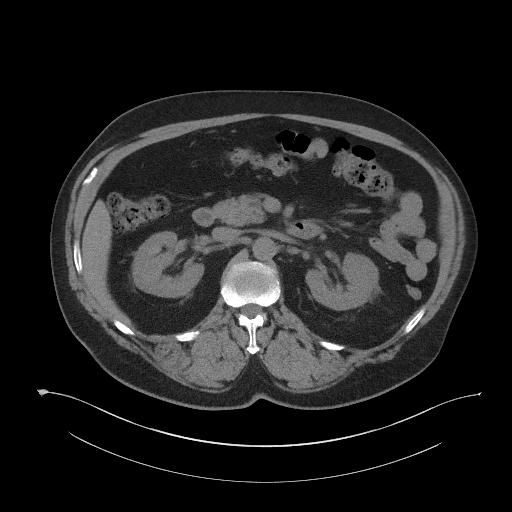
[im 75/100  soft-tissue]
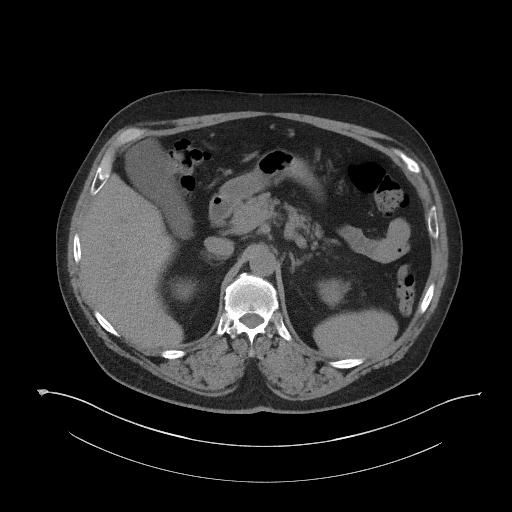
[im 79/100  soft-tissue]
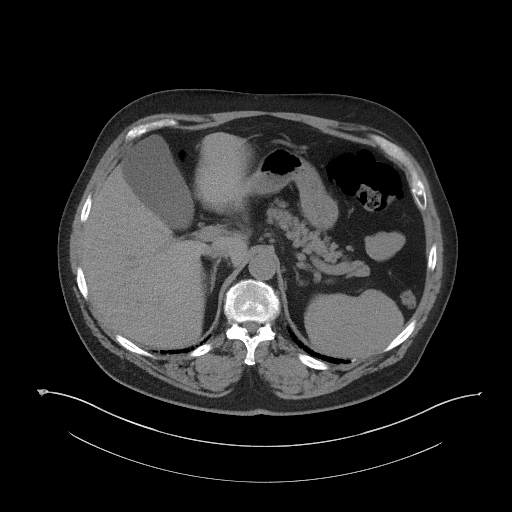
[im 87/100  soft-tissue]
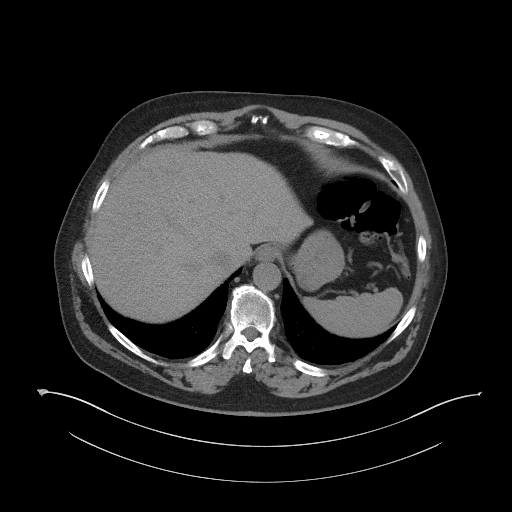
[im 95/100  soft-tissue]
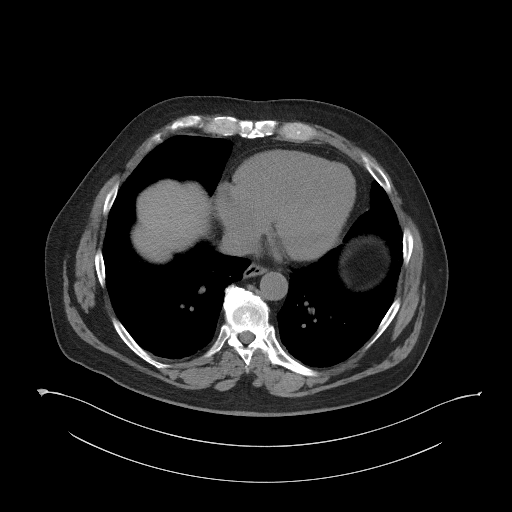

[Series 4: coronal st · coronal · 0.93mm/px · 3 of 115 slices shown]
[im 39/115  soft-tissue]
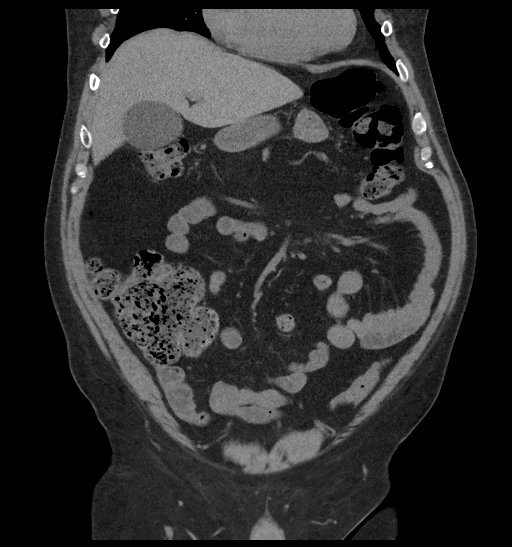
[im 51/115  soft-tissue]
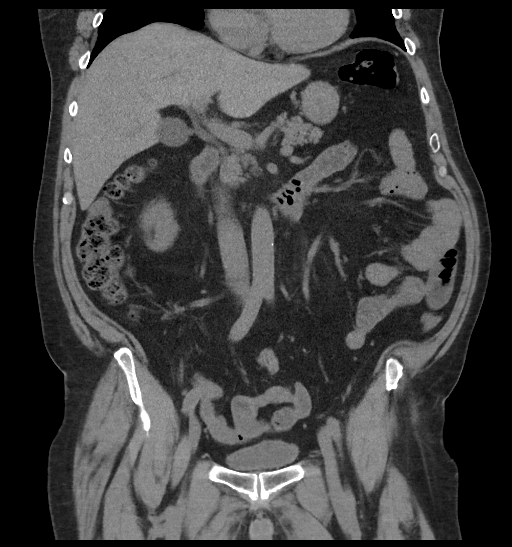
[im 64/115  soft-tissue]
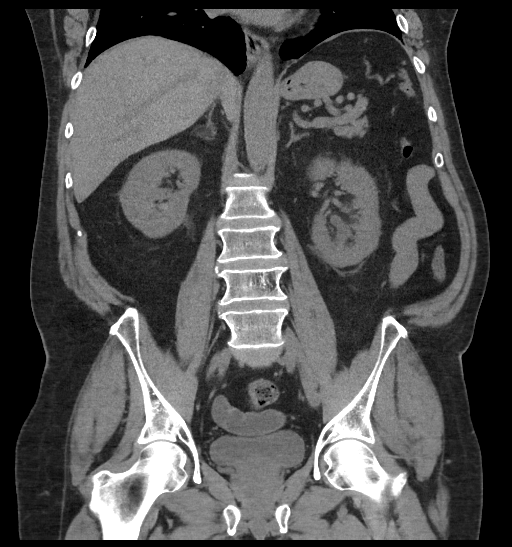

[17 of 46 positions shown; findings below may reference images not displayed]

FINDINGS: Lower chest: Within normal limits.

Hepatobiliary: No focal liver abnormality is seen. No gallstones,
gallbladder wall thickening, or biliary dilatation.

Pancreas: Unremarkable. No pancreatic ductal dilatation or
surrounding inflammatory changes.

Spleen: Normal in size without focal abnormality.

Adrenals/Urinary Tract: The adrenal glands are within normal limits
bilaterally. The right kidney and collecting system are
unremarkable. The bladder is partially distended. The left kidney
shows evidence of a renal pelvic calculus causing mild obstructive
changes. This measures approximately 13 mm in greatest dimension. No
other renal calculi are seen.

Stomach/Bowel: The appendix is not well visualized although no
inflammatory changes are seen. No obstructive or inflammatory
changes are noted within the bowel.

Vascular/Lymphatic: Aortic atherosclerosis. No enlarged abdominal or
pelvic lymph nodes.

Reproductive: Prostate is unremarkable.

Other: No abdominal wall hernia or abnormality. No abdominopelvic
ascites.

Musculoskeletal: Degenerative changes of the lumbar spine are seen.
Hemangioma is noted within the L4 vertebral body on the left.
IMPRESSION: 13 mm left renal pelvic stone with mild obstructive change.

Nonvisualization of the appendix although no inflammatory changes
are noted.

## 2018-10-14 ENCOUNTER — Ambulatory Visit (INDEPENDENT_AMBULATORY_CARE_PROVIDER_SITE_OTHER): Payer: Medicare Other | Admitting: Osteopathic Medicine

## 2018-10-14 ENCOUNTER — Ambulatory Visit (INDEPENDENT_AMBULATORY_CARE_PROVIDER_SITE_OTHER): Payer: Medicare Other | Admitting: Sports Medicine

## 2018-10-14 ENCOUNTER — Encounter: Payer: Self-pay | Admitting: Osteopathic Medicine

## 2018-10-14 VITALS — BP 112/66 | HR 60 | Temp 97.9°F | Wt 243.0 lb

## 2018-10-14 DIAGNOSIS — Z1211 Encounter for screening for malignant neoplasm of colon: Secondary | ICD-10-CM | POA: Diagnosis not present

## 2018-10-14 DIAGNOSIS — G8929 Other chronic pain: Secondary | ICD-10-CM | POA: Diagnosis not present

## 2018-10-14 DIAGNOSIS — E119 Type 2 diabetes mellitus without complications: Secondary | ICD-10-CM

## 2018-10-14 DIAGNOSIS — M47816 Spondylosis without myelopathy or radiculopathy, lumbar region: Secondary | ICD-10-CM

## 2018-10-14 DIAGNOSIS — M25511 Pain in right shoulder: Secondary | ICD-10-CM

## 2018-10-14 DIAGNOSIS — M25512 Pain in left shoulder: Secondary | ICD-10-CM

## 2018-10-14 LAB — POCT GLYCOSYLATED HEMOGLOBIN (HGB A1C): HEMOGLOBIN A1C: 6.9 % — AB (ref 4.0–5.6)

## 2018-10-14 NOTE — Progress Notes (Signed)
HPI: William Webster is a 67 y.o. male who  has a past medical history of Anterior pituitary adenoma syndrome ([facility]), Anxiety, Bimalleolar fracture of right ankle, Colon polyp, Diabetes mellitus without complication ([name]), Diabetic neuropathy ([facility]), Family history of cancer of GI tract, Family history of colon cancer, Gilbert disease, Hyperlipidemia, Low back pain, Lumbar scoliosis, Prolactinoma (William [facility]), Sciatica of right side, and Spondylosis of lumbar joint.  he presents to [facility] today, 10/14/18,  for chief complaint of:  Diabetes follow-up   06/20/18: 7.3%  Today, 10/14/18: 6.2% Glyxambi certainly has improved A1c, cost is an issue for him in the Medicare donut hole at this point.  No hypoglycemia.     Past medical history, surgical history, and family history reviewed.  Current medication list and allergy/intolerance information reviewed.   (See remainder of HPI, ROS, Phys Exam below)  No results found.  Results for orders placed or performed in visit on 10/14/18 (from the past 72 hour(s))  POCT HgB A1C     Status: Abnormal   Collection Time: 10/14/18  9:36 AM  Result Value Ref Range   Hemoglobin A1C 6.9 (A) 4.0 - 5.6 %   HbA1c POC (<> result, manual entry)     HbA1c, POC (prediabetic range)     HbA1c, POC (controlled diabetic range)        ASSESSMENT/PLAN:   Diabetes mellitus without complication (William Webster) - Plan: POCT HgB A1C  Colon cancer screening - Need to track down records from [facility], will look into this      Follow-up plan: Return in about 3 months (around 01/14/2019) for recheck A1C / diabetes - see me or Dr T sooner if needed .                 ############################################ ############################################ ############################################ ############################################    Outpatient Encounter Medications as of 10/14/2018  Medication Sig  .  cabergoline (DOSTINEX) 0.5 MG tablet TAKE 1 TABLET TWICE A WEEK  . Empagliflozin-linaGLIPtin (GLYXAMBI) 25-5 MG TABS Take 1 tablet by mouth daily.  Marland Kitchen glucose blood test strip Check blood sugar twice daily. Diagnosis DM ICD 10 - E11.9  . magnesium oxide (MAG-OX) 400 MG tablet Take 2 tablets (800 mg total) by mouth at bedtime.  . metFORMIN (GLUCOPHAGE) 1000 MG tablet TAKE 1 TABLET (1,000 MG TOTAL) BY MOUTH 2 (TWO) TIMES DAILY WITH A MEAL.  Marland Kitchen ONE TOUCH ULTRA TEST test strip CHECK BLOOD SUGAR TWICE DAILY. DIAGNOSIS DM ICD 10 - E11.9  . pioglitazone (ACTOS) 15 MG tablet TAKE 1 TABLET (15 MG TOTAL) BY MOUTH DAILY.  . rosuvastatin (CRESTOR) 40 MG tablet Take 1 tablet (40 mg total) by mouth daily.   No facility-administered encounter medications on file as of 10/14/2018.    No Known Allergies    Review of Systems:  Constitutional: No recent illness  Cardiac: No  chest pain, No  pressure, No palpitations  Respiratory:  No  shortness of breath. No  Cough  Gastrointestinal: No  abdominal pain, no change on bowel habits  Neurologic: No  weakness, No  Dizziness  Psychiatric: No  concerns with depression, No  concerns with anxiety  Exam:  BP 112/66 (BP Location: Left Arm, Patient Position: Sitting, Cuff Size: Normal)   Pulse 60   Temp 97.9 F (36.6 C) (Oral)   Wt 243 lb (110.2 kg)   BMI 32.96 kg/m   Constitutional: VS see above. General Appearance: alert, well-developed, well-nourished, NAD  Eyes: Normal lids and conjunctive, non-icteric sclera  Ears, Nose, Mouth, Throat: MMM, Normal external inspection ears/nares/mouth/lips/gums.  Neck: No masses, trachea midline.   Respiratory: Normal respiratory effort. no wheeze, no rhonchi, no rales  Cardiovascular: S1/S2 normal, no murmur, no rub/gallop auscultated. RRR.   Musculoskeletal: Gait normal. Symmetric and independent movement of all extremities  Neurological: Normal balance/coordination. No tremor.  Skin: warm, dry, intact.    Psychiatric: Normal judgment/insight. Normal mood and affect. Oriented x3.   Visit summary with medication list and pertinent instructions was printed for patient to review, advised to alert [place] if any changes needed. All questions at time of visit were answered - patient instructed to contact office with any additional concerns. ER/RTC precautions were reviewed with the patient and understanding verbalized.   Follow-up plan: Return in about 3 months (around 01/14/2019) for recheck A1C / diabetes - see me or Dr T sooner if needed .  Note: Total time spent 15 minutes, greater than 50% of the visit was spent face-to-face counseling and coordinating care for the following: The primary encounter diagnosis was Diabetes mellitus without complication ([place]). A diagnosis of Colon cancer screening was also pertinent to this visit.Marland Kitchen  Please note: voice recognition software was used to produce this document, and typos may escape review. Please contact Dr. Sheppard Coil for any needed clarifications.

## 2018-10-14 NOTE — Assessment & Plan Note (Signed)
Continue home rehab exercises, this continues to improve.

## 2018-10-14 NOTE — Assessment & Plan Note (Signed)
Was taking magnesium in the morning, he will switch to bedtime. Known lumbar spinal stenosis with bilateral leg cramps. If insufficient relief at the follow-up visit we will add gabapentin. He has had epidurals in the past without much relief. Home rehab exercises given.

## 2018-10-14 NOTE — Progress Notes (Signed)
Subjective:    CC: Follow-up  HPI: Low back pain: Known lumbar spinal stenosis, not really doing his exercises, history of epidural injections without much relief.  He was having lower extremity cramping so we added magnesium oxide at the last visit directed to take 800 mg at bedtime.  He is only been doing 400 mg in the morning and this is not surprisingly not helping as much.  No bowel or bladder dysfunction, saddle numbness, no constitutional symptoms, no progressive weakness.  Bilateral shoulder pain: Clinically impingement syndrome, has not really been doing his rehab exercises, but overall improving slightly on its own.  I reviewed the past medical history, family history, social history, surgical history, and allergies today and no changes were needed.  Please see the problem list section below in epic for further details.  Past Medical History: Past Medical History:  Diagnosis Date  . Anterior pituitary adenoma syndrome ([place])   . Anxiety   . Bimalleolar fracture of right ankle   . Colon polyp   . Diabetes mellitus without complication ([place])   . Diabetic neuropathy ([place])   . Family history of cancer of GI tract   . Family history of colon cancer   . Gilbert disease   . Hyperlipidemia   . Low back pain   . Lumbar scoliosis   . Prolactinoma ([place])   . Sciatica of right side   . Spondylosis of lumbar joint    Past Surgical History: Past Surgical History:  Procedure Laterality Date  . HEMORRHOID SURGERY    . ORIF ANKLE FRACTURE Right 11/24/2015   Procedure: OPEN REDUCTION INTERNAL FIXATION (ORIF) ANKLE FRACTURE;  Surgeon: Dorna Leitz, MD;  Location: [facility];  Service: Orthopedics;  Laterality: Right;  Open reduction internal fixation right ankle fracture  . TONSILLECTOMY     Social History: Social History   Socioeconomic History  . Marital status: Married    Spouse name: Not on file  . Number of children: Not on file  . Years of education: Not on  file  . Highest education level: Not on file  Occupational History  . Not on file  Social Needs  . Financial resource strain: Not on file  . Food insecurity:    Worry: Not on file    Inability: Not on file  . Transportation needs:    Medical: Not on file    Non-medical: Not on file  Tobacco Use  . Smoking status: Current Some Day Smoker    Types: Cigars  . Smokeless tobacco: Never Used  . Tobacco comment: occassional cigar  Substance and Sexual Activity  . Alcohol use: Yes    Alcohol/week: 10.0 standard drinks    Types: 10 Standard drinks or equivalent per week    Comment: social 12-20 drinks a week  . Drug use: No  . Sexual activity: Yes    Birth control/protection: None  Lifestyle  . Physical activity:    Days per week: Not on file    Minutes per session: Not on file  . Stress: Not on file  Relationships  . Social connections:    Talks on phone: Not on file    Gets together: Not on file    Attends religious service: Not on file    Active member of club or organization: Not on file    Attends meetings of clubs or organizations: Not on file    Relationship status: Not on file  Other Topics Concern  . Not on file  Social  History Narrative  . Not on file   Family History: Family History  Problem Relation Age of Onset  . Cancer Mother        colon  . Heart failure Mother   . Diabetes Mother   . Hyperlipidemia Mother   . Stroke Mother   . Stroke Father   . Hyperlipidemia Father   . Diabetes Maternal Grandfather   . Heart disease Maternal Grandfather   . Stroke Maternal Grandfather   . Diabetes Paternal Grandfather   . Heart disease Paternal Grandfather   . Stroke Paternal Grandfather    Allergies: No Known Allergies Medications: See med rec.  Review of Systems: No fevers, chills, night sweats, weight loss, chest pain, or shortness of breath.   Objective:    General: Well Developed, well nourished, and in no acute distress.  Neuro: Alert and oriented  x3, extra-ocular muscles intact, sensation grossly intact.  HEENT: Normocephalic, atraumatic, pupils equal round reactive to light, neck supple, no masses, no lymphadenopathy, thyroid nonpalpable.  Skin: Warm and dry, no rashes. Cardiac: Regular rate and rhythm, no murmurs rubs or gallops, no lower extremity edema.  Respiratory: Clear to auscultation bilaterally. Not using accessory muscles, speaking in full sentences.  Impression and Recommendations:    Spondylosis of lumbar region without myelopathy or radiculopathy Was taking magnesium in the morning, he will switch to bedtime. Known lumbar spinal stenosis with bilateral leg cramps. If insufficient relief at the follow-up visit we will add gabapentin. He has had epidurals in the past without much relief. Home rehab exercises given.  Bilateral shoulder pain Continue home rehab exercises, this continues to improve.  I spent 25 minutes with this patient, greater than 50% was face-to-face time counseling regarding the above diagnoses, specifically we discussed the pathophysiology of shoulder impingement syndrome and the importance of rehabilitation in treating this. ___________________________________________ Gwen Her. Dianah Field, M.D., ABFM., CAQSM. Primary Care and Sports Medicine [facility]  Adjunct Professor of [employer]

## 2018-10-17 ENCOUNTER — Telehealth: Payer: Self-pay | Admitting: Osteopathic Medicine

## 2018-10-17 NOTE — Telephone Encounter (Signed)
Called and was given the number for [facility] with [employer] and he will reach out to our representative for [place] area to bring samples on 10/18/2018.

## 2018-10-17 NOTE — Telephone Encounter (Signed)
Please call patient: As far as splitting the medications and Glyxambi to separate prescriptions, I do not think that this will be any cheaper given that he is in the donut hole anyway.  If he would like, but see if we can contact the drug reps and see if they might be able to give him some samples to last him until January?

## 2018-10-17 NOTE — Telephone Encounter (Signed)
Patient is aware we are trying to get him samples. I advised him that I would let him know if our office was able to get samples. He voices understanding.

## 2018-10-17 NOTE — Telephone Encounter (Signed)
-----   Message from Emeterio Reeve, DO sent at 10/14/2018  5:13 PM EDT ----- Regarding: glyxambi alternative Alternative Rx?

## 2018-10-18 NOTE — Telephone Encounter (Signed)
Patient will come in around 930 am on 10/21/2018 to pick up 3 Glyxambi.    3 bottles: Lot Number: [id number], NDC: [id number] Exp: 05/24/2020.   Samples in drawer up front.

## 2018-11-23 ENCOUNTER — Other Ambulatory Visit: Payer: Self-pay | Admitting: Osteopathic Medicine

## 2018-11-23 DIAGNOSIS — E119 Type 2 diabetes mellitus without complications: Secondary | ICD-10-CM

## 2019-01-13 ENCOUNTER — Ambulatory Visit (INDEPENDENT_AMBULATORY_CARE_PROVIDER_SITE_OTHER): Payer: Medicare Other | Admitting: Osteopathic Medicine

## 2019-01-13 ENCOUNTER — Encounter: Payer: Self-pay | Admitting: Osteopathic Medicine

## 2019-01-13 VITALS — BP 119/62 | HR 53 | Temp 98.4°F | Wt 253.4 lb

## 2019-01-13 DIAGNOSIS — E119 Type 2 diabetes mellitus without complications: Secondary | ICD-10-CM

## 2019-01-13 LAB — POCT GLYCOSYLATED HEMOGLOBIN (HGB A1C): Hemoglobin A1C: 7.5 % — AB (ref 4.0–5.6)

## 2019-01-13 MED ORDER — LISINOPRIL 2.5 MG PO TABS: 2.5000 mg | ORAL_TABLET | Freq: Every day | ORAL | 1 refills | Status: DC

## 2019-01-13 NOTE — Progress Notes (Signed)
HPI: William Webster is a 68 y.o. male who  has a past medical history of Anterior pituitary adenoma syndrome ([place]), Anxiety, Bimalleolar fracture of right ankle, Colon polyp, Diabetes mellitus without complication ([place]), Diabetic neuropathy ([place]), Family history of cancer of GI tract, Family history of colon cancer, Gilbert disease, Hyperlipidemia, Low back pain, Lumbar scoliosis, Prolactinoma ([place]), Sciatica of right side, and Spondylosis of lumbar joint.  he presents to [facility] today, 01/13/19,  for chief complaint of:  DM2 follow-up  06/20/18: 7.3%  10/14/18: 2.7% Glyxambi certainly improved X4J, cost was an issue for him in the Medicare donut hole.  No hypoglycemia. Today 01/13/19: 7.5% w/ weight gain 10 lbs since last visit, notes room for improvement diet/exercise.   Microalbuminuria - open to starting ACE per guidelines.     At today's visit... Past medical history, surgical history, and family history reviewed and updated as needed.  Current medication list and allergy/intolerance information reviewed and updated as needed. (See remainder of HPI, ROS, Phys Exam below)      Results for orders placed or performed in visit on 01/13/19 (from the past 72 hour(s))  POCT HgB A1C     Status: Abnormal   Collection Time: 01/13/19  8:53 AM  Result Value Ref Range   Hemoglobin A1C 7.5 (A) 4.0 - 5.6 %   HbA1c POC (<> result, manual entry)     HbA1c, POC (prediabetic range)     HbA1c, POC (controlled diabetic range)            ASSESSMENT/PLAN: The encounter diagnosis was Diabetes mellitus without complication ([place]).   Orders Placed This Encounter  Procedures  . POCT HgB A1C     Meds ordered this encounter  Medications  . lisinopril (PRINIVIL,ZESTRIL) 2.5 MG tablet    Sig: Take 1 tablet (2.5 mg total) by mouth daily.    Dispense:  90 tablet    Refill:  1      Follow-up plan: Return in about 3 months (around 04/14/2019)  for A1C RECHECK - SOONER IF NEEDED .                            ############################################ ############################################ ############################################ ############################################    Current Meds  Medication Sig  . cabergoline (DOSTINEX) 0.5 MG tablet TAKE 1 TABLET TWICE A WEEK  . Empagliflozin-linaGLIPtin (GLYXAMBI) 25-5 MG TABS Take 1 tablet by mouth daily.  Marland Kitchen glucose blood test strip Check blood sugar twice daily. Diagnosis DM ICD 10 - E11.9  . magnesium oxide (MAG-OX) 400 MG tablet Take 2 tablets (800 mg total) by mouth at bedtime.  . metFORMIN (GLUCOPHAGE) 1000 MG tablet TAKE 1 TABLET (1,000 MG TOTAL) BY MOUTH 2 (TWO) TIMES DAILY WITH A MEAL.  Marland Kitchen ONE TOUCH ULTRA TEST test strip CHECK BLOOD SUGAR TWICE DAILY. DIAGNOSIS DM ICD 10 - E11.9  . pioglitazone (ACTOS) 15 MG tablet TAKE 1 TABLET (15 MG TOTAL) BY MOUTH DAILY.  . rosuvastatin (CRESTOR) 40 MG tablet TAKE 1 TABLET (40 MG TOTAL) BY MOUTH DAILY.    No Known Allergies     Review of Systems:  Constitutional: No recent illness  HEENT: No  headache, no vision change  Cardiac: No  chest pain, No  pressure, No palpitations  Respiratory:  No  shortness of breath. No  Cough  Gastrointestinal: No  abdominal pain, no change on bowel habits  Musculoskeletal: No new myalgia/arthralgia  Neurologic: No  weakness, No  Dizziness   Exam:  BP 119/62 (BP Location: Left Arm, Patient Position: Sitting, Cuff Size: Normal)   Pulse (!) 53   Temp 98.4 F (36.9 C) (Oral)   Wt 253 lb 6.4 oz (114.9 kg)   BMI 34.37 kg/m   Constitutional: VS see above. General Appearance: alert, well-developed, well-nourished, NAD  Eyes: Normal lids and conjunctive, non-icteric sclera  Ears, Nose, Mouth, Throat: MMM, Normal external inspection ears/nares/mouth/lips/gums.  Neck: No masses, trachea midline.   Respiratory: Normal respiratory effort. no wheeze, no  rhonchi, no rales  Cardiovascular: S1/S2 normal, no murmur, no rub/gallop auscultated. RRR.   Musculoskeletal: Gait normal. Symmetric and independent movement of all extremities  Neurological: Normal balance/coordination. No tremor.  Skin: warm, dry, intact.   Psychiatric: Normal judgment/insight. Normal mood and affect. Oriented x3.       Visit summary with medication list and pertinent instructions was printed for patient to review, patient was advised to alert [place] if any updates are needed. All questions at time of visit were answered - patient instructed to contact office with any additional concerns. ER/RTC precautions were reviewed with the patient and understanding verbalized.   Note: Total time spent 25 minutes, greater than 50% of the visit was spent face-to-face counseling and coordinating care for the following: The encounter diagnosis was Diabetes mellitus without complication ([place])..  Please note: voice recognition software was used to produce this document, and typos may escape review. Please contact Dr. Sheppard Coil for any needed clarifications.    Follow up plan: Return in about 3 months (around 04/14/2019) for A1C RECHECK - SOONER IF NEEDED .

## 2019-01-26 ENCOUNTER — Other Ambulatory Visit: Payer: Self-pay | Admitting: Osteopathic Medicine

## 2019-01-26 DIAGNOSIS — E221 Hyperprolactinemia: Secondary | ICD-10-CM

## 2019-02-13 ENCOUNTER — Encounter: Payer: Self-pay | Admitting: Osteopathic Medicine

## 2019-02-17 ENCOUNTER — Other Ambulatory Visit: Payer: Self-pay | Admitting: Osteopathic Medicine

## 2019-02-17 DIAGNOSIS — E119 Type 2 diabetes mellitus without complications: Secondary | ICD-10-CM

## 2019-04-14 ENCOUNTER — Ambulatory Visit: Payer: Self-pay | Admitting: Osteopathic Medicine

## 2019-04-17 ENCOUNTER — Encounter: Payer: Self-pay | Admitting: Osteopathic Medicine

## 2019-04-17 ENCOUNTER — Ambulatory Visit (INDEPENDENT_AMBULATORY_CARE_PROVIDER_SITE_OTHER): Payer: Medicare Other | Admitting: Osteopathic Medicine

## 2019-04-17 VITALS — BP 142/76 | HR 73 | Temp 98.0°F | Ht 72.0 in | Wt 248.0 lb

## 2019-04-17 DIAGNOSIS — E1169 Type 2 diabetes mellitus with other specified complication: Secondary | ICD-10-CM

## 2019-04-17 DIAGNOSIS — Z0189 Encounter for other specified special examinations: Secondary | ICD-10-CM | POA: Diagnosis not present

## 2019-04-17 DIAGNOSIS — Z86010 Personal history of colonic polyps: Secondary | ICD-10-CM | POA: Diagnosis not present

## 2019-04-17 DIAGNOSIS — E785 Hyperlipidemia, unspecified: Secondary | ICD-10-CM | POA: Diagnosis not present

## 2019-04-17 DIAGNOSIS — Z1211 Encounter for screening for malignant neoplasm of colon: Secondary | ICD-10-CM

## 2019-04-17 LAB — POCT GLYCOSYLATED HEMOGLOBIN (HGB A1C): Hemoglobin A1C: 7.3 % — AB (ref 4.0–5.6)

## 2019-04-17 NOTE — Progress Notes (Signed)
HPI: William Webster is a 68 y.o. male who  has a past medical history of Anterior pituitary adenoma syndrome ([place]), Anxiety, Bimalleolar fracture of right ankle, Colon polyp, Diabetes mellitus without complication ([place]), Diabetic neuropathy ([place]), Family history of cancer of GI tract, Family history of colon cancer, Gilbert disease, Hyperlipidemia, Low back pain, Lumbar scoliosis, Prolactinoma ([place]), Sciatica of right side, and Spondylosis of lumbar joint.  he presents to [facility] today, 04/17/19,  for chief complaint of:  DM2 follow-up   DIABETES SCREENING/PREVENTIVE CARE: A1C past 3-6 mos: Yes  controlled? could be better but hasn't been terrible   06/20/18: 7.3%   10/14/18: 9.3%  Glyxambicertainly improved X9K, cost was an issue for him in the Medicare donut hole. No hypoglycemia.  01/13/19: 7.5% w/ weight gain 10 lbs since last visit, notes room for improvement diet/exercise.   Today 04/17/19: 7.3% -  Current antihyperglycemic meds as of 04/17/19:  Metformin 1000 mg bid  Pioglitazone 15 mg daily  Empagliflozin-Linagliptin 25-5 mg saily BP goal <130/80: Yes   BP Readings from Last 3 Encounters:  04/17/19 (!) 142/76  01/13/19 119/62  10/14/18 112/66  LDL goal <70: Yes  was 53 as of 05/2018 Eye exam annually: Yes , importance discussed with patient Foot exam: needs - done today  Microalbuminuria:Yes  Metformin: Yes  ACE/ARB: Yes  Antiplatelet if ASCVD Risk >10%: needs  The 10-year ASCVD risk score Mikey Bussing DC Jr., et al., 2013) is: 25.4%   Values used to calculate the score:     Age: 28 years     Sex: Male     Is Non-Hispanic African American: No     Diabetic: Yes     Tobacco smoker: Yes     Systolic Blood Pressure: 240 mmHg     Is BP treated: No     HDL Cholesterol: 61 mg/dL     Total Cholesterol: 133 mg/dL Statin: Yes  Pneumovax: Yes  but due again now that he's 67 Immunization History  Administered Date(s)  Administered  . Influenza,inj,Quad PF,6+ Mos 08/23/2015, 09/16/2018  . Influenza-Unspecified 09/07/2016, 10/08/2017  . Pneumococcal Polysaccharide-23 01/12/2016  . Tdap 03/17/2013  . Zoster 08/23/2015  BMI/weight:  Wt Readings from Last 3 Encounters:  04/17/19 248 lb (112.5 kg)  01/13/19 253 lb 6.4 oz (114.9 kg)  10/14/18 243 lb (110.2 kg)     Other: Feet have been dry Lower back issues - seeing sports med Dr T in our office Due for repeat Cologuard, last was 12/2015 and was negative       At today's visit 04/17/19 ... PMH, PSH, FH reviewed and updated as needed.  Current medication list and allergy/intolerance hx reviewed and updated as needed. (See remainder of HPI, ROS, Phys Exam below)   No results found.  Results for orders placed or performed in visit on 04/17/19 (from the past 72 hour(s))  POCT glycosylated hemoglobin (Hb A1C)     Status: Abnormal   Collection Time: 04/17/19  8:24 AM  Result Value Ref Range   Hemoglobin A1C 7.3 (A) 4.0 - 5.6 %   HbA1c POC (<> result, manual entry)     HbA1c, POC (prediabetic range)     HbA1c, POC (controlled diabetic range)            ASSESSMENT/PLAN: The primary encounter diagnosis was Controlled type 2 diabetes mellitus with other specified complication, without long-term current use of insulin ([place]). Diagnoses of Hyperlipidemia associated with type 2 diabetes mellitus ([place]), Screening for malignant  neoplasm of colon, History of colon polyps, and Patient requested diagnostic testing were also pertinent to this visit.   Hx colon polyps on older colonoscopy but subsequent was norma lper patient, we have not been able to get records form [facility], will refer to GI for recs re: repeat scope vs ok to continue cologuard   Work on diet/exercise  Given instructions for home exercises for back pain  Febrile respiratory illness earlier this year, requests Ab testing for SARS-CoV2   Orders Placed This Encounter  Procedures  .  SAR CoV2 Serology (COVID 19)AB(IGG)IA  . Cologuard  . CBC  . COMPLETE METABOLIC PANEL WITH GFR  . Lipid panel  . Hemoglobin A1c  . Microalbumin / creatinine urine ratio  . PSA, Total with Reflex to PSA, Free  . Ambulatory referral to Gastroenterology  . POCT glycosylated hemoglobin (Hb A1C)     No orders of the defined types were placed in this encounter.   There are no Patient Instructions on file for this visit.    Follow-up plan: Return in about 3 months (around 07/17/2019) for recheck labs (diabetes), see me sooner if needed.                                                 ################################################# ################################################# ################################################# #################################################    Current Meds  Medication Sig  . cabergoline (DOSTINEX) 0.5 MG tablet TAKE 1 TABLET TWICE A WEEK  . Empagliflozin-linaGLIPtin (GLYXAMBI) 25-5 MG TABS Take 1 tablet by mouth daily.  Marland Kitchen glucose blood test strip Check blood sugar twice daily. Diagnosis DM ICD 10 - E11.9  . lisinopril (PRINIVIL,ZESTRIL) 2.5 MG tablet Take 1 tablet (2.5 mg total) by mouth daily.  . magnesium oxide (MAG-OX) 400 MG tablet Take 2 tablets (800 mg total) by mouth at bedtime.  . metFORMIN (GLUCOPHAGE) 1000 MG tablet TAKE 1 TABLET (1,000 MG TOTAL) BY MOUTH 2 (TWO) TIMES DAILY WITH A MEAL.  Marland Kitchen ONE TOUCH ULTRA TEST test strip CHECK BLOOD SUGAR TWICE DAILY. DIAGNOSIS DM ICD 10 - E11.9  . pioglitazone (ACTOS) 15 MG tablet TAKE 1 TABLET (15 MG TOTAL) BY MOUTH DAILY.  . rosuvastatin (CRESTOR) 40 MG tablet TAKE 1 TABLET (40 MG TOTAL) BY MOUTH DAILY.    No Known Allergies     Review of Systems:  Constitutional: No recent illness  HEENT: No  headache, no vision change  Cardiac: No  chest pain, No  pressure, No palpitations  Respiratory:  No  shortness of breath. No   Cough  Gastrointestinal: No  abdominal pain, no change on bowel habits  Musculoskeletal: No new myalgia/arthralgia, LBP chronic  Psychiatric: No  concerns with depression, No  concerns with anxiety   Exam:  BP (!) 142/76   Pulse 73   Temp 98 F (36.7 C) (Oral)   Ht 6' (1.829 m)   Wt 248 lb (112.5 kg)   SpO2 99%   BMI 33.63 kg/m   Constitutional: VS see above. General Appearance: alert, well-developed, well-nourished, NAD  Eyes: Normal lids and conjunctive, non-icteric sclera  Ears, Nose, Mouth, Throat: MMM, Normal external inspection ears/nares/mouth/lips/gums.  Neck: No masses, trachea midline.   Respiratory: Normal respiratory effort. no wheeze, no rhonchi, no rales  Cardiovascular: S1/S2 normal, no murmur, no rub/gallop auscultated. RRR.   Musculoskeletal: Gait normal. Symmetric and independent movement of all extremities  Neurological: Normal balance/coordination.  No tremor.  Skin: warm, dry, intact.   Psychiatric: Normal judgment/insight. Normal mood and affect. Oriented x3.       Visit summary with medication list and pertinent instructions was printed for patient to review, patient was advised to alert [place] if any updates are needed. All questions at time of visit were answered - patient instructed to contact office with any additional concerns. ER/RTC precautions were reviewed with the patient and understanding verbalized.   Note: Total time spent 25 minutes, greater than 50% of the visit was spent face-to-face counseling and coordinating care for the following: The primary encounter diagnosis was Controlled type 2 diabetes mellitus with other specified complication, without long-term current use of insulin ([place]). Diagnoses of Hyperlipidemia associated with type 2 diabetes mellitus ([place]), Screening for malignant neoplasm of colon, History of colon polyps, and Patient requested diagnostic testing were also pertinent to this visit.Marland Kitchen  Please note: voice recognition  software was used to produce this document, and typos may escape review. Please contact Dr. Sheppard Coil for any needed clarifications.    Follow up plan: Return in about 3 months (around 07/17/2019) for recheck labs (diabetes), see me sooner if needed.

## 2019-05-15 DIAGNOSIS — E119 Type 2 diabetes mellitus without complications: Secondary | ICD-10-CM | POA: Diagnosis not present

## 2019-05-15 DIAGNOSIS — H527 Unspecified disorder of refraction: Secondary | ICD-10-CM | POA: Diagnosis not present

## 2019-05-15 DIAGNOSIS — Z7984 Long term (current) use of oral hypoglycemic drugs: Secondary | ICD-10-CM | POA: Diagnosis not present

## 2019-05-22 ENCOUNTER — Other Ambulatory Visit: Payer: Self-pay

## 2019-05-22 ENCOUNTER — Encounter: Payer: Self-pay | Admitting: Gastroenterology

## 2019-05-22 ENCOUNTER — Telehealth (INDEPENDENT_AMBULATORY_CARE_PROVIDER_SITE_OTHER): Payer: Medicare Other | Admitting: Gastroenterology

## 2019-05-22 VITALS — Ht 72.0 in | Wt 245.0 lb

## 2019-05-22 DIAGNOSIS — Z800 Family history of malignant neoplasm of digestive organs: Secondary | ICD-10-CM

## 2019-05-22 DIAGNOSIS — Z86010 Personal history of colonic polyps: Secondary | ICD-10-CM

## 2019-05-22 NOTE — Progress Notes (Signed)
Chief Complaint: Discuss ongoing CRC screening/polyp surveillance, family history of colon cancer  Referring Provider:     Emeterio Reeve, DO   HPI:    Due to current restrictions/limitations of in-office visits due to the COVID-19 pandemic, this scheduled clinical appointment was converted to a telehealth consultation via telephone.  -Time of medical discussion: 25 minutes -The patient did consent to this telephone visit and is aware of possible charges through their insurance for this visit.  -Names of all parties present: William Webster (patient), Gerrit Heck, DO, [facility] (physician)  William Webster is a 68 y.o. male referred to the Gastroenterology Clinic to discuss ongoing CRC screening/polyp surveillance.  Has had multiple colonoscopies at [facility], but requesting to change to [facility]. No previous endoscopy reports in EMR for review, but he reports he has had polyps in the past, including colonoscopy in approximately 2006 that was notable for a high risk polyp, with recommendation for repeat colonoscopy in 1 year. Repeat colonoscopy in 2007 was normal per patient.  Last colonoscopy was 10/2011.  He reports this was normal, but again no report for review in EMR, but there is a pathology report noting 2 specimens submitted, but histology report is not available for review.  Additionally, FHx n/f mother with CRC, diagnosed late 34's or 28 yo. Otherwise, no hematochezia or melena, changes in bowel habits, n/v/d/c/f/c.  No issues with previous sedation.  Normal CBC in 05/2018.  CMP normal then, aside from T bili 1.8.  Historically elevated since at least 11/2010.  At that time, T bili 4.9 with indirect 4.7 and direct 0.2, consistent with Gilbert's Disease.  Normal ALP, AST, ALT.  (2.0).  Cologuard negative 12/2015.  Endoscopic history: -Colonoscopy (10/2011, [facility]): No report for review.  Partial pathology report noting 2 specimens  submitted, but unknown histologic result.  Recommended repeat in 5 years per patient -Colonoscopy (2007): Normal per patient, recommended repeat in 5 years -Colonoscopy (2006): ?  High risk polyp per patient -Colonoscopy (12/1999): No endoscopy report available for review, but path report notable for hyperplastic polyps  Past medical history, past surgical history, social history, family history, medications, and allergies reviewed in the chart and with patient over the phone.  Past Medical History:  Diagnosis Date  . Anterior pituitary adenoma syndrome ([place])   . Anxiety   . Bimalleolar fracture of right ankle   . Colon polyp   . Diabetes mellitus without complication ([place])   . Diabetic neuropathy ([place])   . Family history of cancer of GI tract   . Family history of colon cancer   . Gilbert disease   . Hyperlipidemia   . Low back pain   . Lumbar scoliosis   . Prolactinoma ([place])   . Sciatica of right side   . Spondylosis of lumbar joint      Past Surgical History:  Procedure Laterality Date  . HEMORRHOID SURGERY    . ORIF ANKLE FRACTURE Right 11/24/2015   Procedure: OPEN REDUCTION INTERNAL FIXATION (ORIF) ANKLE FRACTURE;  Surgeon: Dorna Leitz, MD;  Location: [facility];  Service: Orthopedics;  Laterality: Right;  Open reduction internal fixation right ankle fracture  . TONSILLECTOMY     Family History  Problem Relation Age of Onset  . Heart failure Mother   . Diabetes Mother   . Hyperlipidemia Mother   . Stroke Mother   . Colon cancer Mother   . Stroke  Father   . Hyperlipidemia Father   . Diabetes Maternal Grandfather   . Heart disease Maternal Grandfather   . Stroke Maternal Grandfather   . Diabetes Paternal Grandfather   . Heart disease Paternal Grandfather   . Stroke Paternal Grandfather   . Esophageal cancer Neg Hx    Social History   Tobacco Use  . Smoking status: Current Some Day Smoker    Types: Cigars  . Smokeless tobacco: Never Used  .  Tobacco comment: occassional cigar-every month   Substance Use Topics  . Alcohol use: Yes    Alcohol/week: 10.0 standard drinks    Types: 10 Standard drinks or equivalent per week    Comment: social 12-20 drinks a week  . Drug use: No   Current Outpatient Medications  Medication Sig Dispense Refill  . cabergoline (DOSTINEX) 0.5 MG tablet TAKE 1 TABLET TWICE A WEEK (Patient taking differently: Take 1 tablet twice a week. On Monday and Friday) 30 tablet 2  . Empagliflozin-linaGLIPtin (GLYXAMBI) 25-5 MG TABS Take 1 tablet by mouth daily. 90 tablet 3  . glucose blood test strip Check blood sugar twice daily. Diagnosis DM ICD 10 - E11.9 200 each prn  . lisinopril (PRINIVIL,ZESTRIL) 2.5 MG tablet Take 1 tablet (2.5 mg total) by mouth daily. 90 tablet 1  . magnesium oxide (MAG-OX) 400 MG tablet Take 2 tablets (800 mg total) by mouth at bedtime. (Patient taking differently: Take 400 mg by mouth daily. ) 180 tablet 3  . metFORMIN (GLUCOPHAGE) 1000 MG tablet TAKE 1 TABLET (1,000 MG TOTAL) BY MOUTH 2 (TWO) TIMES DAILY WITH A MEAL. 180 tablet 3  . ONE TOUCH ULTRA TEST test strip CHECK BLOOD SUGAR TWICE DAILY. DIAGNOSIS DM ICD 10 - E11.9 100 each 4  . pioglitazone (ACTOS) 15 MG tablet TAKE 1 TABLET (15 MG TOTAL) BY MOUTH DAILY. 90 tablet 3  . rosuvastatin (CRESTOR) 40 MG tablet TAKE 1 TABLET (40 MG TOTAL) BY MOUTH DAILY. 90 tablet 0   No current facility-administered medications for this visit.    No Known Allergies   Review of Systems: All systems reviewed and negative except where noted in HPI.     Physical Exam:    Physical exam not completed due to the nature of this telehealth communication.  Patient was otherwise alert and oriented and well communicative.   ASSESSMENT AND PLAN;   1) Polyp surveillance 2) Family history of CRC 68 year old male with family history notable for mother with CRC diagnosed in her late 33s or age 66.  Personal history of polyps previously, including a  possible high risk polyp in approximately 2006.  No reports available for review, and only mention of specimens submitted, but unable to review histology reports either.  All previous colonoscopies completed at [facility].  Recommended 5-year repeat at time of colonoscopy in 2012.  Otherwise without GI symptoms.  -Obtain medical records from [facility] if possible -Repeat colonoscopy now due to personal history of polyps, previous recommendation for 5-year interval   3) Gilbert's Disease: - Stable T bili.  Discussed this diagnosis today.  Otherwise no personal or family history of hepatobiliary disease and normal AST, ALT, ALP.  The indications, risks, and benefits of colonoscopy were explained to the patient in detail. Risks include but are not limited to bleeding, perforation, adverse reaction to medications, and cardiopulmonary compromise. Sequelae include but are not limited to the possibility of surgery, hospitalization, and mortality. The patient verbalized understanding and wished to proceed. All questions  answered, referred to the scheduler and bowel prep ordered. Further recommendations pending results of the exam.     Lavena Bullion, DO, FACG  05/22/2019, 8:25 AM   Emeterio Reeve, DO

## 2019-05-22 NOTE — Patient Instructions (Signed)
If you are age 66 or older, your body mass index should be between 23-30. Your Body mass index is 33.23 kg/m. If this is out of the aforementioned range listed, please consider follow up with your Primary Care Provider.  If you are age 20 or younger, your body mass index should be between 19-25. Your Body mass index is 33.23 kg/m. If this is out of the aformentioned range listed, please consider follow up with your Primary Care Provider.   It has been recommended to you by your physician that you have a(n) Colonoscopy completed. Per your request, we did not schedule the procedure(s) today. Please contact our office at [phone] should you decide to have the procedure completed.  It was a pleasure to see you today!  Vito Cirigliano, D.O.

## 2019-05-24 ENCOUNTER — Other Ambulatory Visit: Payer: Self-pay | Admitting: Osteopathic Medicine

## 2019-05-24 DIAGNOSIS — E119 Type 2 diabetes mellitus without complications: Secondary | ICD-10-CM

## 2019-05-30 ENCOUNTER — Encounter: Payer: Self-pay | Admitting: Gastroenterology

## 2019-06-03 ENCOUNTER — Other Ambulatory Visit: Payer: Self-pay | Admitting: Osteopathic Medicine

## 2019-06-03 DIAGNOSIS — E119 Type 2 diabetes mellitus without complications: Secondary | ICD-10-CM

## 2019-06-20 ENCOUNTER — Ambulatory Visit: Payer: Medicare Other | Admitting: *Deleted

## 2019-06-20 ENCOUNTER — Other Ambulatory Visit: Payer: Self-pay

## 2019-06-20 VITALS — Ht 72.0 in | Wt 245.0 lb

## 2019-06-20 DIAGNOSIS — Z800 Family history of malignant neoplasm of digestive organs: Secondary | ICD-10-CM

## 2019-06-20 DIAGNOSIS — Z86010 Personal history of colonic polyps: Secondary | ICD-10-CM

## 2019-06-20 NOTE — Progress Notes (Signed)

## 2019-07-01 ENCOUNTER — Other Ambulatory Visit: Payer: Self-pay | Admitting: Osteopathic Medicine

## 2019-07-01 DIAGNOSIS — Z794 Long term (current) use of insulin: Secondary | ICD-10-CM

## 2019-07-01 DIAGNOSIS — E119 Type 2 diabetes mellitus without complications: Secondary | ICD-10-CM

## 2019-07-01 NOTE — Telephone Encounter (Signed)
Please advise 

## 2019-07-03 ENCOUNTER — Telehealth: Payer: Self-pay | Admitting: Gastroenterology

## 2019-07-03 NOTE — Telephone Encounter (Signed)

## 2019-07-04 ENCOUNTER — Encounter: Payer: Self-pay | Admitting: Gastroenterology

## 2019-07-04 ENCOUNTER — Other Ambulatory Visit: Payer: Self-pay | Admitting: Osteopathic Medicine

## 2019-07-04 ENCOUNTER — Ambulatory Visit (AMBULATORY_SURGERY_CENTER): Payer: Medicare Other | Admitting: Gastroenterology

## 2019-07-04 ENCOUNTER — Other Ambulatory Visit: Payer: Self-pay

## 2019-07-04 VITALS — BP 124/71 | HR 52 | Temp 97.8°F | Resp 16 | Ht 72.0 in | Wt 245.0 lb

## 2019-07-04 DIAGNOSIS — D125 Benign neoplasm of sigmoid colon: Secondary | ICD-10-CM

## 2019-07-04 DIAGNOSIS — Z86010 Personal history of colonic polyps: Secondary | ICD-10-CM

## 2019-07-04 DIAGNOSIS — K635 Polyp of colon: Secondary | ICD-10-CM

## 2019-07-04 DIAGNOSIS — D120 Benign neoplasm of cecum: Secondary | ICD-10-CM

## 2019-07-04 DIAGNOSIS — Z800 Family history of malignant neoplasm of digestive organs: Secondary | ICD-10-CM

## 2019-07-04 DIAGNOSIS — D122 Benign neoplasm of ascending colon: Secondary | ICD-10-CM

## 2019-07-04 DIAGNOSIS — Z85038 Personal history of other malignant neoplasm of large intestine: Secondary | ICD-10-CM | POA: Diagnosis not present

## 2019-07-04 DIAGNOSIS — K640 First degree hemorrhoids: Secondary | ICD-10-CM

## 2019-07-04 MED ORDER — SODIUM CHLORIDE 0.9 % IV SOLN: 500.0000 mL | Freq: Once | INTRAVENOUS | Status: DC

## 2019-07-04 NOTE — Patient Instructions (Signed)
Impression/Recommendations:  Polyp handout given to patient. Hemorrhoid handout given to patient.  Resume previous diet. Continue present medications. YOU HAD AN ENDOSCOPIC PROCEDURE TODAY AT [facility]:   Refer to the procedure report that was given to you for any specific questions about what was found during the examination.  If the procedure report does not answer your questions, please call your gastroenterologist to clarify.  If you requested that your care partner not be given the details of your procedure findings, then the procedure report has been included in a sealed envelope for you to review at your convenience later.  YOU SHOULD EXPECT: Some feelings of bloating in the abdomen. Passage of more gas than usual.  Walking can help get rid of the air that was put into your GI tract during the procedure and reduce the bloating. If you had a lower endoscopy (such as a colonoscopy or flexible sigmoidoscopy) you may notice spotting of blood in your stool or on the toilet paper. If you underwent a bowel prep for your procedure, you may not have a normal bowel movement for a few days.  Please Note:  You might notice some irritation and congestion in your nose or some drainage.  This is from the oxygen used during your procedure.  There is no need for concern and it should clear up in a day or so.  SYMPTOMS TO REPORT IMMEDIATELY:   Following lower endoscopy (colonoscopy or flexible sigmoidoscopy):  Excessive amounts of blood in the stool  Significant tenderness or worsening of abdominal pains  Swelling of the abdomen that is new, acute  Fever of 100F or higher  For urgent or emergent issues, a gastroenterologist can be reached at any hour by calling [phone].   DIET:  We do recommend a small meal at first, but then you may proceed to your regular diet.  Drink plenty of fluids but you should avoid alcoholic beverages for 24 hours.  ACTIVITY:  You should plan to  take it easy for the rest of today and you should NOT DRIVE or use heavy machinery until tomorrow (because of the sedation medicines used during the test).    FOLLOW UP: Our staff will call the number listed on your records 48-72 hours following your procedure to check on you and address any questions or concerns that you may have regarding the information given to you following your procedure. If we do not reach you, we will leave a message.  We will attempt to reach you two times.  During this call, we will ask if you have developed any symptoms of COVID 19. If you develop any symptoms (ie: fever, flu-like symptoms, shortness of breath, cough etc.) before then, please call [phone].  If you test positive for Covid 19 in the 2 weeks post procedure, please call and report this information to [place].    If any biopsies were taken you will be contacted by phone or by letter within the next 1-3 weeks.  Please call us at [phone] if you have not heard about the biopsies in 3 weeks.    SIGNATURES/CONFIDENTIALITY: You and/or your care partner have signed paperwork which will be entered into your electronic medical record.  These signatures attest to the fact that that the information above on your After Visit Summary has been reviewed and is understood.  Full responsibility of the confidentiality of this discharge information lies with you and/or your care-partner.Await pathology results.  Repeat colonoscopy in 3 years for  surveillance based on pathology results.  Return to GI office PRN.  Use fiber, for example Citrucel, Fibercon, Konsyl, or Metamucil.  I

## 2019-07-04 NOTE — Progress Notes (Signed)
Called to room to assist during endoscopic procedure.  Patient ID and intended procedure confirmed with present staff. Received instructions for my participation in the procedure from the performing physician.  

## 2019-07-04 NOTE — Telephone Encounter (Signed)
Forwarding medication refill to PCP for review. 

## 2019-07-04 NOTE — Progress Notes (Signed)
A/ox3, pleased with MAC, report to RN 

## 2019-07-04 NOTE — Op Note (Signed)
[facility] Patient Name: William Webster Procedure Date: 07/04/2019 10:44 AM MRN: [id number] Endoscopist: Gerrit Heck , MD Age: 68 Referring MD:  Date of Birth: 1951-03-27 Gender: Male Account #: [id number] Procedure:                Colonoscopy Indications:              Surveillance: Personal history of colonic polyps                            (unknown histology) on last colonoscopy more than 5                            years ago. Has had polyps on colonsocopy dating                            back to 2006, with reportedly normal colonoscopy in                            2007, then polyps again noted in 2012, all                            completed at outside facility. Otherwise without                            active GI symptoms. family history notable for                            mother with CRC age >13. Medicines:                Monitored Anesthesia Care Procedure:                Pre-Anesthesia Assessment:                           - Prior to the procedure, a History and Physical                            was performed, and patient medications and                            allergies were reviewed. The patient's tolerance of                            previous anesthesia was also reviewed. The risks                            and benefits of the procedure and the sedation                            options and risks were discussed with the patient.                            All questions were answered, and informed consent  was obtained. Prior Anticoagulants: The patient has                            taken no previous anticoagulant or antiplatelet                            agents. ASA Grade Assessment: II - A patient with                            mild systemic disease. After reviewing the risks                            and benefits, the patient was deemed in                            satisfactory condition to undergo the  procedure.                           After obtaining informed consent, the colonoscope                            was passed under direct vision. Throughout the                            procedure, the patient's blood pressure, pulse, and                            oxygen saturations were monitored continuously. The                            Colonoscope was introduced through the anus and                            advanced to the the cecum, identified by the                            appendiceal orifice, ileocecal valve and palpation.                            The colonoscopy was performed without difficulty.                            The patient tolerated the procedure well. The                            quality of the bowel preparation was adequate. The                            terminal ileum, ileocecal valve, appendiceal                            orifice, and rectum were photographed. Scope In: 10:54:37 AM Scope Out: 11:19:03 AM Scope Withdrawal Time: 0 hours 19 minutes 22 seconds  Total Procedure Duration:  0 hours 24 minutes 26 seconds  Findings:                 Skin tags were found on perianal exam.                           Two sessile polyps were found in the sigmoid colon                            and cecum. The polyps were 2 to 3 mm in size. These                            polyps were removed with a cold biopsy forceps.                            Resection and retrieval were complete. Estimated                            blood loss was minimal.                           A 12 mm polyp was found in the ascending colon. The                            polyp was flat with an adherent mucus cap. The                            polyp was removed with a cold snare. Resection and                            retrieval were complete. Estimated blood loss was                            minimal.                           Non-bleeding internal hemorrhoids were found during                             retroflexion. The hemorrhoids were small.                           The terminal ileum appeared normal. Complications:            No immediate complications. Estimated Blood Loss:     Estimated blood loss was minimal. Impression:               - Perianal skin tags found on perianal exam.                           - Two 2 to 3 mm polyps in the sigmoid colon and in                            the cecum, removed with a cold biopsy forceps.  Resected and retrieved.                           - One 12 mm polyp in the ascending colon, removed                            with a cold snare. Resected and retrieved.                           - Non-bleeding internal hemorrhoids.                           - The examined portion of the ileum was normal. Recommendation:           - Patient has a contact number available for                            emergencies. The signs and symptoms of potential                            delayed complications were discussed with the                            patient. Return to normal activities tomorrow.                            Written discharge instructions were provided to the                            patient.                           - Resume previous diet.                           - Continue present medications.                           - Await pathology results.                           - Repeat colonoscopy in 3 years for surveillance                            based on pathology results and size of the largest                            polyp (>10 mm).                           - Return to GI office PRN.                           - Use fiber, for example Citrucel, Fibercon, [employer]  or Metamucil.                           - Internal hemorrhoids were noted on this study and                            may be amenable to hemorrhoid band ligation. If you                            are  interested in further treatment of these                            hemorrhoids with band ligation, please contact my                            clinic to set up an appointment for evaluation and                            treatment. Gerrit Heck, MD 07/04/2019 11:25:23 AM

## 2019-07-04 NOTE — Progress Notes (Signed)
Temperature taken by Izora Gala, LPN, VS taken by [facility] [place]

## 2019-07-08 ENCOUNTER — Telehealth: Payer: Self-pay

## 2019-07-08 NOTE — Telephone Encounter (Signed)
  Follow up Call-  Call back number 07/04/2019  Post procedure Call Back phone  # [phone]  Permission to leave phone message Yes  Some recent data might be hidden     Patient questions:  Do you have a fever, pain , or abdominal swelling? No. Pain Score  0 *  Have you tolerated food without any problems? Yes.    Have you been able to return to your normal activities? Yes.    Do you have any questions about your discharge instructions: Diet   No. Medications  No. Follow up visit  No.  Do you have questions or concerns about your Care? No.  Actions: * If pain score is 4 or above: No action needed, pain <4. Pt did report yesterday he had a sharpe pain in his lower right side which only lasted 30 seconds.  He said he only reported it because he had a procedure.  I I asked pt if the pain comes back to please call us and he agreed to do so.  1. Have you developed a fever since your procedure? no  2.   Have you had an respiratory symptoms (SOB or cough) since your procedure? no  3.   Have you tested positive for COVID 19 since your procedure no  4.   Have you had any family members/close contacts diagnosed with the COVID 19 since your procedure?  no   If yes to any of these questions please route to Joylene John, RN and Alphonsa Gin, Therapist, sports.

## 2019-07-11 ENCOUNTER — Encounter: Payer: Self-pay | Admitting: Gastroenterology

## 2019-07-16 ENCOUNTER — Ambulatory Visit (INDEPENDENT_AMBULATORY_CARE_PROVIDER_SITE_OTHER): Payer: Medicare Other | Admitting: Osteopathic Medicine

## 2019-07-16 ENCOUNTER — Other Ambulatory Visit: Payer: Self-pay

## 2019-07-16 ENCOUNTER — Encounter: Payer: Self-pay | Admitting: Osteopathic Medicine

## 2019-07-16 VITALS — BP 123/79 | HR 55 | Temp 97.8°F | Wt 246.2 lb

## 2019-07-16 DIAGNOSIS — Z0189 Encounter for other specified special examinations: Secondary | ICD-10-CM | POA: Diagnosis not present

## 2019-07-16 DIAGNOSIS — R252 Cramp and spasm: Secondary | ICD-10-CM | POA: Diagnosis not present

## 2019-07-16 DIAGNOSIS — Z860100 Personal history of colon polyps, unspecified: Secondary | ICD-10-CM

## 2019-07-16 DIAGNOSIS — E1169 Type 2 diabetes mellitus with other specified complication: Secondary | ICD-10-CM | POA: Diagnosis not present

## 2019-07-16 DIAGNOSIS — E119 Type 2 diabetes mellitus without complications: Secondary | ICD-10-CM | POA: Diagnosis not present

## 2019-07-16 DIAGNOSIS — Z1211 Encounter for screening for malignant neoplasm of colon: Secondary | ICD-10-CM | POA: Diagnosis not present

## 2019-07-16 DIAGNOSIS — E785 Hyperlipidemia, unspecified: Secondary | ICD-10-CM

## 2019-07-16 DIAGNOSIS — Z789 Other specified health status: Secondary | ICD-10-CM | POA: Diagnosis not present

## 2019-07-16 DIAGNOSIS — R21 Rash and other nonspecific skin eruption: Secondary | ICD-10-CM

## 2019-07-16 DIAGNOSIS — Z125 Encounter for screening for malignant neoplasm of prostate: Secondary | ICD-10-CM | POA: Diagnosis not present

## 2019-07-16 MED ORDER — CLOBETASOL PROPIONATE 0.05 % EX OINT: 1.0000 "application " | TOPICAL_OINTMENT | Freq: Two times a day (BID) | CUTANEOUS | 1 refills | Status: DC

## 2019-07-16 NOTE — Progress Notes (Signed)
HPI: William Webster is a 68 y.o. male who  has a past medical history of Anterior pituitary adenoma syndrome ([place]), Anxiety, Bimalleolar fracture of right ankle, Colon polyp, Diabetes mellitus without complication ([place]), Diabetic neuropathy ([place]), Family history of cancer of GI tract, Family history of colon cancer, GERD (gastroesophageal reflux disease), Gilbert disease, Hyperlipidemia, Low back pain, Lumbar scoliosis, Prolactinoma ([place]), Sciatica of right side, and Spondylosis of lumbar joint.  he presents to [name] today, 07/16/19,  for chief complaint of:  DM2 follow-up  Others - see headings below    Rash: Reports hyperhidrosis and subsequent rash on his feet. Both feet are affected, on the arches of the feet, a bit worse on the right foot. Similar episode many years ago.  Has been putting peroxide and Vaseline on it.  Rash was present maybe a week ago, only started itching yesterday, reports some blistering.  Muscle cramping: Reports L calf cramp, was unable to walk much at al w/ it. Felt like he might have torn something.  Able to walk normally now but started taking magnesium.  Back pain: was seeing Dr T for this. Strating to keep him up at night   Abdominal pain: Recently underwent colonoscopy, couple of days ago he had some twinging pain in his right lower and right upper quadrant area which resolved spontaneously.  He is eating and drinking normally, no nausea, no blood in stool, no diarrhea or constipation, no abdominal bloating.   DIABETES SCREENING/PREVENTIVE CARE: A1C past 3-6 mos: Yes  controlled? could be better but hasn't been terrible   06/20/18: 7.3%   10/14/18: 6.3% - Glyxambicertainly improved [name], costwas an issue for him in the Medicare donut hole. No hypoglycemia.  01/13/19: 7.5%w/ weight gain 10 lbs since last visit, notes room for improvement diet/exercise.   04/17/19: 7.3% no change to meds, discussed weight loss  / exercise   Today 07/16/19: defers POC test, wants to get serum blood draw w/ other labs  Current antihyperglycemic meds as of 07/16/19:  Metformin 1000 mg bid  Pioglitazone 15 mg daily  Empagliflozin-Linagliptin 25-5 mg daily  BP goal <130/80: Yes   BP Readings from Last 3 Encounters:  07/16/19 123/79  07/04/19 124/71  04/17/19 (!) 142/76   Wt Readings from Last 3 Encounters:  07/16/19 246 lb 3.2 oz (111.7 kg)  07/04/19 245 lb (111.1 kg)  06/20/19 245 lb (111.1 kg)   LDL goal <70: Yes  was 53 as of 05/2018 Eye exam annually: due (last on file 04/2018), importance discussed with patient Foot exam: needs - done last visit  Microalbuminuria:checked   Metformin: Yes  ACE/ARB: Yes  Antiplatelet if ASCVD Risk >10%: needs       At today's visit 07/16/19 ... PMH, PSH, FH reviewed and updated as needed.  Current medication list and allergy/intolerance hx reviewed and updated as needed. (See remainder of HPI, ROS, Phys Exam below)   No results found.  No results found for this or any previous visit (from the past 72 hour(s)).        ASSESSMENT/PLAN: The primary encounter diagnosis was Diabetes mellitus without complication ([place]). Diagnoses of Hyperlipidemia associated with type 2 diabetes mellitus ([place]), History of colon polyps, Rash and nonspecific skin eruption, Takes dietary supplements, and Muscle cramp were also pertinent to this visit.   Patient has lab orders from previous visit, instructed to go get blood drawn.  I added magnesium levels.    Orders Placed This Encounter  Procedures  .  Magnesium     Meds ordered this encounter  Medications  . clobetasol ointment (TEMOVATE) 0.05 %    Sig: Apply 1 application topically 2 (two) times daily. To affected area(s) as needed    Dispense:  15 g    Refill:  1    Patient Instructions  Rash: Looks like inflammatory/allergic reaction to something. Let's try steroid cream applied after shower in evenings (pat  skin dry first) and cover with bandage to keep medication against the skin. Can do this twice per day. Otherwise STOP the peroxide and Vaseline, keep feet dry with fresh socks, can use foot powder.   Labs:  Checking for usual/routine stuff as well as things that might be contributing to muscle cramps:   Abdominal pain: This doesn't sound like anything serious, but if it persists or gets worse, please let me or Dr [name] know.          Follow-up plan: Return in about 3 months (around 10/16/2019) for follow-up diabetes, A1C check. Please call us sooner if needed / can schedule w/ Dr T if needed .                                                 ################################################# ################################################# ################################################# #################################################    Current Meds  Medication Sig  . cabergoline (DOSTINEX) 0.5 MG tablet TAKE 1 TABLET TWICE A WEEK (Patient taking differently: Take 1 tablet twice a week. On Monday and Friday)  . Empagliflozin-linaGLIPtin (GLYXAMBI) 25-5 MG TABS Take 1 tablet by mouth daily.  Marland Kitchen glucose blood test strip Check blood sugar twice daily. Diagnosis DM ICD 10 - E11.9  . lisinopril (ZESTRIL) 2.5 MG tablet TAKE 1 TABLET BY MOUTH EVERY DAY  . magnesium oxide (MAG-OX) 400 MG tablet Take 2 tablets (800 mg total) by mouth at bedtime. (Patient taking differently: Take 400 mg by mouth daily. )  . metFORMIN (GLUCOPHAGE) 1000 MG tablet TAKE 1 TABLET (1,000 MG TOTAL) BY MOUTH 2 (TWO) TIMES DAILY WITH A MEAL.  . Multiple Vitamins-Minerals (CENTRUM MEN PO) Take by mouth daily.  . ONE TOUCH ULTRA TEST test strip CHECK BLOOD SUGAR TWICE DAILY. DIAGNOSIS DM ICD 10 - E11.9  . pioglitazone (ACTOS) 15 MG tablet TAKE 1 TABLET (15 MG TOTAL) BY MOUTH DAILY. DUE FOR FOLLOW UP VISIT W/PCP IN [place]  . rosuvastatin (CRESTOR) 40 MG tablet TAKE 1  TABLET BY MOUTH EVERY DAY   Current Facility-Administered Medications for the 07/16/19 encounter (Office Visit) with Emeterio Reeve, DO  Medication  . 0.9 %  sodium chloride infusion    No Known Allergies     Review of Systems:  Constitutional: No recent illness  HEENT: No  headache, no vision change  Cardiac: No  chest pain, No  pressure, No palpitations  Respiratory:  No  shortness of breath. No  Cough  Gastrointestinal: +abdominal pain, no change on bowel habits  Musculoskeletal: +new myalgia/arthralgia  Skin: +Rash  Hem/Onc: No  easy bruising/bleeding, No  abnormal lumps/bumps  Neurologic: No  weakness, No  Dizziness  Psychiatric: No  concerns with depression, No  concerns with anxiety  Exam:  BP 123/79 (BP Location: Left Arm, Patient Position: Sitting, Cuff Size: Normal)   Pulse (!) 55   Temp 97.8 F (36.6 C) (Oral)   Wt 246 lb 3.2 oz (111.7 kg)   BMI 33.39 kg/m  Constitutional: VS see above. General Appearance: alert, well-developed, well-nourished, NAD  Eyes: Normal lids and conjunctive, non-icteric sclera  Neck: No masses, trachea midline.   Respiratory: Normal respiratory effort.   Musculoskeletal: Gait normal. Symmetric and independent movement of all extremities  Neurological: Normal balance/coordination. No tremor.  Skin: warm, dry, intact.  Erythematous macular papular almost vesicular appearing rash on bilateral arches of the feet, a bit worse on the right.  Psychiatric: Normal judgment/insight. Normal mood and affect. Oriented x3.       Visit summary with medication list and pertinent instructions was printed for patient to review, patient was advised to alert [place] if any updates are needed. All questions at time of visit were answered - patient instructed to contact office with any additional concerns. ER/RTC precautions were reviewed with the patient and understanding verbalized.     Please note: voice recognition software was used  to produce this document, and typos may escape review. Please contact Dr. Sheppard Coil for any needed clarifications.    Follow up plan: Return in about 3 months (around 10/16/2019) for follow-up diabetes, A1C check. Please call us sooner if needed / can schedule w/ Dr T if needed .

## 2019-07-16 NOTE — Patient Instructions (Addendum)
Rash: Looks like inflammatory/allergic reaction to something. Let's try steroid cream applied after shower in evenings (pat skin dry first) and cover with bandage to keep medication against the skin. Can do this twice per day. Otherwise STOP the peroxide and Vaseline, keep feet dry with fresh socks, can use foot powder.   Labs:  Checking for usual/routine stuff as well as things that might be contributing to muscle cramps:   Abdominal pain: This doesn't sound like anything serious, but if it persists or gets worse, please let me or Dr [name] know.

## 2019-07-17 LAB — COMPLETE METABOLIC PANEL WITH GFR
AG Ratio: 2.3 (calc) (ref 1.0–2.5)
ALT: 19 U/L (ref 9–46)
AST: 20 U/L (ref 10–35)
Albumin: 4.6 g/dL (ref 3.6–5.1)
Alkaline phosphatase (APISO): 55 U/L (ref 35–144)
BUN: 19 mg/dL (ref 7–25)
CO2: 27 mmol/L (ref 20–32)
Calcium: 9.5 mg/dL (ref 8.6–10.3)
Chloride: 102 mmol/L (ref 98–110)
Creat: 0.96 mg/dL (ref 0.70–1.25)
GFR, Est African American: 95 mL/min/{1.73_m2} (ref >=60)
GFR, Est Non African American: 82 mL/min/{1.73_m2} (ref >=60)
Globulin: 2 g/dL (calc) (ref 1.9–3.7)
Glucose, Bld: 142 mg/dL — ABNORMAL HIGH (ref 65–99)
Potassium: 4.8 mmol/L (ref 3.5–5.3)
Sodium: 139 mmol/L (ref 135–146)
Total Bilirubin: 1.7 mg/dL — ABNORMAL HIGH (ref 0.2–1.2)
Total Protein: 6.6 g/dL (ref 6.1–8.1)

## 2019-07-17 LAB — CBC
HCT: 44.1 % (ref 38.5–50.0)
Hemoglobin: 15 g/dL (ref 13.2–17.1)
MCH: 29.5 pg (ref 27.0–33.0)
MCHC: 34 g/dL (ref 32.0–36.0)
MCV: 86.6 fL (ref 80.0–100.0)
MPV: 9.9 fL (ref 7.5–12.5)
Platelets: 133 10*3/uL — ABNORMAL LOW (ref 140–400)
RBC: 5.09 10*6/uL (ref 4.20–5.80)
RDW: 13.2 % (ref 11.0–15.0)
WBC: 5.6 10*3/uL (ref 3.8–10.8)

## 2019-07-17 LAB — MICROALBUMIN / CREATININE URINE RATIO
Creatinine, Urine: 62 mg/dL (ref 20–320)
Microalb Creat Ratio: 10 mcg/mg creat (ref <30)
Microalb, Ur: 0.6 mg/dL

## 2019-07-17 LAB — LIPID PANEL
Cholesterol: 150 mg/dL (ref <200)
HDL: 58 mg/dL (ref >=40)
LDL Cholesterol (Calc): 68 mg/dL (calc)
Non-HDL Cholesterol (Calc): 92 mg/dL (calc) (ref <130)
Total CHOL/HDL Ratio: 2.6 (calc) (ref <5.0)
Triglycerides: 153 mg/dL — ABNORMAL HIGH (ref <150)

## 2019-07-17 LAB — HEMOGLOBIN A1C
Hgb A1c MFr Bld: 7.2 % of total Hgb — ABNORMAL HIGH (ref <5.7)
Mean Plasma Glucose: 160 (calc)
eAG (mmol/L): 8.9 (calc)

## 2019-07-17 LAB — MAGNESIUM: Magnesium: 2.2 mg/dL (ref 1.5–2.5)

## 2019-07-17 LAB — PSA, TOTAL WITH REFLEX TO PSA, FREE: PSA, Total: 2.1 ng/mL (ref <=4.0)

## 2019-07-17 LAB — SAR COV2 SEROLOGY (COVID19)AB(IGG),IA: SARS CoV2 AB IGG: NEGATIVE

## 2019-07-20 ENCOUNTER — Other Ambulatory Visit: Payer: Self-pay | Admitting: Osteopathic Medicine

## 2019-07-20 DIAGNOSIS — E119 Type 2 diabetes mellitus without complications: Secondary | ICD-10-CM

## 2019-07-26 ENCOUNTER — Other Ambulatory Visit: Payer: Self-pay | Admitting: Osteopathic Medicine

## 2019-07-26 DIAGNOSIS — Z794 Long term (current) use of insulin: Secondary | ICD-10-CM

## 2019-07-26 DIAGNOSIS — E119 Type 2 diabetes mellitus without complications: Secondary | ICD-10-CM

## 2019-08-27 DIAGNOSIS — Z23 Encounter for immunization: Secondary | ICD-10-CM | POA: Diagnosis not present

## 2019-09-03 ENCOUNTER — Telehealth: Payer: Self-pay | Admitting: Osteopathic Medicine

## 2019-09-03 DIAGNOSIS — E118 Type 2 diabetes mellitus with unspecified complications: Secondary | ICD-10-CM

## 2019-09-03 NOTE — Telephone Encounter (Signed)
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 IF you feel the need to recheck his A1c, just let him know. His Phone [phone]

## 2019-09-04 NOTE — Telephone Encounter (Signed)
I have placed an order if he wants to get A1c checked at the lab, FYI since it has not been 3 months since his last check insurance will probably not cover this testing untill he is due so he might pay out of pocket for this or get billed.

## 2019-09-04 NOTE — Telephone Encounter (Signed)
Called patient. 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 No needs at this time.

## 2019-09-04 NOTE — Addendum Note (Signed)
Addended by: Maryla Morrow on: 09/04/2019 07:16 AM   Modules accepted: Orders

## 2019-10-16 ENCOUNTER — Encounter: Payer: Self-pay | Admitting: Osteopathic Medicine

## 2019-10-16 ENCOUNTER — Other Ambulatory Visit: Payer: Self-pay

## 2019-10-16 ENCOUNTER — Ambulatory Visit (INDEPENDENT_AMBULATORY_CARE_PROVIDER_SITE_OTHER): Payer: Medicare Other | Admitting: Osteopathic Medicine

## 2019-10-16 VITALS — BP 118/70 | HR 54 | Temp 98.4°F | Wt 241.1 lb

## 2019-10-16 DIAGNOSIS — E118 Type 2 diabetes mellitus with unspecified complications: Secondary | ICD-10-CM

## 2019-10-16 LAB — POCT GLYCOSYLATED HEMOGLOBIN (HGB A1C): Hemoglobin A1C: 7.3 % — AB (ref 4.0–5.6)

## 2019-10-16 MED ORDER — EMPAGLIFLOZIN 25 MG PO TABS: 25.0000 mg | ORAL_TABLET | Freq: Every day | ORAL | 0 refills | Status: DC

## 2019-10-16 MED ORDER — LINAGLIPTIN 5 MG PO TABS: 5.0000 mg | ORAL_TABLET | Freq: Every day | ORAL | 0 refills | Status: DC

## 2019-10-16 MED ORDER — CLOTRIMAZOLE-BETAMETHASONE 1-0.05 % EX CREA: 1.0000 "application " | TOPICAL_CREAM | Freq: Two times a day (BID) | CUTANEOUS | 0 refills | Status: DC

## 2019-10-16 NOTE — Progress Notes (Signed)
HPI: William Webster is a 68 y.o. male who  has a past medical history of Anterior pituitary adenoma syndrome ([place]), Anxiety, Bimalleolar fracture of right ankle, Colon polyp, Diabetes mellitus without complication ([place]), Diabetic neuropathy ([place]), Family history of cancer of GI tract, Family history of colon cancer, GERD (gastroesophageal reflux disease), Gilbert disease, Hyperlipidemia, Low back pain, Lumbar scoliosis, Prolactinoma ([place]), Sciatica of right side, and Spondylosis of lumbar joint.  he presents to [facility] today, 10/16/19,  for chief complaint of: follow up diabetes/AIC check, foot rash  Diabetes/A1C - Today's A1C: 7.3 Last A1Cs 7.2, 7.5  - Taking Metformin, Pioglitazone, and Glyxambi.  Patient is doing well. Does not check BG regularly. Diet is okay, will sometimes consume sweets. Walks regularly. Would like to know if there is another option to the Glyxambi.   Foot rash Patient has had a red rash on both feet since June. It has not improved or worsened. Interested in dermatology referral due to its lingering. Non-pruritic but sometimes will bleed and sting. Clobetasol cream didn't help but wife's eczema foam seemed to help. No history of trauma or injury to the area.  Ears Feels like his inner ear is very itchy. Mostly on the right ear, but sometimes on the left. May be related to allergies. Feels like he hears "white noise" often but denies ringing, hearing loss, and dizziness.    Past medical, surgical, social and family history reviewed and updated as necessary.   Current medication list and allergy/intolerance information reviewed:    Current Outpatient Medications  Medication Sig Dispense Refill  . cabergoline (DOSTINEX) 0.5 MG tablet TAKE 1 TABLET TWICE A WEEK (Patient taking differently: Take 1 tablet twice a week. On Monday and Friday) 30 tablet 2  . clobetasol ointment (TEMOVATE) [id number] % Apply 1 application topically 2 (two)  times daily. To affected area(s) as needed 15 g 1  . glucose blood test strip Check blood sugar twice daily. Diagnosis DM ICD 10 - E11.9 200 each prn  . GLYXAMBI 25-5 MG TABS TAKE 1 TABLET BY MOUTH EVERY DAY 90 tablet 3  . lisinopril (ZESTRIL) 2.5 MG tablet TAKE 1 TABLET BY MOUTH EVERY DAY 90 tablet 1  . metFORMIN (GLUCOPHAGE) 1000 MG tablet TAKE 1 TABLET (1,000 MG TOTAL) BY MOUTH 2 (TWO) TIMES DAILY WITH A MEAL. 180 tablet 3  . Multiple Vitamins-Minerals (CENTRUM MEN PO) Take by mouth daily.    . ONE TOUCH ULTRA TEST test strip CHECK BLOOD SUGAR TWICE DAILY. DIAGNOSIS DM ICD 10 - E11.9 100 each 4  . pioglitazone (ACTOS) 15 MG tablet Take 1 tablet (15 mg total) by mouth daily. 30 tablet 2  . rosuvastatin (CRESTOR) 40 MG tablet TAKE 1 TABLET BY MOUTH EVERY DAY 90 tablet 3  . magnesium oxide (MAG-OX) 400 MG tablet Take 2 tablets (800 mg total) by mouth at bedtime. (Patient not taking: Reported on 10/16/2019) 180 tablet 3   Current Facility-Administered Medications  Medication Dose Route Frequency Provider Last Rate Last Dose  . 0.9 %  sodium chloride infusion  500 mL Intravenous Once Cirigliano, Vito V, DO        No Known Allergies    Review of Systems:  Constitutional:  No  fever, no chills, No recent illness.   HEENT: No  headache, no vision change, no hearing change, no tinnitus.  Cardiac: No  chest pain  Respiratory:  No  shortness of breath.   Gastrointestinal: No  abdominal pain  Skin: Rash per  HPI  Neurologic: No  weakness, No  dizziness  Exam:  BP 118/70 (BP Location: Left Arm, Patient Position: Sitting, Cuff Size: Normal)   Pulse (!) 54   Temp 98.4 F (36.9 C) (Oral)   Wt 241 lb 1.9 oz (109.4 kg)   BMI 32.70 kg/m   Constitutional: VS see above. General Appearance: alert, well-developed, well-nourished, NAD  Eyes: Normal lids and conjunctive, non-icteric sclera  Ears: TM normal bilaterally. No evidence of skin issues in canals    Respiratory: Normal respiratory  effort. no wheeze, no rhonchi, no rales  Cardiovascular: S1/S2 normal, no murmur, no rub/gallop auscultated. RRR. No lower extremity edema.   Gastrointestinal: Nontender, no masses. Bowel sounds normal.  Musculoskeletal: Gait normal.   Neurological: Normal balance/coordination. No tremor.   Skin: warm, dry, intact. Erythematous, papular grouped rash on the dorsolateral aspect of the right foot and the dorsomedial aspect of the left foot.   Psychiatric: Normal judgment/insight. Normal mood and affect.   Results for orders placed or performed in visit on 10/16/19 (from the past 72 hour(s))  POCT HgB A1C     Status: Abnormal   Collection Time: 10/16/19  9:00 AM  Result Value Ref Range   Hemoglobin A1C 7.3 (A) 4.0 - 5.6 %   HbA1c POC (<> result, manual entry)     HbA1c, POC (prediabetic range)     HbA1c, POC (controlled diabetic range)      No results found.   ASSESSMENT/PLAN: The encounter diagnosis was Controlled type 2 diabetes mellitus with complication, without long-term current use of insulin ([place]).   Diabetes - A1C is stable today. Will change Glyxambi to Jardiance and Tradjenta per patient request (for now). Plan to follow up in 3 months for diabetes/A1C recheck. Consider SGLT2I if A1C dramatically worse   Red Rash - Start Lotrisone for likely fungal infection, steroid to help itching, seems likely wife's Rx is steroid   Ear Pruritis - No concerning findings on exam. Recommended mineral oil as needed in both ears, could try Debrox.   Meds ordered this encounter  Medications  . empagliflozin (JARDIANCE) 25 MG TABS tablet    Sig: Take 25 mg by mouth daily.    Dispense:  90 tablet    Refill:  0  . linagliptin (TRADJENTA) 5 MG TABS tablet    Sig: Take 1 tablet (5 mg total) by mouth daily.    Dispense:  90 tablet    Refill:  0  . clotrimazole-betamethasone (LOTRISONE) cream    Sig: Apply 1 application topically 2 (two) times daily.    Dispense:  30 g    Refill:  0     Orders Placed This Encounter  Procedures  . POCT HgB A1C         Visit summary with medication list and pertinent instructions was printed for patient to review. All questions at time of visit were answered - patient instructed to contact office with any additional concerns or updates. ER/RTC precautions were reviewed with the patient.   Follow-up plan: Return in about 3 months (around 01/16/2020) for recheck A1C, see [place] sooner if needed! .  Note: Total time spent 25 minutes, greater than 50% of the visit was spent face-to-face counseling and coordinating care for the following: The encounter diagnosis was Controlled type 2 diabetes mellitus with complication, without long-term current use of insulin ([place])..  Please note: voice recognition software was used to produce this document, and typos may escape review. Please contact Dr. Sheppard Coil for any needed  clarifications.

## 2019-10-17 ENCOUNTER — Telehealth: Payer: Self-pay

## 2019-10-17 NOTE — Telephone Encounter (Signed)
Pt left a vm msg stating he is unable to afford Trajenta and Jardiance rxs. OOP costs for both meds is over $200. Pt is requesting to continue taking Glyxambi rx. If appropriate, pls send rx to the pharmacy. Thanks.

## 2019-10-20 NOTE — Telephone Encounter (Signed)
Pt has been updated and has been notified by his pharmacy. No other inquiries during call.

## 2019-10-20 NOTE — Telephone Encounter (Signed)
Is worth of the Glyxambi was sent to pharmacy in July, he should let the pharmacy know he needs a refill but they should have the order on file

## 2019-11-17 ENCOUNTER — Other Ambulatory Visit: Payer: Self-pay

## 2019-11-19 ENCOUNTER — Other Ambulatory Visit: Payer: Self-pay | Admitting: Osteopathic Medicine

## 2019-11-19 DIAGNOSIS — E119 Type 2 diabetes mellitus without complications: Secondary | ICD-10-CM

## 2019-12-12 ENCOUNTER — Other Ambulatory Visit: Payer: Self-pay | Admitting: Osteopathic Medicine

## 2019-12-12 DIAGNOSIS — E221 Hyperprolactinemia: Secondary | ICD-10-CM

## 2019-12-12 NOTE — Telephone Encounter (Signed)
Forwarding medication refill request to the clinical pool for review. 

## 2019-12-25 ENCOUNTER — Other Ambulatory Visit: Payer: Self-pay | Admitting: Osteopathic Medicine

## 2019-12-25 NOTE — Telephone Encounter (Signed)
Forwarding medication refill request to the clinical pool for review. 

## 2020-01-21 ENCOUNTER — Other Ambulatory Visit: Payer: Self-pay

## 2020-01-21 ENCOUNTER — Encounter: Payer: Self-pay | Admitting: Osteopathic Medicine

## 2020-01-21 ENCOUNTER — Ambulatory Visit (INDEPENDENT_AMBULATORY_CARE_PROVIDER_SITE_OTHER): Payer: Medicare Other | Admitting: Osteopathic Medicine

## 2020-01-21 VITALS — BP 129/75 | HR 61 | Temp 98.0°F | Wt 248.7 lb

## 2020-01-21 DIAGNOSIS — R233 Spontaneous ecchymoses: Secondary | ICD-10-CM | POA: Diagnosis not present

## 2020-01-21 DIAGNOSIS — M545 Low back pain, unspecified: Secondary | ICD-10-CM

## 2020-01-21 DIAGNOSIS — G8929 Other chronic pain: Secondary | ICD-10-CM | POA: Diagnosis not present

## 2020-01-21 DIAGNOSIS — E118 Type 2 diabetes mellitus with unspecified complications: Secondary | ICD-10-CM | POA: Diagnosis not present

## 2020-01-21 LAB — POCT GLYCOSYLATED HEMOGLOBIN (HGB A1C): Hemoglobin A1C: 7.4 % — AB (ref 4.0–5.6)

## 2020-01-21 NOTE — Progress Notes (Signed)
William [facility] is a 69 y.o. male who presents to  [facility]  today, 01/21/20, seeking care for the following:  The primary encounter diagnosis was Controlled type 2 diabetes mellitus with complication, without long-term current use of insulin ([facility]). Diagnoses of Petechial eruption and Chronic bilateral low back pain without sciatica were also pertinent to this visit.     ASSESSMENT & PLAN with other pertinent history/findings:   1. Controlled type 2 diabetes mellitus with complication, without long-term current use of insulin ([facility]) A1C today 7.4 stable from previous, medications not changed from last visit.  Reports he might be able to do a little bit better in terms of diet/exercise.  Glyxambi has been expensive, he has a Research officer, trade union and will discuss alternative medications with his pharmacist.  Due to CDL, very reluctant to get on insulin but A1c is not terrible, would work on diet/exercise and weight loss  2. Petechial eruption Small area on dorsum of right foot, no trauma, he has had history of surgical procedures on that foot.  No significant pain, I think we can continue to monitor this.  3. Chronic bilateral low back pain without sciatica Discussed ergonomic changes he can make it work in terms of lumbar support     Orders Placed This Encounter  Procedures  . POCT HgB A1C    No orders of the defined types were placed in this encounter.   There are no Patient Instructions on file for this visit.    Follow-up instructions: Return in about 3 months (around 04/20/2020) for follow up diabetes .      BP 129/75 (BP Location: Right Arm, Patient Position: Sitting, Cuff Size: Large)   Pulse 61   Temp 98 F (36.7 C) (Oral)   Wt 248 lb 11.2 oz (112.8 kg)   BMI 33.73 kg/m   Current Meds  Medication Sig  . cabergoline ([facility]) 0.5 MG tablet TAKE 1 TABLET TWICE WEEKLY  .  clotrimazole-betamethasone ([facility]) cream Apply 1 application topically 2 (two) times daily.  Marland Kitchen glucose blood test strip Check blood sugar twice daily. Diagnosis DM ICD 10 - E11.9  . GLYXAMBI 25-5 MG TABS TAKE 1 TABLET BY MOUTH EVERY DAY  . lisinopril (ZESTRIL) 2.5 MG tablet TAKE 1 TABLET BY MOUTH EVERY DAY  . metFORMIN (GLUCOPHAGE) 1000 MG tablet TAKE 1 TABLET (1,000 MG TOTAL) BY MOUTH 2 (TWO) TIMES DAILY WITH A MEAL.  . Multiple Vitamins-Minerals ([facility]) Take by mouth daily.  . [facility] TEST test strip CHECK BLOOD SUGAR TWICE DAILY. DIAGNOSIS DM ICD 10 - E11.9  . pioglitazone (ACTOS) 15 MG tablet TAKE 1 TABLET BY MOUTH EVERY DAY  . rosuvastatin (CRESTOR) 40 MG tablet TAKE 1 TABLET BY MOUTH EVERY DAY   Current Facility-Administered Medications for the 01/21/20 encounter (Office Visit) with William Reeve, DO  Medication  . 0.9 %  sodium chloride infusion    Results for orders placed or performed in visit on 01/21/20 (from the past 72 hour(s))  POCT HgB A1C     Status: Abnormal   Collection Time: 01/21/20  9:00 AM  Result Value Ref Range   Hemoglobin A1C 7.4 (A) 4.0 - 5.6 %   HbA1c POC (<> result, manual entry)     HbA1c, POC (prediabetic range)     HbA1c, POC (controlled diabetic range)      No results found.  Depression screen [facility] 2/9 10/16/2019 01/13/2019 06/20/2018  Decreased Interest 0  0 0  Down, Depressed, Hopeless 0 0 0  PHQ - 2 Score 0 0 0  Altered sleeping - - 0  Tired, decreased energy - - 0  Change in appetite - - 0  Feeling bad or failure about yourself  - - 0  Trouble concentrating - - 0  Moving slowly or fidgety/restless - - 0  Suicidal thoughts - - 0  PHQ-9 Score - - 0  Difficult doing work/chores - - Not difficult at all    GAD 7 : Generalized Anxiety Score 10/16/2019 01/13/2019 06/20/2018  Nervous, Anxious, on Edge 0 0 0  Control/stop worrying 0 0 0  Worry too much - different things 0 0 0  Trouble relaxing 0 0 0  Restless 0 0 0   Easily annoyed or irritable 0 0 0  Afraid - awful might happen 0 0 0  Total GAD 7 Score 0 0 0  Anxiety Difficulty - - Not difficult at all      All questions at time of visit were answered - patient instructed to contact office with any additional concerns or updates.  ER/RTC precautions were reviewed with the patient.  Please note: voice recognition software was used to produce this document, and typos may escape review. Please contact Dr. Sheppard Coil for any needed clarifications.

## 2020-02-14 ENCOUNTER — Other Ambulatory Visit: Payer: Self-pay | Admitting: Osteopathic Medicine

## 2020-02-14 DIAGNOSIS — E119 Type 2 diabetes mellitus without complications: Secondary | ICD-10-CM

## 2020-02-14 DIAGNOSIS — Z794 Long term (current) use of insulin: Secondary | ICD-10-CM

## 2020-03-01 DIAGNOSIS — Z23 Encounter for immunization: Secondary | ICD-10-CM | POA: Diagnosis not present

## 2020-03-22 DIAGNOSIS — Z23 Encounter for immunization: Secondary | ICD-10-CM | POA: Diagnosis not present

## 2020-04-21 ENCOUNTER — Ambulatory Visit (INDEPENDENT_AMBULATORY_CARE_PROVIDER_SITE_OTHER): Payer: Medicare Other | Admitting: Osteopathic Medicine

## 2020-04-21 ENCOUNTER — Encounter: Payer: Self-pay | Admitting: Osteopathic Medicine

## 2020-04-21 VITALS — BP 130/78 | HR 54 | Temp 97.7°F | Wt 248.0 lb

## 2020-04-21 DIAGNOSIS — E118 Type 2 diabetes mellitus with unspecified complications: Secondary | ICD-10-CM | POA: Diagnosis not present

## 2020-04-21 LAB — POCT GLYCOSYLATED HEMOGLOBIN (HGB A1C): Hemoglobin A1C: 7.5 % — AB (ref 4.0–5.6)

## 2020-04-21 NOTE — Progress Notes (Signed)
William [facility] is a 69 y.o. male who presents to  [place] at [facility]  today, 04/21/20, seeking care for the following:  The encounter diagnosis was Controlled type 2 diabetes mellitus with complication, without long-term current use of insulin ([place]).     ASSESSMENT & PLAN with other pertinent history/findings:   1. Controlled type 2 diabetes mellitus with complication, without long-term current use of insulin ([place]) Today 04/21/20 - 7.5% plans on biking and walking to work, cutting back on snacks. A1C last visit 7.4 stable from previous, medications not changed. Pt stated he might be able to do a little bit better in terms of diet/exercise.  Glyxambi has been expensive, he has a Research officer, trade union and will discuss alternative medications with his pharmacist.  Due to CDL, very reluctant to get on insulin but A1c is not terrible, advised work on diet/exercise and weight loss.      Orders Placed This Encounter  Procedures  . POCT HgB A1C    No orders of the defined types were placed in this encounter.   There are no Patient Instructions on file for this visit.    Follow-up instructions: Return in about 3 months (around 07/21/2020) for follow up diabetes - see [place] sooner if needed .      BP 130/78 (BP Location: Left Arm, Patient Position: Sitting, Cuff Size: Normal)   Pulse (!) 54   Temp 97.7 F (36.5 C) (Oral)   Wt 248 lb 0.6 oz (112.5 kg)   BMI 33.64 kg/m   Current Meds  Medication Sig  . cabergoline (DOSTINEX) 0.5 MG tablet TAKE 1 TABLET TWICE WEEKLY  . clobetasol ointment (TEMOVATE) [id number] % Apply 1 application topically 2 (two) times daily. To affected area(s) as needed  . clotrimazole-betamethasone (LOTRISONE) cream Apply 1 application topically 2 (two) times daily.  Marland Kitchen glucose blood test strip Check blood sugar twice daily. Diagnosis DM ICD 10 - E11.9  . GLYXAMBI 25-5 MG TABS TAKE 1 TABLET BY MOUTH EVERY DAY   . lisinopril (ZESTRIL) 2.5 MG tablet TAKE 1 TABLET BY MOUTH EVERY DAY  . magnesium oxide (MAG-OX) 400 MG tablet Take 2 tablets (800 mg total) by mouth at bedtime.  . metFORMIN (GLUCOPHAGE) 1000 MG tablet TAKE 1 TABLET (1,000 MG TOTAL) BY MOUTH 2 (TWO) TIMES DAILY WITH A MEAL.  . Multiple Vitamins-Minerals (CENTRUM MEN PO) Take by mouth daily.  . ONE TOUCH ULTRA TEST test strip CHECK BLOOD SUGAR TWICE DAILY. DIAGNOSIS DM ICD 10 - E11.9  . pioglitazone (ACTOS) 15 MG tablet TAKE 1 TABLET BY MOUTH EVERY DAY  . rosuvastatin (CRESTOR) 40 MG tablet TAKE 1 TABLET BY MOUTH EVERY DAY   Current Facility-Administered Medications for the 04/21/20 encounter (Office Visit) with Emeterio Reeve, DO  Medication  . 0.9 %  sodium chloride infusion    Results for orders placed or performed in visit on 04/21/20 (from the past 72 hour(s))  POCT HgB A1C     Status: Abnormal   Collection Time: 04/21/20  8:31 AM  Result Value Ref Range   Hemoglobin A1C 7.5 (A) 4.0 - 5.6 %   HbA1c POC (<> result, manual entry)     HbA1c, POC (prediabetic range)     HbA1c, POC (controlled diabetic range)      No results found.  Depression screen [facility] 2/9 04/21/2020 10/16/2019 01/13/2019  Decreased Interest 0 0 0  Down, Depressed, Hopeless 0 0 0  PHQ - 2 Score 0 0 0  Altered sleeping 0 - -  Tired, decreased energy 0 - -  Change in appetite 0 - -  Feeling bad or failure about yourself  0 - -  Trouble concentrating 0 - -  Moving slowly or fidgety/restless 0 - -  Suicidal thoughts 0 - -  PHQ-9 Score 0 - -  Difficult doing work/chores Not difficult at all - -    GAD 7 : Generalized Anxiety Score 04/21/2020 10/16/2019 01/13/2019 06/20/2018  Nervous, Anxious, on Edge 0 0 0 0  Control/stop worrying 0 0 0 0  Worry too much - different things 0 0 0 0  Trouble relaxing 0 0 0 0  Restless 0 0 0 0  Easily annoyed or irritable 0 0 0 0  Afraid - awful might happen 0 0 0 0  Total GAD 7 Score 0 0 0 0  Anxiety Difficulty Not  difficult at all - - Not difficult at all      All questions at time of visit were answered - patient instructed to contact office with any additional concerns or updates.  ER/RTC precautions were reviewed with the patient.  Please note: voice recognition software was used to produce this document, and typos may escape review. Please contact Dr. Sheppard Coil for any needed clarifications.

## 2020-05-10 ENCOUNTER — Other Ambulatory Visit: Payer: Self-pay | Admitting: Family Medicine

## 2020-05-10 DIAGNOSIS — Z794 Long term (current) use of insulin: Secondary | ICD-10-CM

## 2020-05-10 DIAGNOSIS — E119 Type 2 diabetes mellitus without complications: Secondary | ICD-10-CM

## 2020-05-11 ENCOUNTER — Other Ambulatory Visit: Payer: Self-pay | Admitting: Osteopathic Medicine

## 2020-05-11 DIAGNOSIS — E119 Type 2 diabetes mellitus without complications: Secondary | ICD-10-CM

## 2020-05-11 NOTE — Telephone Encounter (Signed)
NA-Plz see refill req/thx dmf 

## 2020-06-11 DIAGNOSIS — E119 Type 2 diabetes mellitus without complications: Secondary | ICD-10-CM | POA: Diagnosis not present

## 2020-06-11 DIAGNOSIS — H524 Presbyopia: Secondary | ICD-10-CM | POA: Diagnosis not present

## 2020-06-11 DIAGNOSIS — Z7984 Long term (current) use of oral hypoglycemic drugs: Secondary | ICD-10-CM | POA: Diagnosis not present

## 2020-06-11 DIAGNOSIS — H5203 Hypermetropia, bilateral: Secondary | ICD-10-CM | POA: Diagnosis not present

## 2020-06-11 LAB — HM DIABETES EYE EXAM

## 2020-06-17 ENCOUNTER — Other Ambulatory Visit: Payer: Self-pay | Admitting: Osteopathic Medicine

## 2020-06-17 DIAGNOSIS — E119 Type 2 diabetes mellitus without complications: Secondary | ICD-10-CM

## 2020-06-22 ENCOUNTER — Other Ambulatory Visit: Payer: Self-pay | Admitting: Osteopathic Medicine

## 2020-07-16 ENCOUNTER — Other Ambulatory Visit: Payer: Self-pay | Admitting: Osteopathic Medicine

## 2020-07-16 DIAGNOSIS — E119 Type 2 diabetes mellitus without complications: Secondary | ICD-10-CM

## 2020-07-22 ENCOUNTER — Other Ambulatory Visit: Payer: Self-pay

## 2020-07-22 ENCOUNTER — Ambulatory Visit (INDEPENDENT_AMBULATORY_CARE_PROVIDER_SITE_OTHER): Payer: Medicare Other | Admitting: Osteopathic Medicine

## 2020-07-22 ENCOUNTER — Encounter: Payer: Self-pay | Admitting: Osteopathic Medicine

## 2020-07-22 VITALS — BP 117/64 | HR 65 | Temp 98.0°F | Wt 246.0 lb

## 2020-07-22 DIAGNOSIS — E118 Type 2 diabetes mellitus with unspecified complications: Secondary | ICD-10-CM

## 2020-07-22 LAB — POCT GLYCOSYLATED HEMOGLOBIN (HGB A1C)
HbA1c POC (<> result, manual entry): 7.8 % (ref 4.0–5.6)
HbA1c, POC (controlled diabetic range): 7.8 % — AB (ref 0.0–7.0)
HbA1c, POC (prediabetic range): 7.8 % — AB (ref 5.7–6.4)
Hemoglobin A1C: 7.8 % — AB (ref 4.0–5.6)

## 2020-07-22 NOTE — Progress Notes (Signed)
[facility] is a 69 y.o. male who presents to  [place] at [facility]  today, 07/22/20, seeking care for the following:  The encounter diagnosis was Controlled type 2 diabetes mellitus with complication, without long-term current use of insulin ([place]).     ASSESSMENT & PLAN with other pertinent history/findings:   1. Uncontrolled type 2 diabetes mellitus with complication, without long-term current use of insulin ([place]) Today 07/22/20 A1C creeping up a bit to 7.8% Last visit 04/21/20 - 7.5% plans on biking and walking to work, cutting back on snacks. A1C prior to that was 7.4 stable from previous, medications not changed. Pt stated he might be able to do a little bit better in terms of diet/exercise.  Glyxambi has been expensive, he has a Research officer, trade union and will discuss alternative medications with his pharmacist.  Due to CDL, very reluctant to get on insulin but A1c is not terrible, advised work on diet/exercise and weight loss.  Has been drinkig a few more beers at the pool...    Wt Readings from Last 3 Encounters:  07/22/20 (!) 246 lb (111.6 kg)  04/21/20 248 lb 0.6 oz (112.5 kg)  01/21/20 248 lb 11.2 oz (112.8 kg)    Orders Placed This Encounter  Procedures  . POCT HgB A1C    No orders of the defined types were placed in this encounter.   There are no Patient Instructions on file for this visit.    Follow-up instructions: Return in about 3 months (around 10/22/2020).      BP (!) 117/64 (BP Location: Left Arm, Patient Position: Sitting)   Pulse 65   Temp 98 F (36.7 C)   Wt (!) 246 lb (111.6 kg)   SpO2 100%   BMI 33.36 kg/m   Current Meds  Medication Sig  . cabergoline (DOSTINEX) 0.5 MG tablet TAKE 1 TABLET TWICE WEEKLY  . clobetasol ointment (TEMOVATE) 9.79 % Apply 1 application topically 2 (two) times daily. To affected area(s) as needed  . clotrimazole-betamethasone (LOTRISONE) cream Apply 1  application topically 2 (two) times daily.  Marland Kitchen glucose blood test strip Check blood sugar twice daily. Diagnosis DM ICD 10 - E11.9  . GLYXAMBI 25-5 MG TABS TAKE 1 TABLET BY MOUTH EVERY DAY  . lisinopril (ZESTRIL) 2.5 MG tablet TAKE 1 TABLET BY MOUTH EVERY DAY  . magnesium oxide (MAG-OX) 400 MG tablet Take 2 tablets (800 mg total) by mouth at bedtime.  . metFORMIN (GLUCOPHAGE) 1000 MG tablet TAKE 1 TABLET (1,000 MG TOTAL) BY MOUTH 2 (TWO) TIMES DAILY WITH A MEAL.  . Multiple Vitamins-Minerals (CENTRUM MEN PO) Take by mouth daily.  . ONE TOUCH ULTRA TEST test strip CHECK BLOOD SUGAR TWICE DAILY. DIAGNOSIS DM ICD 10 - E11.9  . pioglitazone (ACTOS) 15 MG tablet TAKE 1 TABLET BY MOUTH EVERY DAY  . rosuvastatin (CRESTOR) 40 MG tablet Take 1 tablet (40 mg total) by mouth daily. Need labs   Current Facility-Administered Medications for the 07/22/20 encounter (Office Visit) with Emeterio Reeve, DO  Medication  . 0.9 %  sodium chloride infusion    Results for orders placed or performed in visit on 07/22/20 (from the past 72 hour(s))  POCT HgB A1C     Status: Abnormal   Collection Time: 07/22/20  7:32 AM  Result Value Ref Range   Hemoglobin A1C 7.8 (A) 4.0 - 5.6 %   HbA1c POC (<> result, manual entry) 7.8 4.0 - 5.6 %   HbA1c, POC (  prediabetic range) 7.8 (A) 5.7 - 6.4 %   HbA1c, POC (controlled diabetic range) 7.8 (A) 0.0 - 7.0 %    No results found.  Depression screen [facility] 2/9 04/21/2020 10/16/2019 01/13/2019  Decreased Interest 0 0 0  Down, Depressed, Hopeless 0 0 0  PHQ - 2 Score 0 0 0  Altered sleeping 0 - -  Tired, decreased energy 0 - -  Change in appetite 0 - -  Feeling bad or failure about yourself  0 - -  Trouble concentrating 0 - -  Moving slowly or fidgety/restless 0 - -  Suicidal thoughts 0 - -  PHQ-9 Score 0 - -  Difficult doing work/chores Not difficult at all - -    GAD 7 : Generalized Anxiety Score 04/21/2020 10/16/2019 01/13/2019 06/20/2018  Nervous, Anxious, on Edge 0 0  0 0  Control/stop worrying 0 0 0 0  Worry too much - different things 0 0 0 0  Trouble relaxing 0 0 0 0  Restless 0 0 0 0  Easily annoyed or irritable 0 0 0 0  Afraid - awful might happen 0 0 0 0  Total GAD 7 Score 0 0 0 0  Anxiety Difficulty Not difficult at all - - Not difficult at all      All questions at time of visit were answered - patient instructed to contact office with any additional concerns or updates.  ER/RTC precautions were reviewed with the patient.  Please note: voice recognition software was used to produce this document, and typos may escape review. Please contact Dr. Sheppard Coil for any needed clarifications.

## 2020-07-28 ENCOUNTER — Other Ambulatory Visit: Payer: Self-pay | Admitting: Osteopathic Medicine

## 2020-07-28 DIAGNOSIS — E119 Type 2 diabetes mellitus without complications: Secondary | ICD-10-CM

## 2020-08-02 ENCOUNTER — Other Ambulatory Visit: Payer: Self-pay | Admitting: Osteopathic Medicine

## 2020-08-02 ENCOUNTER — Other Ambulatory Visit: Payer: Self-pay | Admitting: Medical-Surgical

## 2020-08-02 DIAGNOSIS — E119 Type 2 diabetes mellitus without complications: Secondary | ICD-10-CM

## 2020-08-02 DIAGNOSIS — E221 Hyperprolactinemia: Secondary | ICD-10-CM

## 2020-08-02 NOTE — Telephone Encounter (Signed)
Routing to PCP

## 2020-08-13 DIAGNOSIS — Z23 Encounter for immunization: Secondary | ICD-10-CM | POA: Diagnosis not present

## 2020-10-14 DIAGNOSIS — Z23 Encounter for immunization: Secondary | ICD-10-CM | POA: Diagnosis not present

## 2020-10-21 ENCOUNTER — Encounter: Payer: Self-pay | Admitting: Osteopathic Medicine

## 2020-10-21 ENCOUNTER — Other Ambulatory Visit: Payer: Self-pay

## 2020-10-21 ENCOUNTER — Ambulatory Visit (INDEPENDENT_AMBULATORY_CARE_PROVIDER_SITE_OTHER): Payer: Medicare Other | Admitting: Osteopathic Medicine

## 2020-10-21 VITALS — BP 122/71 | HR 62 | Temp 97.8°F | Wt 252.3 lb

## 2020-10-21 DIAGNOSIS — E119 Type 2 diabetes mellitus without complications: Secondary | ICD-10-CM

## 2020-10-21 DIAGNOSIS — I10 Essential (primary) hypertension: Secondary | ICD-10-CM | POA: Diagnosis not present

## 2020-10-21 DIAGNOSIS — Z0000 Encounter for general adult medical examination without abnormal findings: Secondary | ICD-10-CM

## 2020-10-21 LAB — POCT GLYCOSYLATED HEMOGLOBIN (HGB A1C): Hemoglobin A1C: 8.1 % — AB (ref 4.0–5.6)

## 2020-10-21 NOTE — Progress Notes (Signed)
HPI: William Webster is a 69 y.o. male who  has a past medical history of Anterior pituitary adenoma syndrome ([place]), Anxiety, Bimalleolar fracture of right ankle, Colon polyp, Diabetes mellitus without complication ([place]), Diabetic neuropathy ([place]), Family history of cancer of GI tract, Family history of colon cancer, GERD (gastroesophageal reflux disease), Gilbert disease, Hyperlipidemia, Low back pain, Lumbar scoliosis, Prolactinoma ([place]), Sciatica of right side, and Spondylosis of lumbar joint.  he presents to [facility] today, 10/21/20,  for chief complaint of:  Diabetes Check   Been working out for about a month, riding bike more. Working on diet. Wants to watch carbs. Glyxambi still expensive, due to high insurance deductible. Not interested in insulin.   A1C increased, reviewed trends over past 2 years w/ patient   BP at goal     Past medical, surgical, social and family history reviewed:  Patient Active Problem List   Diagnosis Date Noted  . Bilateral shoulder pain 09/16/2018  . Fracture of clavicle, left, closed 08/01/2018  . Screening for colon cancer 12/10/2015  . Fracture of ankle, bimalleolar, right, closed 11/22/2015  . Pituitary adenoma ([place]) 08/17/2015  . Hyperprolactinemia ([place]) 08/17/2015  . Obesity 07/15/2015  . Spondylosis of lumbar region without myelopathy or radiculopathy 07/15/2015  . Primary osteoarthritis of both knees 07/15/2015  . Right cervical radiculopathy 01/07/2015  . HLD (hyperlipidemia) 01/05/2014  . Diabetes mellitus type 2, controlled, without complications ([place]) 65/68/1275    Past Surgical History:  Procedure Laterality Date  . COLONOSCOPY  2012   [place]  . ESOPHAGOGASTRODUODENOSCOPY     With [facility]-Said he had a bad case of reflux-said it been a few years. Maybe about 10 years or 11 years   . HEMORRHOID SURGERY    . ORIF ANKLE FRACTURE Right 11/24/2015   Procedure: OPEN REDUCTION INTERNAL  FIXATION (ORIF) ANKLE FRACTURE;  Surgeon: Dorna Leitz, MD;  Location: [place];  Service: Orthopedics;  Laterality: Right;  Open reduction internal fixation right ankle fracture  . POLYPECTOMY    . TONSILLECTOMY      Social History   Tobacco Use  . Smoking status: Current Some Day Smoker    Types: Cigars  . Smokeless tobacco: Never Used  . Tobacco comment: occassional cigar-every month   Substance Use Topics  . Alcohol use: Yes    Alcohol/week: 10.0 standard drinks    Types: 10 Standard drinks or equivalent per week    Comment: social 12-20 drinks a week    Family History  Problem Relation Age of Onset  . Heart failure Mother   . Diabetes Mother   . Hyperlipidemia Mother   . Stroke Mother   . Colon cancer Mother   . Stroke Father   . Hyperlipidemia Father   . Diabetes Maternal Grandfather   . Heart disease Maternal Grandfather   . Stroke Maternal Grandfather   . Diabetes Paternal Grandfather   . Heart disease Paternal Grandfather   . Stroke Paternal Grandfather   . Esophageal cancer Neg Hx   . Stomach cancer Neg Hx   . Rectal cancer Neg Hx      Current medication list and allergy/intolerance information reviewed:    Current Outpatient Medications  Medication Sig Dispense Refill  . cabergoline (DOSTINEX) 0.5 MG tablet TAKE 1 TABLET TWICE WEEKLY 30 tablet 2  . clobetasol ointment (TEMOVATE) 1.70 % Apply 1 application topically 2 (two) times daily. To affected area(s) as needed 15 g 1  . clotrimazole-betamethasone (LOTRISONE) cream Apply 1  application topically 2 (two) times daily. 30 g 0  . glucose blood test strip Check blood sugar twice daily. Diagnosis DM ICD 10 - E11.9 200 each prn  . GLYXAMBI 25-5 MG TABS TAKE 1 TABLET BY MOUTH EVERY DAY 90 tablet 3  . lisinopril (ZESTRIL) 2.5 MG tablet TAKE 1 TABLET BY MOUTH EVERY DAY 90 tablet 1  . magnesium oxide (MAG-OX) 400 MG tablet Take 2 tablets (800 mg total) by mouth at bedtime. 180 tablet 3  . metFORMIN  (GLUCOPHAGE) 1000 MG tablet TAKE 1 TABLET (1,000 MG TOTAL) BY MOUTH 2 (TWO) TIMES DAILY WITH A MEAL. 180 tablet 3  . Multiple Vitamins-Minerals (CENTRUM MEN PO) Take by mouth daily.    . ONE TOUCH ULTRA TEST test strip CHECK BLOOD SUGAR TWICE DAILY. DIAGNOSIS DM ICD 10 - E11.9 100 each 4  . pioglitazone (ACTOS) 15 MG tablet TAKE 1 TABLET BY MOUTH EVERY DAY 90 tablet 3  . rosuvastatin (CRESTOR) 40 MG tablet TAKE 1 TABLET (40 MG TOTAL) BY MOUTH DAILY. NEED LABS 90 tablet 0   Current Facility-Administered Medications  Medication Dose Route Frequency Provider Last Rate Last Admin  . 0.9 %  sodium chloride infusion  500 mL Intravenous Once Cirigliano, Vito V, DO        No Known Allergies    Review of Systems:  General: No recent illnesses  Neurologic: No numbness or tingling   Exam:  BP 122/71 (BP Location: Left Arm, Patient Position: Sitting, Cuff Size: Large)   Pulse 62   Temp 97.8 F (36.6 C) (Oral)   Wt 252 lb 4.8 oz (114.4 kg)   BMI 34.22 kg/m   Constitutional: VS see above. General Appearance: alert, well-developed, well-nourished, NAD  Respiratory: Normal respiratory effort. no wheeze, no rhonchi, no rales  Cardiovascular: S1/S2 normal, no murmur, no rub/gallop auscultated. RRR.   Skin: warm, dry, intact.    Results for orders placed or performed in visit on 10/21/20 (from the past 72 hour(s))  POCT HgB A1C     Status: Abnormal   Collection Time: 10/21/20  7:22 AM  Result Value Ref Range   Hemoglobin A1C 8.1 (A) 4.0 - 5.6 %   HbA1c POC (<> result, manual entry)     HbA1c, POC (prediabetic range)     HbA1c, POC (controlled diabetic range)      No results found.   ASSESSMENT/PLAN: The primary encounter diagnosis was Diabetes mellitus without complication ([place]). Diagnoses of Annual physical exam and Essential hypertension were also pertinent to this visit.   Annual physical exam due   Ordered routine labs for annual physical to be completed prior to annual in  approximately 3 months   type 2 diabetes  A1C today 10/21/2020 8.1% . Was 7.8% on 07/22/2020, and prior to that was stable at 7.4%. 2 year trend shows gradual increase in A1C. Discussed exercise and diet management, focused on reducing carbs and increasing fresh vegetables and fiber. Plans to to reduce carb intake and increase exercise. No medication changes at this time. Discussed potential need to change medications if A1C continue to trend up.   Labs ordered for future visit. Annual physical / preventive care was NOT performed or billed today.    Orders Placed This Encounter  Procedures  . CBC  . COMPLETE METABOLIC PANEL WITH GFR  . Lipid panel  . Hemoglobin A1c  . PSA, Total with Reflex to PSA, Free  . POCT HgB A1C    No orders of the defined types were  placed in this encounter.   Patient Instructions   A1C trending up :( See attached  Work on diet/exercise to avoid needing more medications!   Wt Readings from Last 3 Encounters:  10/21/20 252 lb 4.8 oz (114.4 kg)  07/22/20 (!) 246 lb (111.6 kg)  04/21/20 248 lb 0.6 oz (112.5 kg)     BP looking good!   BP Readings from Last 3 Encounters:  10/21/20 122/71  07/22/20 (!) 117/64  04/21/20 130/78          Visit summary with medication list and pertinent instructions was printed for patient to review. All questions at time of visit were answered - patient instructed to contact office with any additional concerns or updates. ER/RTC precautions were reviewed with the patient.       Follow-up plan: Return in about 3 months (around 01/21/2021) for [place] (get labs prior to visit, orders are in).

## 2020-10-21 NOTE — Patient Instructions (Signed)
A1C trending up :( See attached  Work on diet/exercise to avoid needing more medications!   Wt Readings from Last 3 Encounters:  10/21/20 252 lb 4.8 oz (114.4 kg)  07/22/20 (!) 246 lb (111.6 kg)  04/21/20 248 lb 0.6 oz (112.5 kg)     BP looking good!   BP Readings from Last 3 Encounters:  10/21/20 122/71  07/22/20 (!) 117/64  04/21/20 130/78

## 2020-10-23 ENCOUNTER — Other Ambulatory Visit: Payer: Self-pay | Admitting: Osteopathic Medicine

## 2020-10-23 ENCOUNTER — Other Ambulatory Visit: Payer: Self-pay | Admitting: Family Medicine

## 2020-10-23 DIAGNOSIS — E119 Type 2 diabetes mellitus without complications: Secondary | ICD-10-CM

## 2020-10-24 NOTE — Telephone Encounter (Signed)
Forwarding to PCP for review.  

## 2020-10-26 ENCOUNTER — Other Ambulatory Visit: Payer: Self-pay | Admitting: Osteopathic Medicine

## 2021-01-21 DIAGNOSIS — Z0000 Encounter for general adult medical examination without abnormal findings: Secondary | ICD-10-CM | POA: Diagnosis not present

## 2021-01-21 DIAGNOSIS — E119 Type 2 diabetes mellitus without complications: Secondary | ICD-10-CM | POA: Diagnosis not present

## 2021-01-21 DIAGNOSIS — Z125 Encounter for screening for malignant neoplasm of prostate: Secondary | ICD-10-CM | POA: Diagnosis not present

## 2021-01-24 LAB — COMPLETE METABOLIC PANEL WITH GFR
AG Ratio: 2.4 (calc) (ref 1.0–2.5)
ALT: 16 U/L (ref 9–46)
AST: 15 U/L (ref 10–35)
Albumin: 4.5 g/dL (ref 3.6–5.1)
Alkaline phosphatase (APISO): 44 U/L (ref 35–144)
BUN/Creatinine Ratio: 24 (calc) — ABNORMAL HIGH (ref 6–22)
BUN: 26 mg/dL — ABNORMAL HIGH (ref 7–25)
CO2: 27 mmol/L (ref 20–32)
Calcium: 9.7 mg/dL (ref 8.6–10.3)
Chloride: 103 mmol/L (ref 98–110)
Creat: 1.07 mg/dL (ref 0.70–1.25)
GFR, Est African American: 83 mL/min/{1.73_m2} (ref >=60)
GFR, Est Non African American: 71 mL/min/{1.73_m2} (ref >=60)
Globulin: 1.9 g/dL (calc) (ref 1.9–3.7)
Glucose, Bld: 148 mg/dL — ABNORMAL HIGH (ref 65–139)
Potassium: 5 mmol/L (ref 3.5–5.3)
Sodium: 139 mmol/L (ref 135–146)
Total Bilirubin: 1.6 mg/dL — ABNORMAL HIGH (ref 0.2–1.2)
Total Protein: 6.4 g/dL (ref 6.1–8.1)

## 2021-01-24 LAB — HEMOGLOBIN A1C
Hgb A1c MFr Bld: 7.8 % of total Hgb — ABNORMAL HIGH (ref <5.7)
Mean Plasma Glucose: 177 mg/dL
eAG (mmol/L): 9.8 mmol/L

## 2021-01-24 LAB — LIPID PANEL
Cholesterol: 135 mg/dL (ref <200)
HDL: 54 mg/dL (ref >=40)
LDL Cholesterol (Calc): 56 mg/dL (calc)
Non-HDL Cholesterol (Calc): 81 mg/dL (calc) (ref <130)
Total CHOL/HDL Ratio: 2.5 (calc) (ref <5.0)
Triglycerides: 170 mg/dL — ABNORMAL HIGH (ref <150)

## 2021-01-24 LAB — PSA, TOTAL WITH REFLEX TO PSA, FREE: PSA, Total: 2 ng/mL (ref <=4.0)

## 2021-01-24 LAB — CBC
HCT: 45.2 % (ref 38.5–50.0)
Hemoglobin: 14.9 g/dL (ref 13.2–17.1)
MCH: 28.8 pg (ref 27.0–33.0)
MCHC: 33 g/dL (ref 32.0–36.0)
MCV: 87.4 fL (ref 80.0–100.0)
MPV: 9.8 fL (ref 7.5–12.5)
Platelets: 128 10*3/uL — ABNORMAL LOW (ref 140–400)
RBC: 5.17 10*6/uL (ref 4.20–5.80)
RDW: 13.2 % (ref 11.0–15.0)
WBC: 5.8 10*3/uL (ref 3.8–10.8)

## 2021-01-27 ENCOUNTER — Other Ambulatory Visit: Payer: Self-pay

## 2021-01-27 ENCOUNTER — Ambulatory Visit (INDEPENDENT_AMBULATORY_CARE_PROVIDER_SITE_OTHER): Payer: Medicare Other | Admitting: Osteopathic Medicine

## 2021-01-27 ENCOUNTER — Encounter: Payer: Self-pay | Admitting: Osteopathic Medicine

## 2021-01-27 VITALS — BP 123/81 | HR 65 | Temp 97.7°F | Wt 246.1 lb

## 2021-01-27 DIAGNOSIS — Z23 Encounter for immunization: Secondary | ICD-10-CM | POA: Diagnosis not present

## 2021-01-27 DIAGNOSIS — E119 Type 2 diabetes mellitus without complications: Secondary | ICD-10-CM

## 2021-01-27 DIAGNOSIS — D696 Thrombocytopenia, unspecified: Secondary | ICD-10-CM

## 2021-01-27 NOTE — Patient Instructions (Signed)
Immunization History  Administered Date(s) Administered  . Fluad Quad(high Dose 65+) 08/27/2019  . Influenza, High Dose Seasonal PF 08/13/2020  . Influenza,inj,Quad PF,6+ Mos 08/23/2015, 09/16/2018  . Influenza-Unspecified 09/07/2016, 10/08/2017  . PFIZER(Purple Top)SARS-COV-2 Vaccination 03/01/2020, 03/22/2020, 10/14/2020  . Pneumococcal Polysaccharide-23 01/12/2016, 01/27/2021  . Tdap 03/17/2013  . Zoster 08/23/2015  . Zoster Recombinat (Shingrix) 01/27/2021   Shingrix #2 due in 2-6 months, plan for this at the pharmacy [facility] e-mail our administrator so you shouldn't get a bill for today's since I didn't order it - sorry about this! I've educated my student on the intricacies of Medicare! *eye roll*)

## 2021-01-27 NOTE — Progress Notes (Signed)
HPI: William Webster is a 70 y.o. male who  has a past medical history of Anterior pituitary adenoma syndrome ([place]), Anxiety, Bimalleolar fracture of right ankle, Colon polyp, Diabetes mellitus without complication ([place]), Diabetic neuropathy ([employer]), Family history of cancer of GI tract, Family history of colon cancer, GERD (gastroesophageal reflux disease), [place] disease, Hyperlipidemia, Low back pain, Lumbar scoliosis, Prolactinoma ([place]), Sciatica of right side, and Spondylosis of lumbar joint.  he presents to [facility] today, 01/27/21,  for chief complaint of:  Strained muscle . Context: pt works with large trucks and thinks he may have injured it while climbing into the back of one of the trucks . Location: L side groin in the inguinal fold without radiation  . Quality: dull/aching . Severity: "i've had headaches worse" . Duration: 4-5 weeks . Modifying factors: o aggravated by climbing into bed o Improved with tylenol . Assoc signs/symptoms:  o Denies: back pain, fevers, weight loss, urinary symptoms, masses/lumps, skin changes, history of hernia or abdominal surgery   PSA stable 2.1 --> 2.0   Diabetes Current meds: metformin 1000 mg daily, pioglitazone 15 mg daily  A1C improved from 8.1 to 7.8  Working on eating healthy and planning to increase activity as the weather warms.    Past medical, surgical, social and family history reviewed:  Patient Active Problem List   Diagnosis Date Noted  . Bilateral shoulder pain 09/16/2018  . Fracture of clavicle, left, closed 08/01/2018  . Screening for colon cancer 12/10/2015  . Fracture of ankle, bimalleolar, right, closed 11/22/2015  . Pituitary adenoma ([place]) 08/17/2015  . Hyperprolactinemia ([place]) 08/17/2015  . Obesity 07/15/2015  . Spondylosis of lumbar region without myelopathy or radiculopathy 07/15/2015  . Primary osteoarthritis of both knees 07/15/2015  . Right cervical radiculopathy  01/07/2015  . HLD (hyperlipidemia) 01/05/2014  . Diabetes mellitus type 2, controlled, without complications ([place]) [id number]    Past Surgical History:  Procedure Laterality Date  . COLONOSCOPY  2012   [facility]  . ESOPHAGOGASTRODUODENOSCOPY     With [facility]-Said he had a bad case of reflux-said it been a few years. Maybe about 10 years or 11 years   . HEMORRHOID SURGERY    . ORIF ANKLE FRACTURE Right 11/24/2015   Procedure: OPEN REDUCTION INTERNAL FIXATION (ORIF) ANKLE FRACTURE;  Surgeon: Dorna Leitz, MD;  Location: [place];  Service: Orthopedics;  Laterality: Right;  Open reduction internal fixation right ankle fracture  . POLYPECTOMY    . TONSILLECTOMY      Social History   Tobacco Use  . Smoking status: Current Some Day Smoker    Types: Cigars  . Smokeless tobacco: Never Used  . Tobacco comment: occassional cigar-every month   Substance Use Topics  . Alcohol use: Yes    Alcohol/week: 10.0 standard drinks    Types: 10 Standard drinks or equivalent per week    Comment: social 12-20 drinks a week    Family History  Problem Relation Age of Onset  . Heart failure Mother   . Diabetes Mother   . Hyperlipidemia Mother   . Stroke Mother   . Colon cancer Mother   . Stroke Father   . Hyperlipidemia Father   . Diabetes Maternal Grandfather   . Heart disease Maternal Grandfather   . Stroke Maternal Grandfather   . Diabetes Paternal Grandfather   . Heart disease Paternal Grandfather   . Stroke Paternal Grandfather   . Esophageal cancer Neg Hx   . Stomach cancer Neg  Hx   . Rectal cancer Neg Hx      Current medication list and allergy/intolerance information reviewed:    Current Outpatient Medications  Medication Sig Dispense Refill  . cabergoline (DOSTINEX) 0.5 MG tablet TAKE 1 TABLET TWICE WEEKLY 30 tablet 2  . glucose blood test strip Check blood sugar twice daily. Diagnosis DM ICD 10 - E11.9 200 each prn  . GLYXAMBI 25-5 MG TABS TAKE 1 TABLET BY MOUTH  EVERY DAY 90 tablet 3  . lisinopril (ZESTRIL) 2.5 MG tablet TAKE 1 TABLET BY MOUTH EVERY DAY 90 tablet 1  . metFORMIN (GLUCOPHAGE) 1000 MG tablet TAKE 1 TABLET (1,000 MG TOTAL) BY MOUTH 2 (TWO) TIMES DAILY WITH A MEAL. 180 tablet 3  . Multiple Vitamins-Minerals (CENTRUM MEN PO) Take by mouth daily.    . ONE TOUCH ULTRA TEST test strip CHECK BLOOD SUGAR TWICE DAILY. DIAGNOSIS DM ICD 10 - E11.9 100 each 4  . pioglitazone (ACTOS) 15 MG tablet TAKE 1 TABLET BY MOUTH EVERY DAY 90 tablet 3  . rosuvastatin (CRESTOR) 40 MG tablet TAKE 1 TABLET (40 MG TOTAL) BY MOUTH DAILY. NEED LABS 90 tablet 0  . clobetasol ointment (TEMOVATE) 6.71 % Apply 1 application topically 2 (two) times daily. To affected area(s) as needed (Patient not taking: Reported on 01/27/2021) 15 g 1  . clotrimazole-betamethasone (LOTRISONE) cream APPLY TO AFFECTED AREA TWICE A DAY (Patient not taking: Reported on 01/27/2021) 30 g 0  . magnesium oxide (MAG-OX) 400 MG tablet Take 2 tablets (800 mg total) by mouth at bedtime. (Patient not taking: Reported on 01/27/2021) 180 tablet 3   Current Facility-Administered Medications  Medication Dose Route Frequency Provider Last Rate Last Admin  . 0.9 %  sodium chloride infusion  500 mL Intravenous Once Cirigliano, Vito V, DO        No Known Allergies    Review of Systems:  Constitutional:  No  fever, No unintentional weight changes.   Cardiac: No  chest pain, No  pressure, No palpitations  Respiratory:  No  shortness of breath  Gastrointestinal: No  abdominal pain, No  Nausea, No  diarrhea, No  constipation   Musculoskeletal: + new myalgia in L groin   Genitourinary: No dysuria, No difficulty initiating urination   Hem/Onc:  No  abnormal lymph node  Endocrine: No polyuria/polydipsia/polyphagia    Exam:  BP 123/81 (BP Location: Left Arm, Patient Position: Sitting, Cuff Size: Large)   Pulse 65   Temp 97.7 F (36.5 C) (Oral)   Wt 246 lb 1.9 oz (111.6 kg)   BMI 33.38 kg/m    Constitutional: VS see above. General Appearance: alert, well-developed, well-nourished, NAD  Eyes: Normal lids and conjunctive, non-icteric sclera  Neck: No masses, trachea midline.   Respiratory: Normal respiratory effort. no wheeze, no rhonchi, no rales  Cardiovascular: S1/S2 normal, no murmur, no rub/gallop auscultated. RRR.   Gastrointestinal: Nontender, no masses.   Musculoskeletal: Gait normal. No pain with abduction, adduction, flexion, or extension of L hip. No point tenderness of L hip or groin.   Neurological: Normal balance/coordination. No tremor. Strength of lower extremities 5/5 and symmetric   Skin: warm, dry, intact.  Psychiatric: Normal judgment/insight. Normal mood and affect.    No results found for this or any previous visit (from the past 72 hour(s)).  No results found.   ASSESSMENT/PLAN: The primary encounter diagnosis was Diabetes mellitus without complication ([place]). Diagnoses of Need for shingles vaccine, Need for pneumococcal vaccine, and Thrombocytopenia ([place]) were also pertinent to  this visit.   Thrombocytopenia  Recheck CBC  Diabetes  A1C improved (8.1 --> 7.8)  Follow up in 3 months to check A1C  Health maintenance   pneumococcal vaccination given   shingrex vaccination given   Muscle strain   Pt given stretches and exercises to help strengthen and stretch the muscles  Symptomatic management with tylenol  If pain does not improve will refer to Dr T   Orders Placed This Encounter  Procedures  . Pneumococcal polysaccharide vaccine 23-valent greater than or equal to 2yo subcutaneous/IM  . Varicella-zoster vaccine IM (Shingrix)  . CBC with Differential/Platelet  . Hemoglobin A1c    No orders of the defined types were placed in this encounter.   Patient Instructions   Immunization History  Administered Date(s) Administered  . Fluad Quad(high Dose 65+) 08/27/2019  . Influenza, High Dose Seasonal PF 08/13/2020  .  Influenza,inj,Quad PF,6+ Mos 08/23/2015, 09/16/2018  . Influenza-Unspecified 09/07/2016, 10/08/2017  . PFIZER(Purple Top)SARS-COV-2 Vaccination 03/01/2020, 03/22/2020, 10/14/2020  . Pneumococcal Polysaccharide-23 01/12/2016, 01/27/2021  . Tdap 03/17/2013  . Zoster 08/23/2015  . Zoster Recombinat (Shingrix) 01/27/2021   Shingrix #2 due in 2-6 months, plan for this at the pharmacy [facility] e-mail our administrator so you shouldn't get a bill for today's since I didn't order it - sorry about this! I've educated my student on the intricacies of Medicare! *eye roll*)            Visit summary with medication list and pertinent instructions was printed for patient to review. All questions at time of visit were answered - patient instructed to contact office with any additional concerns or updates. ER/RTC precautions were reviewed with the patient.     Please note: voice recognition software was used to produce this document, and typos may escape review. Please contact Dr. Sheppard Coil for any needed clarifications.     Follow-up plan: Return in about 3 months (around 04/26/2021) for labs prior to visit - recheck blood coutns and A1C. See Dr T if groin no better! Marland Kitchen

## 2021-04-16 ENCOUNTER — Other Ambulatory Visit: Payer: Self-pay | Admitting: Osteopathic Medicine

## 2021-04-16 DIAGNOSIS — Z794 Long term (current) use of insulin: Secondary | ICD-10-CM

## 2021-04-16 DIAGNOSIS — E119 Type 2 diabetes mellitus without complications: Secondary | ICD-10-CM

## 2021-04-28 ENCOUNTER — Ambulatory Visit: Payer: Medicare Other | Admitting: Osteopathic Medicine

## 2021-05-06 ENCOUNTER — Ambulatory Visit: Payer: Medicare Other | Admitting: Osteopathic Medicine

## 2021-05-20 DIAGNOSIS — D696 Thrombocytopenia, unspecified: Secondary | ICD-10-CM | POA: Diagnosis not present

## 2021-05-20 DIAGNOSIS — E119 Type 2 diabetes mellitus without complications: Secondary | ICD-10-CM | POA: Diagnosis not present

## 2021-05-21 LAB — HEMOGLOBIN A1C
Hgb A1c MFr Bld: 7.8 % of total Hgb — ABNORMAL HIGH (ref <5.7)
Mean Plasma Glucose: 177 mg/dL
eAG (mmol/L): 9.8 mmol/L

## 2021-05-21 LAB — CBC WITH DIFFERENTIAL/PLATELET
Absolute Monocytes: 599 cells/uL (ref 200–950)
Basophils Absolute: 51 cells/uL (ref 0–200)
Basophils Relative: 0.7 %
Eosinophils Absolute: 139 cells/uL (ref 15–500)
Eosinophils Relative: 1.9 %
HCT: 45.8 % (ref 38.5–50.0)
Hemoglobin: 14.9 g/dL (ref 13.2–17.1)
Lymphs Abs: 1730 cells/uL (ref 850–3900)
MCH: 28.6 pg (ref 27.0–33.0)
MCHC: 32.5 g/dL (ref 32.0–36.0)
MCV: 87.9 fL (ref 80.0–100.0)
MPV: 10.1 fL (ref 7.5–12.5)
Monocytes Relative: 8.2 %
Neutro Abs: 4782 cells/uL (ref 1500–7800)
Neutrophils Relative %: 65.5 %
Platelets: 121 10*3/uL — ABNORMAL LOW (ref 140–400)
RBC: 5.21 10*6/uL (ref 4.20–5.80)
RDW: 13.1 % (ref 11.0–15.0)
Total Lymphocyte: 23.7 %
WBC: 7.3 10*3/uL (ref 3.8–10.8)

## 2021-06-02 ENCOUNTER — Encounter: Payer: Self-pay | Admitting: Osteopathic Medicine

## 2021-06-02 ENCOUNTER — Other Ambulatory Visit: Payer: Self-pay

## 2021-06-02 ENCOUNTER — Ambulatory Visit (INDEPENDENT_AMBULATORY_CARE_PROVIDER_SITE_OTHER): Payer: Medicare Other | Admitting: Osteopathic Medicine

## 2021-06-02 VITALS — BP 137/67 | HR 60 | Temp 98.5°F | Wt 247.1 lb

## 2021-06-02 DIAGNOSIS — L299 Pruritus, unspecified: Secondary | ICD-10-CM

## 2021-06-02 DIAGNOSIS — E1169 Type 2 diabetes mellitus with other specified complication: Secondary | ICD-10-CM

## 2021-06-02 NOTE — Progress Notes (Signed)
William Webster is a 70 y.o. male who presents to  [place] at [facility]  today, 06/02/21, seeking care for the following:  Diabetes follow up  A1C 05/20/21: 7.8 A1C last time 01/21/21: 7.8  Wt Readings from Last 3 Encounters:  06/02/21 247 lb 1.3 oz (112.1 kg)  01/27/21 246 lb 1.9 oz (111.6 kg)  10/21/20 252 lb 4.8 oz (114.4 kg)   Ears itching, would like me to take a look. Normal TM and canals on exam, no wax visible   ASSESSMENT & PLAN with other pertinent findings:  The primary encounter diagnosis was Controlled type 2 diabetes mellitus with other specified complication, without long-term current use of insulin ([place]). A diagnosis of Ear itching was also pertinent to this visit.   1. Controlled type 2 diabetes mellitus with other specified complication, without long-term current use of insulin ([place]) Pt reports room for improvement w/ diet and exerise, will defer Rx adjustment for today   2. Ear itching Advised avoid aggressive cleaning, the ear does need some wax, ok to use mineral oil drops few tiems per week     See below for relevant physical exam findings  See below for recent lab and imaging results reviewed  Medications, allergies, PMH, PSH, SocH, FamH reviewed below    Follow-up instructions: Return in about 3 months (around 09/02/2021) for MONITOR A1C, SEE [place] SOONER IF NEEDED. CALL/MESSAGE W/ QUESTIONS.                                        Exam:  BP 137/67 (BP Location: Left Arm, Patient Position: Sitting, Cuff Size: Normal)   Pulse 60   Temp 98.5 F (36.9 C) (Oral)   Wt 247 lb 1.3 oz (112.1 kg)   BMI 33.51 kg/m  Constitutional: VS see above. General Appearance: alert, well-developed, well-nourished, NAD Neck: No masses, trachea midline.  Respiratory: Normal respiratory effort. no wheeze, no rhonchi, no rales Cardiovascular: S1/S2 normal, no murmur, no rub/gallop  auscultated. RRR.  Musculoskeletal: Gait normal. Symmetric and independent movement of all extremities Neurological: Normal balance/coordination. No tremor. Skin: warm, dry, intact.  Psychiatric: Normal judgment/insight. Normal mood and affect. Oriented x3.   Current Meds  Medication Sig   cabergoline (DOSTINEX) 0.5 MG tablet TAKE 1 TABLET TWICE WEEKLY   glucose blood test strip Check blood sugar twice daily. Diagnosis DM ICD 10 - E11.9   GLYXAMBI 25-5 MG TABS TAKE 1 TABLET BY MOUTH EVERY DAY   lisinopril (ZESTRIL) 2.5 MG tablet TAKE 1 TABLET BY MOUTH EVERY DAY   metFORMIN (GLUCOPHAGE) 1000 MG tablet TAKE 1 TABLET (1,000 MG TOTAL) BY MOUTH 2 (TWO) TIMES DAILY WITH A MEAL.   Multiple Vitamins-Minerals (CENTRUM MEN PO) Take by mouth daily.   ONE TOUCH ULTRA TEST test strip CHECK BLOOD SUGAR TWICE DAILY. DIAGNOSIS DM ICD 10 - E11.9   pioglitazone (ACTOS) 15 MG tablet TAKE 1 TABLET BY MOUTH EVERY DAY   rosuvastatin (CRESTOR) 40 MG tablet TAKE 1 TABLET (40 MG TOTAL) BY MOUTH DAILY. NEED LABS   Current Facility-Administered Medications for the 06/02/21 encounter (Office Visit) with Emeterio Reeve, DO  Medication   0.9 %  sodium chloride infusion    No Known Allergies  Patient Active Problem List   Diagnosis Date Noted   Bilateral shoulder pain 09/16/2018   Fracture of clavicle, left, closed 08/01/2018   Screening for colon  cancer 12/10/2015   Fracture of ankle, bimalleolar, right, closed 11/22/2015   Pituitary adenoma ([place]) 08/17/2015   Hyperprolactinemia ([place]) 08/17/2015   Obesity 07/15/2015   Spondylosis of lumbar region without myelopathy or radiculopathy 07/15/2015   Primary osteoarthritis of both knees 07/15/2015   Right cervical radiculopathy 01/07/2015   HLD (hyperlipidemia) 01/05/2014   Diabetes mellitus type 2, controlled, without complications ([place]) 40/98/1191    Family History  Problem Relation Age of Onset   Heart failure Mother    Diabetes Mother     Hyperlipidemia Mother    Stroke Mother    Colon cancer Mother    Stroke Father    Hyperlipidemia Father    Diabetes Maternal Grandfather    Heart disease Maternal Grandfather    Stroke Maternal Grandfather    Diabetes Paternal Grandfather    Heart disease Paternal Grandfather    Stroke Paternal Grandfather    Esophageal cancer Neg Hx    Stomach cancer Neg Hx    Rectal cancer Neg Hx     Social History   Tobacco Use  Smoking Status Some Days   Pack years: 0.00   Types: Cigars  Smokeless Tobacco Never  Tobacco Comments   occassional cigar-every month     Past Surgical History:  Procedure Laterality Date   COLONOSCOPY  2012   [facility]   ESOPHAGOGASTRODUODENOSCOPY     With [facility] he had a bad case of reflux-said it been a few years. Maybe about 10 years or 11 years    HEMORRHOID SURGERY     ORIF ANKLE FRACTURE Right 11/24/2015   Procedure: OPEN REDUCTION INTERNAL FIXATION (ORIF) ANKLE FRACTURE;  Surgeon: Dorna Leitz, MD;  Location: [place];  Service: Orthopedics;  Laterality: Right;  Open reduction internal fixation right ankle fracture   POLYPECTOMY     TONSILLECTOMY      Immunization History  Administered Date(s) Administered   Fluad Quad(high Dose 65+) 08/27/2019   Influenza, High Dose Seasonal PF 08/13/2020   Influenza,inj,Quad PF,6+ Mos 08/23/2015, 09/16/2018   Influenza-Unspecified 09/07/2016, 10/08/2017   PFIZER(Purple Top)SARS-COV-2 Vaccination 03/01/2020, 03/22/2020, 10/14/2020   Pneumococcal Polysaccharide-23 01/12/2016, 01/27/2021   Tdap 03/17/2013   Zoster Recombinat (Shingrix) 01/27/2021   Zoster, Live 08/23/2015    Recent Results (from the past 2160 hour(s))  CBC with Differential/Platelet     Status: Abnormal   Collection Time: 05/20/21  7:03 AM  Result Value Ref Range   WBC 7.3 3.8 - 10.8 Thousand/uL   RBC 5.21 4.20 - 5.80 Million/uL   Hemoglobin 14.9 13.2 - 17.1 g/dL   HCT 45.8 38.5 - 50.0 %   MCV 87.9 80.0 - 100.0 fL    MCH 28.6 27.0 - 33.0 pg   MCHC 32.5 32.0 - 36.0 g/dL   RDW 13.1 11.0 - 15.0 %   Platelets 121 (L) 140 - 400 Thousand/uL   MPV 10.1 7.5 - 12.5 fL   Neutro Abs 4,782 1,500 - 7,800 cells/uL   Lymphs Abs 1,730 850 - 3,900 cells/uL   Absolute Monocytes 599 200 - 950 cells/uL   Eosinophils Absolute 139 15 - 500 cells/uL   Basophils Absolute 51 0 - 200 cells/uL   Neutrophils Relative % 65.5 %   Total Lymphocyte 23.7 %   Monocytes Relative 8.2 %   Eosinophils Relative 1.9 %   Basophils Relative 0.7 %  Hemoglobin A1c     Status: Abnormal   Collection Time: 05/20/21  7:03 AM  Result Value Ref Range   Hgb A1c MFr Bld  7.8 (H) <5.7 % of total Hgb    Comment: For someone without known diabetes, a hemoglobin A1c value of 6.5% or greater indicates that they may have  diabetes and this should be confirmed with a follow-up  test. . For someone with known diabetes, a value <7% indicates  that their diabetes is well controlled and a value  greater than or equal to 7% indicates suboptimal  control. A1c targets should be individualized based on  duration of diabetes, age, comorbid conditions, and  other considerations. . Currently, no consensus exists regarding use of hemoglobin A1c for diagnosis of diabetes for children. .    Mean Plasma Glucose 177 mg/dL   eAG (mmol/L) 9.8 mmol/L    No results found.     All questions at time of visit were answered - patient instructed to contact office with any additional concerns or updates. ER/RTC precautions were reviewed with the patient as applicable.   Please note: manual typing as well as voice recognition software may have been used to produce this document - typos may escape review. Please contact Dr. Sheppard Coil for any needed clarifications.

## 2021-07-17 ENCOUNTER — Other Ambulatory Visit: Payer: Self-pay | Admitting: Osteopathic Medicine

## 2021-07-17 DIAGNOSIS — E119 Type 2 diabetes mellitus without complications: Secondary | ICD-10-CM

## 2021-08-11 ENCOUNTER — Other Ambulatory Visit: Payer: Self-pay | Admitting: Osteopathic Medicine

## 2021-08-11 DIAGNOSIS — E221 Hyperprolactinemia: Secondary | ICD-10-CM

## 2021-09-02 ENCOUNTER — Other Ambulatory Visit: Payer: Self-pay

## 2021-09-02 ENCOUNTER — Encounter: Payer: Self-pay | Admitting: Osteopathic Medicine

## 2021-09-02 ENCOUNTER — Ambulatory Visit (INDEPENDENT_AMBULATORY_CARE_PROVIDER_SITE_OTHER): Payer: Medicare Other | Admitting: Osteopathic Medicine

## 2021-09-02 VITALS — BP 111/63 | HR 60 | Ht 72.0 in | Wt 247.0 lb

## 2021-09-02 DIAGNOSIS — E1169 Type 2 diabetes mellitus with other specified complication: Secondary | ICD-10-CM

## 2021-09-02 DIAGNOSIS — Z23 Encounter for immunization: Secondary | ICD-10-CM | POA: Diagnosis not present

## 2021-09-02 LAB — POCT GLYCOSYLATED HEMOGLOBIN (HGB A1C): HbA1c POC (<> result, manual entry): 8 % (ref 4.0–5.6)

## 2021-09-02 MED ORDER — PIOGLITAZONE HCL 30 MG PO TABS: 30.0000 mg | ORAL_TABLET | Freq: Every day | ORAL | 3 refills | Status: DC

## 2021-09-02 NOTE — Progress Notes (Signed)
William Webster is a 70 y.o. male who presents to  [place] at [facility]  today, 09/02/21, seeking care for the following:  Diabetes follow up  A1C today 09/02/21: 8.0 :( Increasing pioglitazone today from 15 mg to 30 mg  A1C 05/20/21: 7.8 and weight about 247 lb - we decided no med change  A1C 01/21/21: 7.8 Current meds:  Metformin 1000 mg bid Glyxambi (empagliflozin + linagliptin) 25-5 mg daily Pioglitazone 15 mg daily On Lisinopril, on Crestor  Other preventive:  Due for eye exam  UTD PPSV23 Needs flu vaccine for this season - done today!     ASSESSMENT & PLAN with other pertinent findings:  The primary encounter diagnosis was Controlled type 2 diabetes mellitus with other specified complication, without long-term current use of insulin ([place]). A diagnosis of Need for influenza vaccination was also pertinent to this visit.   1. Controlled type 2 diabetes mellitus with other specified complication, without long-term current use of insulin ([place]) Pt reports room for improvement w/ diet and exerise, will go ahead and increase pioglitazone today.     See below for relevant physical exam findings  See below for recent lab and imaging results reviewed  Medications, allergies, PMH, PSH, SocH, FamH reviewed below    Follow-up instructions: Return in about 3 months (around 12/02/2021) for MONITOR A1C W/ DR MATTHEWS .  Patient previously seeing Dr. Darene Lamer for primary care, inquires if he can get back in with Dr. Darene Lamer, I sent phone message but patient has follow-up in place with Dr. Zigmund Daniel just in case.                                        Exam:  BP 111/63   Pulse 60   Ht 6' (1.829 m)   Wt 247 lb (112 kg)   BMI 33.50 kg/m  Constitutional: VS see above. General Appearance: alert, well-developed, well-nourished, NAD Neck: No masses, trachea midline.  Respiratory: Normal respiratory effort. no  wheeze, no rhonchi, no rales Cardiovascular: S1/S2 normal, no murmur, no rub/gallop auscultated. RRR.  Musculoskeletal: Gait normal. Symmetric and independent movement of all extremities Neurological: Normal balance/coordination. No tremor. Skin: warm, dry, intact.  Psychiatric: Normal judgment/insight. Normal mood and affect. Oriented x3.   Current Meds  Medication Sig   pioglitazone (ACTOS) 30 MG tablet Take 1 tablet (30 mg total) by mouth daily.   Current Facility-Administered Medications for the 09/02/21 encounter (Office Visit) with Emeterio Reeve, DO  Medication   0.9 %  sodium chloride infusion    No Known Allergies  Patient Active Problem List   Diagnosis Date Noted   Bilateral shoulder pain 09/16/2018   Fracture of clavicle, left, closed 08/01/2018   Screening for colon cancer 12/10/2015   Fracture of ankle, bimalleolar, right, closed 11/22/2015   Pituitary adenoma ([place]) 08/17/2015   Hyperprolactinemia ([place]) 08/17/2015   Obesity 07/15/2015   Spondylosis of lumbar region without myelopathy or radiculopathy 07/15/2015   Primary osteoarthritis of both knees 07/15/2015   Right cervical radiculopathy 01/07/2015   HLD (hyperlipidemia) 01/05/2014   Diabetes mellitus type 2, controlled, without complications ([place]) 123456    Family History  Problem Relation Age of Onset   Heart failure Mother    Diabetes Mother    Hyperlipidemia Mother    Stroke Mother    Colon cancer Mother    Stroke Father  Hyperlipidemia Father    Diabetes Maternal Grandfather    Heart disease Maternal Grandfather    Stroke Maternal Grandfather    Diabetes Paternal Grandfather    Heart disease Paternal Grandfather    Stroke Paternal Grandfather    Esophageal cancer Neg Hx    Stomach cancer Neg Hx    Rectal cancer Neg Hx     Social History   Tobacco Use  Smoking Status Some Days   Types: Cigars  Smokeless Tobacco Never  Tobacco Comments   occassional cigar-every month     Past  Surgical History:  Procedure Laterality Date   COLONOSCOPY  2012   [facility]   ESOPHAGOGASTRODUODENOSCOPY     With [facility] he had a bad case of reflux-said it been a few years. Maybe about 10 years or 11 years    HEMORRHOID SURGERY     ORIF ANKLE FRACTURE Right 11/24/2015   Procedure: OPEN REDUCTION INTERNAL FIXATION (ORIF) ANKLE FRACTURE;  Surgeon: Dorna Leitz, MD;  Location: [place];  Service: Orthopedics;  Laterality: Right;  Open reduction internal fixation right ankle fracture   POLYPECTOMY     TONSILLECTOMY      Immunization History  Administered Date(s) Administered   Fluad Quad(high Dose 65+) 08/27/2019   Influenza, High Dose Seasonal PF 08/13/2020   Influenza,inj,Quad PF,6+ Mos 08/23/2015, 09/16/2018, 09/02/2021   Influenza-Unspecified 09/07/2016, 10/08/2017   PFIZER(Purple Top)SARS-COV-2 Vaccination 03/01/2020, 03/22/2020, 10/14/2020   Pneumococcal Polysaccharide-23 01/12/2016, 01/27/2021   Tdap 03/17/2013   Zoster Recombinat (Shingrix) 01/27/2021   Zoster, Live 08/23/2015    Recent Results (from the past 2160 hour(s))  POCT HgB A1C     Status: None   Collection Time: 09/02/21  8:32 AM  Result Value Ref Range   Hemoglobin A1C     HbA1c POC (<> result, manual entry) 8.0 4.0 - 5.6 %   HbA1c, POC (prediabetic range)     HbA1c, POC (controlled diabetic range)       No results found.     All questions at time of visit were answered - patient instructed to contact office with any additional concerns or updates. ER/RTC precautions were reviewed with the patient as applicable.   Please note: manual typing as well as voice recognition software may have been used to produce this document - typos may escape review. Please contact Dr. Sheppard Coil for any needed clarifications.

## 2021-09-02 NOTE — Patient Instructions (Signed)
Increase pioglitazone from 15 mg to 30 mg  New Rx sent, can double up on what you have  Goal A1C <7.0 I believe in you!

## 2021-09-05 ENCOUNTER — Telehealth: Payer: Self-pay | Admitting: Osteopathic Medicine

## 2021-09-05 NOTE — Telephone Encounter (Signed)
Spoke with patient to schedule Medicare Annual Wellness Visit (AWV) either virtually or in office.  Patient would like a call back med to last oct  A lot going on right now   Last AWV  WTM 06/20/18 ; please schedule at anytime with health coach

## 2021-09-05 NOTE — Telephone Encounter (Signed)
Patient was previously being seen for primary care by Dr. Darene Lamer but transition to me when I started practicing here.  Patient would like to know if he could reestablish with Dr. Darene Lamer

## 2021-09-06 NOTE — Telephone Encounter (Signed)
Please call patient to reschedule to Dr T for follow-up in 3-4 mos

## 2021-09-06 NOTE — Telephone Encounter (Signed)
Okay I will take him.

## 2021-09-06 NOTE — Telephone Encounter (Signed)
Appointment has been made

## 2021-09-07 ENCOUNTER — Other Ambulatory Visit: Payer: Self-pay

## 2021-09-07 DIAGNOSIS — E119 Type 2 diabetes mellitus without complications: Secondary | ICD-10-CM

## 2021-09-07 MED ORDER — ROSUVASTATIN CALCIUM 40 MG PO TABS: 40.0000 mg | ORAL_TABLET | Freq: Every day | ORAL | 3 refills | Status: DC

## 2021-09-07 MED ORDER — LISINOPRIL 2.5 MG PO TABS: 2.5000 mg | ORAL_TABLET | Freq: Every day | ORAL | 3 refills | Status: DC

## 2021-09-07 MED ORDER — GLYXAMBI 25-5 MG PO TABS: 1.0000 | ORAL_TABLET | Freq: Every day | ORAL | 3 refills | Status: DC

## 2021-10-06 ENCOUNTER — Other Ambulatory Visit: Payer: Self-pay

## 2021-10-06 DIAGNOSIS — Z23 Encounter for immunization: Secondary | ICD-10-CM

## 2021-10-06 MED ORDER — ZOSTER VAC RECOMB ADJUVANTED 50 MCG/0.5ML IM SUSR: 0.5000 mL | Freq: Once | INTRAMUSCULAR | 0 refills | Status: AC

## 2021-10-15 ENCOUNTER — Other Ambulatory Visit: Payer: Self-pay | Admitting: Osteopathic Medicine

## 2021-10-15 DIAGNOSIS — Z794 Long term (current) use of insulin: Secondary | ICD-10-CM

## 2021-10-15 DIAGNOSIS — E119 Type 2 diabetes mellitus without complications: Secondary | ICD-10-CM

## 2021-10-17 ENCOUNTER — Other Ambulatory Visit: Payer: Self-pay

## 2021-10-17 ENCOUNTER — Ambulatory Visit (INDEPENDENT_AMBULATORY_CARE_PROVIDER_SITE_OTHER): Payer: Medicare Other | Admitting: Sports Medicine

## 2021-10-17 DIAGNOSIS — L989 Disorder of the skin and subcutaneous tissue, unspecified: Secondary | ICD-10-CM

## 2021-10-17 DIAGNOSIS — M79671 Pain in right foot: Secondary | ICD-10-CM | POA: Diagnosis not present

## 2021-10-17 DIAGNOSIS — S76301A Unspecified injury of muscle, fascia and tendon of the posterior muscle group at thigh level, right thigh, initial encounter: Secondary | ICD-10-CM

## 2021-10-17 DIAGNOSIS — C439 Malignant melanoma of skin, unspecified: Secondary | ICD-10-CM | POA: Insufficient documentation

## 2021-10-17 NOTE — Assessment & Plan Note (Signed)
Sally does have a fairly large hyperpigmented variegated and irregular skin lesion on the right side of his neck, he tells me its been there for a long time, decades, I really do think we need to biopsy this to ensure its not a melanoma.  We will do this at his follow-up visit.

## 2021-10-17 NOTE — Assessment & Plan Note (Signed)
Intermittent burning sensation fifth metatarsal, moderate pes cavus. Minimal tenderness to palpation, adding a lateral wedge, if insufficient improvement by the follow-up visit we will further investigate his lumbar spine and likely get him in with podiatry for consideration of custom orthotics.

## 2021-10-17 NOTE — Assessment & Plan Note (Signed)
Right-sided hamstring injury, mid muscle belly tenderness, significant bruising as is typical, strapped with a compressive dressing, declines formal physical therapy, adding home conditioning, return to see me in 4 weeks, formal PT if not sufficiently better.

## 2021-10-17 NOTE — Progress Notes (Addendum)
    Procedures performed today:    Right knee/thigh was strapped with a compressive dressing.  Independent interpretation of notes and tests performed by another provider:   None.  Brief History, Exam, Impression, and Recommendations:    Hamstring injury, right, initial encounter Right-sided hamstring injury, mid muscle belly tenderness, significant bruising as is typical, strapped with a compressive dressing, declines formal physical therapy, adding home conditioning, return to see me in 4 weeks, formal PT if not sufficiently better.  Right foot pain Intermittent burning sensation fifth metatarsal, moderate pes cavus. Minimal tenderness to palpation, adding a lateral wedge, if insufficient improvement by the follow-up visit we will further investigate his lumbar spine and likely get him in with podiatry for consideration of custom orthotics.  Skin lesion of neck William Webster does have a fairly large hyperpigmented variegated and irregular skin lesion on the right side of his neck, he tells me its been there for a long time, decades, I really do think we need to biopsy this to ensure its not a melanoma.  We will do this at his follow-up visit.    ___________________________________________ Gwen Her. Dianah Field, M.D., ABFM., CAQSM. Primary Care and [place] Instructor of [place] of [facility] of Medicine

## 2021-10-17 NOTE — Addendum Note (Signed)
Addended by: Silverio Decamp on: 10/17/2021 04:01 PM   Modules accepted: Level of Service

## 2021-10-26 ENCOUNTER — Other Ambulatory Visit: Payer: Self-pay | Admitting: Osteopathic Medicine

## 2021-10-26 DIAGNOSIS — E119 Type 2 diabetes mellitus without complications: Secondary | ICD-10-CM

## 2021-12-01 ENCOUNTER — Ambulatory Visit: Payer: Medicare Other | Admitting: Sports Medicine

## 2021-12-01 ENCOUNTER — Ambulatory Visit: Payer: Medicare Other | Admitting: Family Medicine

## 2022-01-25 DIAGNOSIS — C801 Malignant (primary) neoplasm, unspecified: Secondary | ICD-10-CM

## 2022-01-25 HISTORY — DX: Malignant (primary) neoplasm, unspecified: C80.1

## 2022-01-27 ENCOUNTER — Other Ambulatory Visit: Payer: Self-pay

## 2022-01-27 ENCOUNTER — Other Ambulatory Visit: Payer: Self-pay | Admitting: Sports Medicine

## 2022-01-27 ENCOUNTER — Ambulatory Visit (INDEPENDENT_AMBULATORY_CARE_PROVIDER_SITE_OTHER): Payer: Medicare Other | Admitting: Sports Medicine

## 2022-01-27 VITALS — BP 118/80 | HR 64 | Wt 242.0 lb

## 2022-01-27 DIAGNOSIS — S76301A Unspecified injury of muscle, fascia and tendon of the posterior muscle group at thigh level, right thigh, initial encounter: Secondary | ICD-10-CM | POA: Diagnosis not present

## 2022-01-27 DIAGNOSIS — E119 Type 2 diabetes mellitus without complications: Secondary | ICD-10-CM

## 2022-01-27 DIAGNOSIS — Z1211 Encounter for screening for malignant neoplasm of colon: Secondary | ICD-10-CM | POA: Diagnosis not present

## 2022-01-27 DIAGNOSIS — C434 Malignant melanoma of scalp and neck: Secondary | ICD-10-CM | POA: Diagnosis not present

## 2022-01-27 DIAGNOSIS — L989 Disorder of the skin and subcutaneous tissue, unspecified: Secondary | ICD-10-CM | POA: Diagnosis not present

## 2022-01-27 DIAGNOSIS — M79671 Pain in right foot: Secondary | ICD-10-CM | POA: Diagnosis not present

## 2022-01-27 LAB — POCT GLYCOSYLATED HEMOGLOBIN (HGB A1C): Hemoglobin A1C: 7.7 % — AB (ref 4.0–5.6)

## 2022-01-27 MED ORDER — GLIPIZIDE 10 MG PO TABS: 10.0000 mg | ORAL_TABLET | Freq: Two times a day (BID) | ORAL | 3 refills | Status: DC

## 2022-01-27 NOTE — Assessment & Plan Note (Signed)
Diabetes is controlled, however Glyxambi is getting too expensive, discontinue Glyxambi, we will do max dose metformin 1000 twice daily as well as 10 mg of glipizide twice daily, continue Actos, recheck A1c after 3 months of the new medication.

## 2022-01-27 NOTE — Assessment & Plan Note (Signed)
Needs new referral for 3-year colonoscopy recall.

## 2022-01-27 NOTE — Assessment & Plan Note (Signed)
Resolved, return as needed 

## 2022-01-27 NOTE — Assessment & Plan Note (Signed)
Resolved with lateral wedge, return as needed.

## 2022-01-27 NOTE — Patient Instructions (Signed)
Incision Care, Adult ?An incision is a surgical cut that is made through your skin. Most incisions are closed after a surgical procedure. Your incision may be closed with stitches (sutures), staples, skin glue, or adhesive strips. You may need to return to your health care provider to have sutures or staples removed. This may occur several days or several weeks after your surgery. Until then, the incision needs to be cared for properly to prevent infection. Follow instructions from your health care provider about how to care for your incision. ?Supplies needed: ?Soap, water, and a clean hand towel. ?Wound cleanser. ?A clean bandage (dressing), if needed. ?Cream or ointment, if told by your health care provider. ?Clean gauze. ?How to care for your incision ?Cleaning the incision ?Ask your health care provider how to clean the incision. This may include: ?Wearing medical gloves. ?Using mild soap and water, or wound cleanser. ?Using a clean gauze to pat the incision dry after cleaning it. ?Dressing changes ?Wash your hands with soap and water for at least 20 seconds before and after you change the dressing. If soap and water are not available, use hand sanitizer. ?Do not use disinfectants or antiseptics, such as rubbing alcohol, to clean open wounds unless told by your health care provider. ?Change your dressing as told by your health care provider. ?Leave sutures, staples, skin glue, or adhesive strips in place. These skin closures may need to stay in place for 2 weeks or longer. If adhesive strip edges start to loosen and curl up, you may trim the loose edges. Do not remove adhesive strips completely unless your health care provider tells you to do that. ?Apply cream or ointment. Do this only as told by your health care provider. ?Cover the incision with a clean dressing. Ask your health care provider when you can start to leave the incision uncovered. ?Checking for infection ?Check your incision area every day for  signs of infection. Check for: ?More redness, swelling, or pain. ?More fluid or blood. ?New warmth, hardness, or a rash that develops along the incision. ?Pus or a bad smell. ? ?Follow these instructions at home ?Medicines ?Take over-the-counter and prescription medicines only as told by your health care provider. ?If you were prescribed an antibiotic medicine, cream, or ointment, take or apply it as told by your health care provider. Do not stop using the antibiotic even if your condition improves. ?Eating and drinking ?Eat a diet that includes protein, vitamin A, vitamin C, and other nutrient-rich foods to help the wound heal. ?Foods rich in protein include meat, fish, eggs, dairy, beans, nuts, and protein supplement drinks. ?Foods rich in vitamin A include carrots and dark green, leafy vegetables. ?Foods rich in vitamin C include citrus fruits, tomatoes, broccoli, and peppers. ?Drink enough fluid to keep your urine pale yellow. ?General instructions ? ?Do not take baths, swim, use a hot tub, or do anything that would put the incision underwater until your health care provider approves. Ask your health care provider if you may take showers. You may only be allowed to take sponge baths. ?Limit movement around your incision to promote healing. ?Avoid straining, lifting, or exercising for the first 2 weeks after your procedure, or for as long as told by your health care provider. ?Return to your normal activities as told by your health care provider. Ask your health care provider what activities are safe for you. ?Do not scratch, pick, or scrub the incision. Keep it covered as told by your health care provider. ?  Protect your incision from the sun when you are outside for the first 6 months, or for as long as told by your health care provider. Cover up the scar area or apply sunscreen that has an SPF of at least 30. ?Do not use any products that contain nicotine or tobacco, such as cigarettes, e-cigarettes, and  chewing tobacco. These can delay incision healing. If you need help quitting, ask your health care provider. ?Keep all follow-up visits. This is important. ?Contact a health care provider if: ?You have any of these signs of infection: ?More redness, swelling, or pain around your incision. ?More fluid or blood coming from your incision. ?New warmth or hardness around your incision. ?Pus or a bad smell coming from your incision. ?A rash that develops along the incision. ?You feel nauseous or you vomit. ?You are dizzy. ?Your sutures, staples, skin glue, or adhesive strips come undone. ?Your wound gets bigger. ?You have a fever. ?Get help right away if: ?Your incision bleeds through the dressing and the bleeding does not stop with gentle pressure. ?The edges of your incision open up and separate. ?These symptoms may represent a serious problem that is an emergency. Do not wait to see if the symptoms will go away. Get medical help right away. Call your local emergency services (911 in the [place] Do not drive yourself to the hospital. ?Summary ?Follow instructions from your health care provider about how to care for your incision. ?Wash your hands with soap and water for at least 20 seconds before and after you change the dressing. If soap and water are not available, use hand sanitizer. ?Check your incision area every day for signs of infection. ?Keep all follow-up visits. This is important. ?This information is not intended to replace advice given to you by your health care provider. Make sure you discuss any questions you have with your health care provider. ?Document Revised: 03/14/2021 Document Reviewed: 03/14/2021 ?Elsevier Patient Education ? 2022 Elsevier Inc. ? ?

## 2022-01-27 NOTE — Progress Notes (Addendum)
° ° °  Procedures performed today:    Procedure: 6 mm punch biopsy of suspicious skin lesion right side of the neck Risks, benefits, and alternatives explained and consent obtained. Time out conducted. Surface prepped with alcohol. 2cc lidocaine with epinephine infiltrated in a field block. Adequate anesthesia ensured. Area prepped and draped in a sterile fashion. Excision performed with: I made a full-thickness punch and then used a single 3-0 Ethilon suture simple interrupted to close the skin defect. Hemostasis achieved. Pt stable.  Independent interpretation of notes and tests performed by another provider:   None.  Brief History, Exam, Impression, and Recommendations:    Diabetes mellitus type 2, controlled, without complications ([place]) Diabetes is controlled, however Glyxambi is getting too expensive, discontinue Glyxambi, we will do max dose metformin 1000 twice daily as well as 10 mg of glipizide twice daily, continue Actos, recheck A1c after 3 months of the new medication.  Hamstring injury, right, initial encounter Resolved, return as needed.  Right foot pain Resolved with lateral wedge, return as needed.  Screening for colon cancer Needs new referral for 3-year colonoscopy recall.  Malignant melanoma ([place]) Skin lesion dermatopathology did come back as a malignant melanoma, referral to surgical oncology, this will need a wide excision, likely lymph node sampling and potentially radiation.    ___________________________________________ Gwen Her. Dianah Field, M.D., ABFM., CAQSM. Primary Care and [place] Instructor of [place] of [facility] of Medicine

## 2022-01-27 NOTE — Addendum Note (Signed)
Addended by: Gust Brooms on: 01/27/2022 10:48 AM   Modules accepted: Orders

## 2022-01-27 NOTE — Addendum Note (Signed)
Addended by: Gust Brooms on: 01/27/2022 10:05 AM   Modules accepted: Orders

## 2022-01-31 ENCOUNTER — Telehealth: Payer: Self-pay

## 2022-01-31 NOTE — Telephone Encounter (Signed)
Dr. Darene Lamer. I got a call from the pathology to pass on a report of the neck biopsy. It is melanoma. Clark level 2. Tumor thickness 0.4mm. Peripheral margin is involved and Deep Margin is free. They will fax over a report this this afternoon or by tomorrow morning.

## 2022-02-02 NOTE — Assessment & Plan Note (Signed)
Skin lesion dermatopathology did come back as a malignant melanoma, referral to surgical oncology, this will need a wide excision, likely lymph node sampling and potentially radiation.

## 2022-02-02 NOTE — Telephone Encounter (Signed)
Thank you, melanoma noted, referral placed to surgical oncology.

## 2022-02-02 NOTE — Addendum Note (Signed)
Addended by: Silverio Decamp on: 02/02/2022 08:01 AM   Modules accepted: Orders

## 2022-02-03 ENCOUNTER — Ambulatory Visit (INDEPENDENT_AMBULATORY_CARE_PROVIDER_SITE_OTHER): Payer: Medicare Other | Admitting: Sports Medicine

## 2022-02-03 ENCOUNTER — Other Ambulatory Visit: Payer: Self-pay

## 2022-02-03 DIAGNOSIS — C434 Malignant melanoma of scalp and neck: Secondary | ICD-10-CM | POA: Diagnosis not present

## 2022-02-03 NOTE — Assessment & Plan Note (Signed)
William Webster returns for suture removal, I did a 6 mm punch biopsy that came back malignant melanoma. I have referred him to surgical oncology.

## 2022-02-03 NOTE — Progress Notes (Signed)
° ° °  Procedures performed today:    None.  Independent interpretation of notes and tests performed by another provider:   None.  Brief History, Exam, Impression, and Recommendations:    Malignant melanoma ([place]) William Webster returns for suture removal, I did a 6 mm punch biopsy that came back malignant melanoma. I have referred him to surgical oncology.    ___________________________________________ Gwen Her. Dianah Field, M.D., ABFM., CAQSM. Primary Care and [place] Instructor of [place] of [facility] of Medicine

## 2022-02-10 ENCOUNTER — Encounter (HOSPITAL_BASED_OUTPATIENT_CLINIC_OR_DEPARTMENT_OTHER)
Admission: RE | Admit: 2022-02-10 | Discharge: 2022-02-10 | Disposition: A | Discharge status: Home / Self Care | Payer: Medicare Other | Source: Ambulatory Visit | Attending: General Surgery | Admitting: General Surgery

## 2022-02-10 ENCOUNTER — Other Ambulatory Visit: Payer: Self-pay | Admitting: General Surgery

## 2022-02-10 ENCOUNTER — Encounter (HOSPITAL_BASED_OUTPATIENT_CLINIC_OR_DEPARTMENT_OTHER): Payer: Self-pay | Admitting: General Surgery

## 2022-02-10 ENCOUNTER — Other Ambulatory Visit: Payer: Self-pay

## 2022-02-10 DIAGNOSIS — Z01818 Encounter for other preprocedural examination: Secondary | ICD-10-CM | POA: Diagnosis not present

## 2022-02-10 DIAGNOSIS — C434 Malignant melanoma of scalp and neck: Secondary | ICD-10-CM | POA: Diagnosis not present

## 2022-02-10 DIAGNOSIS — Z809 Family history of malignant neoplasm, unspecified: Secondary | ICD-10-CM | POA: Diagnosis not present

## 2022-02-10 LAB — BASIC METABOLIC PANEL
Anion gap: 11 (ref 5–15)
BUN: 23 mg/dL (ref 8–23)
CO2: 23 mmol/L (ref 22–32)
Calcium: 9.4 mg/dL (ref 8.9–10.3)
Chloride: 105 mmol/L (ref 98–111)
Creatinine, Ser: 1.03 mg/dL (ref 0.61–1.24)
GFR, Estimated: >60 mL/min (ref >60)
Glucose, Bld: 148 mg/dL — ABNORMAL HIGH (ref 70–99)
Potassium: 4.8 mmol/L (ref 3.5–5.1)
Sodium: 139 mmol/L (ref 135–145)

## 2022-02-10 NOTE — Progress Notes (Signed)

## 2022-02-13 ENCOUNTER — Other Ambulatory Visit: Payer: Self-pay

## 2022-02-13 ENCOUNTER — Encounter (HOSPITAL_BASED_OUTPATIENT_CLINIC_OR_DEPARTMENT_OTHER): Payer: Self-pay | Admitting: General Surgery

## 2022-02-13 ENCOUNTER — Ambulatory Visit (HOSPITAL_BASED_OUTPATIENT_CLINIC_OR_DEPARTMENT_OTHER): Payer: Medicare Other | Admitting: Anesthesiology

## 2022-02-13 ENCOUNTER — Encounter (HOSPITAL_BASED_OUTPATIENT_CLINIC_OR_DEPARTMENT_OTHER)
Admission: RE | Disposition: A | Discharge status: Home / Self Care | Payer: Self-pay | Source: Home / Self Care | Attending: General Surgery

## 2022-02-13 ENCOUNTER — Ambulatory Visit (HOSPITAL_BASED_OUTPATIENT_CLINIC_OR_DEPARTMENT_OTHER)
Admission: RE | Admit: 2022-02-13 | Discharge: 2022-02-13 | Disposition: A | Discharge status: Home / Self Care | Payer: Medicare Other | Attending: General Surgery | Admitting: General Surgery

## 2022-02-13 DIAGNOSIS — Z800 Family history of malignant neoplasm of digestive organs: Secondary | ICD-10-CM | POA: Diagnosis not present

## 2022-02-13 DIAGNOSIS — Z86010 Personal history of colonic polyps: Secondary | ICD-10-CM | POA: Diagnosis not present

## 2022-02-13 DIAGNOSIS — E669 Obesity, unspecified: Secondary | ICD-10-CM | POA: Insufficient documentation

## 2022-02-13 DIAGNOSIS — Z6833 Body mass index (BMI) 33.0-33.9, adult: Secondary | ICD-10-CM | POA: Diagnosis not present

## 2022-02-13 DIAGNOSIS — E119 Type 2 diabetes mellitus without complications: Secondary | ICD-10-CM

## 2022-02-13 DIAGNOSIS — M1990 Unspecified osteoarthritis, unspecified site: Secondary | ICD-10-CM

## 2022-02-13 DIAGNOSIS — C434 Malignant melanoma of scalp and neck: Secondary | ICD-10-CM

## 2022-02-13 DIAGNOSIS — Z803 Family history of malignant neoplasm of breast: Secondary | ICD-10-CM | POA: Insufficient documentation

## 2022-02-13 DIAGNOSIS — K219 Gastro-esophageal reflux disease without esophagitis: Secondary | ICD-10-CM | POA: Diagnosis not present

## 2022-02-13 HISTORY — PX: MELANOMA EXCISION: SHX5266

## 2022-02-13 LAB — GLUCOSE, CAPILLARY
Glucose-Capillary: 165 mg/dL — ABNORMAL HIGH (ref 70–99)
Glucose-Capillary: 147 mg/dL — ABNORMAL HIGH (ref 70–99)

## 2022-02-13 SURGERY — EXCISION, MELANOMA
Anesthesia: General | Site: Neck | Laterality: Right

## 2022-02-13 MED ORDER — PROPOFOL 10 MG/ML IV BOLUS
INTRAVENOUS | Status: AC
Start: 2022-02-13 — End: ?
  Filled 2022-02-13: qty 20

## 2022-02-13 MED ORDER — MIDAZOLAM HCL 2 MG/2ML IJ SOLN
INTRAMUSCULAR | Status: AC
  Filled 2022-02-13: qty 2

## 2022-02-13 MED ORDER — KETOROLAC TROMETHAMINE 30 MG/ML IJ SOLN
INTRAMUSCULAR | Status: AC
  Filled 2022-02-13: qty 1

## 2022-02-13 MED ORDER — FENTANYL CITRATE (PF) 100 MCG/2ML IJ SOLN
INTRAMUSCULAR | Status: AC
  Filled 2022-02-13: qty 2

## 2022-02-13 MED ORDER — OXYCODONE HCL 5 MG PO TABS: 5.0000 mg | ORAL_TABLET | Freq: Once | ORAL | Status: DC | PRN

## 2022-02-13 MED ORDER — ACETAMINOPHEN 500 MG PO TABS
ORAL_TABLET | ORAL | Status: AC
  Filled 2022-02-13: qty 2

## 2022-02-13 MED ORDER — OXYCODONE HCL 5 MG/5ML PO SOLN: 5.0000 mg | Freq: Once | ORAL | Status: DC | PRN

## 2022-02-13 MED ORDER — CYCLOBENZAPRINE HCL 10 MG PO TABS: 10.0000 mg | ORAL_TABLET | Freq: Three times a day (TID) | ORAL | 1 refills | Status: DC | PRN

## 2022-02-13 MED ORDER — ROCURONIUM BROMIDE 10 MG/ML (PF) SYRINGE
PREFILLED_SYRINGE | INTRAVENOUS | Status: AC
  Filled 2022-02-13: qty 10

## 2022-02-13 MED ORDER — LIDOCAINE-EPINEPHRINE (PF) 1 %-1:200000 IJ SOLN
INTRAMUSCULAR | Status: DC | PRN
  Administered 2022-02-13: 20 mL

## 2022-02-13 MED ORDER — AMISULPRIDE (ANTIEMETIC) 5 MG/2ML IV SOLN: 10.0000 mg | Freq: Once | INTRAVENOUS | Status: DC | PRN

## 2022-02-13 MED ORDER — 0.9 % SODIUM CHLORIDE (POUR BTL) OPTIME
TOPICAL | Status: DC | PRN
  Administered 2022-02-13: 100 mL

## 2022-02-13 MED ORDER — OXYCODONE HCL 5 MG PO TABS: 5.0000 mg | ORAL_TABLET | Freq: Four times a day (QID) | ORAL | 0 refills | Status: DC | PRN

## 2022-02-13 MED ORDER — FENTANYL CITRATE (PF) 100 MCG/2ML IJ SOLN
INTRAMUSCULAR | Status: DC | PRN
  Administered 2022-02-13: 50 ug via INTRAVENOUS
  Administered 2022-02-13: 100 ug via INTRAVENOUS
  Administered 2022-02-13 (×2): 50 ug via INTRAVENOUS

## 2022-02-13 MED ORDER — ONDANSETRON HCL 4 MG/2ML IJ SOLN
INTRAMUSCULAR | Status: DC | PRN
  Administered 2022-02-13: 4 mg via INTRAVENOUS

## 2022-02-13 MED ORDER — DEXAMETHASONE SODIUM PHOSPHATE 10 MG/ML IJ SOLN
INTRAMUSCULAR | Status: DC | PRN
  Administered 2022-02-13: 5 mg via INTRAVENOUS

## 2022-02-13 MED ORDER — CEFAZOLIN SODIUM-DEXTROSE 2-4 GM/100ML-% IV SOLN
2.0000 g | INTRAVENOUS | Status: AC
  Administered 2022-02-13: 2 g via INTRAVENOUS

## 2022-02-13 MED ORDER — ROCURONIUM BROMIDE 100 MG/10ML IV SOLN
INTRAVENOUS | Status: DC | PRN
Start: 2022-02-13 — End: 2022-02-13
  Administered 2022-02-13: 100 mg via INTRAVENOUS

## 2022-02-13 MED ORDER — PROPOFOL 10 MG/ML IV BOLUS
INTRAVENOUS | Status: DC | PRN
  Administered 2022-02-13: 200 mg via INTRAVENOUS

## 2022-02-13 MED ORDER — LACTATED RINGERS IV SOLN: INTRAVENOUS | Status: DC

## 2022-02-13 MED ORDER — ACETAMINOPHEN 500 MG PO TABS
1000.0000 mg | ORAL_TABLET | ORAL | Status: AC
Start: 2022-02-13 — End: 2022-02-13
  Administered 2022-02-13: 1000 mg via ORAL

## 2022-02-13 MED ORDER — LIDOCAINE 2% (20 MG/ML) 5 ML SYRINGE
INTRAMUSCULAR | Status: AC
  Filled 2022-02-13: qty 5

## 2022-02-13 MED ORDER — CHLORHEXIDINE GLUCONATE CLOTH 2 % EX PADS: 6.0000 | MEDICATED_PAD | Freq: Once | CUTANEOUS | Status: DC

## 2022-02-13 MED ORDER — CEFAZOLIN SODIUM-DEXTROSE 2-4 GM/100ML-% IV SOLN
INTRAVENOUS | Status: AC
  Filled 2022-02-13: qty 100

## 2022-02-13 MED ORDER — PROMETHAZINE HCL 25 MG/ML IJ SOLN: 6.2500 mg | INTRAMUSCULAR | Status: DC | PRN

## 2022-02-13 MED ORDER — LIDOCAINE 2% (20 MG/ML) 5 ML SYRINGE
INTRAMUSCULAR | Status: AC
Start: 2022-02-13 — End: ?
  Filled 2022-02-13: qty 5

## 2022-02-13 MED ORDER — SUGAMMADEX SODIUM 500 MG/5ML IV SOLN
INTRAVENOUS | Status: DC | PRN
  Administered 2022-02-13: 444.8 mg via INTRAVENOUS

## 2022-02-13 MED ORDER — LIDOCAINE 2% (20 MG/ML) 5 ML SYRINGE
INTRAMUSCULAR | Status: DC | PRN
  Administered 2022-02-13: 100 mg via INTRAVENOUS

## 2022-02-13 MED ORDER — MEPERIDINE HCL 25 MG/ML IJ SOLN: 6.2500 mg | INTRAMUSCULAR | Status: DC | PRN

## 2022-02-13 MED ORDER — SUGAMMADEX SODIUM 500 MG/5ML IV SOLN
INTRAVENOUS | Status: AC
  Filled 2022-02-13: qty 10

## 2022-02-13 MED ORDER — MIDAZOLAM HCL 5 MG/5ML IJ SOLN
INTRAMUSCULAR | Status: DC | PRN
  Administered 2022-02-13: 2 mg via INTRAVENOUS

## 2022-02-13 MED ORDER — HYDROMORPHONE HCL 1 MG/ML IJ SOLN: 0.2500 mg | INTRAMUSCULAR | Status: DC | PRN

## 2022-02-13 SURGICAL SUPPLY — 59 items
ADH SKN CLS APL DERMABOND .7 (GAUZE/BANDAGES/DRESSINGS)
APL PRP STRL LF DISP 70% ISPRP (MISCELLANEOUS) ×1
APL SKNCLS STERI-STRIP NONHPOA (GAUZE/BANDAGES/DRESSINGS) ×1
BENZOIN TINCTURE PRP APPL 2/3 (GAUZE/BANDAGES/DRESSINGS) ×1 IMPLANT
BLADE CLIPPER SURG (BLADE) ×1 IMPLANT
BLADE HEX COATED 2.75 (ELECTRODE) ×1 IMPLANT
BLADE SURG 10 STRL SS (BLADE) ×2 IMPLANT
BLADE SURG 15 STRL LF DISP TIS (BLADE) ×1 IMPLANT
BLADE SURG 15 STRL SS (BLADE) ×2
BNDG COHESIVE 4X5 TAN ST LF (GAUZE/BANDAGES/DRESSINGS) IMPLANT
CANISTER SUCT 1200ML W/VALVE (MISCELLANEOUS) IMPLANT
CHLORAPREP W/TINT 26 (MISCELLANEOUS) ×2 IMPLANT
CLIP TI MEDIUM 6 (CLIP) IMPLANT
COVER BACK TABLE 60X90IN (DRAPES) ×1 IMPLANT
COVER MAYO STAND STRL (DRAPES) ×1 IMPLANT
COVER PROBE W GEL 5X96 (DRAPES) IMPLANT
DERMABOND ADVANCED (GAUZE/BANDAGES/DRESSINGS)
DERMABOND ADVANCED .7 DNX12 (GAUZE/BANDAGES/DRESSINGS) ×1 IMPLANT
DRAPE LAPAROTOMY 100X72 PEDS (DRAPES) IMPLANT
DRAPE U-SHAPE 76X120 STRL (DRAPES) ×2 IMPLANT
DRAPE UTILITY XL STRL (DRAPES) ×2 IMPLANT
DRSG TEGADERM 4X4.75 (GAUZE/BANDAGES/DRESSINGS) ×2 IMPLANT
ELECT REM PT RETURN 9FT ADLT (ELECTROSURGICAL) ×2
ELECTRODE REM PT RTRN 9FT ADLT (ELECTROSURGICAL) ×1 IMPLANT
GAUZE SPONGE 4X4 12PLY STRL LF (GAUZE/BANDAGES/DRESSINGS) ×2 IMPLANT
GLOVE SURG ENC MOIS LTX SZ6 (GLOVE) ×2 IMPLANT
GLOVE SURG UNDER POLY LF SZ6.5 (GLOVE) ×1 IMPLANT
GOWN STRL REUS W/ TWL LRG LVL3 (GOWN DISPOSABLE) ×1 IMPLANT
GOWN STRL REUS W/TWL 2XL LVL3 (GOWN DISPOSABLE) ×2 IMPLANT
GOWN STRL REUS W/TWL LRG LVL3 (GOWN DISPOSABLE) ×2
NDL HYPO 25X1 1.5 SAFETY (NEEDLE) ×1 IMPLANT
NDL SAFETY ECLIPSE 18X1.5 (NEEDLE) IMPLANT
NEEDLE HYPO 18GX1.5 SHARP (NEEDLE)
NEEDLE HYPO 25X1 1.5 SAFETY (NEEDLE) ×2 IMPLANT
NS IRRIG 1000ML POUR BTL (IV SOLUTION) ×1 IMPLANT
PACK BASIN DAY SURGERY FS (CUSTOM PROCEDURE TRAY) ×2 IMPLANT
PACK UNIVERSAL I (CUSTOM PROCEDURE TRAY) IMPLANT
PENCIL SMOKE EVACUATOR (MISCELLANEOUS) ×2 IMPLANT
SLEEVE SCD COMPRESS KNEE MED (STOCKING) ×1 IMPLANT
SPIKE FLUID TRANSFER (MISCELLANEOUS) IMPLANT
SPONGE T-LAP 18X18 ~~LOC~~+RFID (SPONGE) ×2 IMPLANT
STAPLER VISISTAT 35W (STAPLE) IMPLANT
STOCKINETTE IMPERVIOUS LG (DRAPES) IMPLANT
STRIP CLOSURE SKIN 1/2X4 (GAUZE/BANDAGES/DRESSINGS) ×1 IMPLANT
SUT ETHILON 2 0 FS 18 (SUTURE) ×1 IMPLANT
SUT MNCRL AB 4-0 PS2 18 (SUTURE) ×2 IMPLANT
SUT SILK 2 0 SH (SUTURE) ×2 IMPLANT
SUT SILK 3 0 TIES 17X18 (SUTURE)
SUT SILK 3-0 18XBRD TIE BLK (SUTURE) IMPLANT
SUT VIC AB 2-0 SH 27 (SUTURE) ×4
SUT VIC AB 2-0 SH 27XBRD (SUTURE) IMPLANT
SUT VIC AB 3-0 SH 27 (SUTURE) ×4
SUT VIC AB 3-0 SH 27X BRD (SUTURE) ×1 IMPLANT
SUT VICRYL 4-0 PS2 18IN ABS (SUTURE) ×1 IMPLANT
SYR CONTROL 10ML LL (SYRINGE) ×2 IMPLANT
SYR TB 1ML LL NO SAFETY (SYRINGE) ×1 IMPLANT
TOWEL GREEN STERILE FF (TOWEL DISPOSABLE) ×2 IMPLANT
TUBE CONNECTING 20X1/4 (TUBING) IMPLANT
YANKAUER SUCT BULB TIP NO VENT (SUCTIONS) IMPLANT

## 2022-02-13 NOTE — Op Note (Signed)
PRE-OPERATIVE DIAGNOSIS: cT1aN0 right neck melanoma  POST-OPERATIVE DIAGNOSIS:  Same  PROCEDURE:  Procedure(s): Wide local excision 1 cm margins, advancement flap closure for defect 9 cm x 4.6 cm  SURGEON:  Surgeon(s): [name], MD  ANESTHESIA:   local and general  DRAINS: none   LOCAL MEDICATIONS USED:  MARCAINE    and XYLOCAINE   SPECIMEN:  Source of Specimen:  wide local excision right neck melanoma   FINDINGS:  flat pigmented lesion   DISPOSITION OF SPECIMEN:  PATHOLOGY  COUNTS:  YES  PLAN OF CARE: Discharge to home after PACU  PATIENT DISPOSITION:  PACU - hemodynamically stable.    PROCEDURE:   Pt was identified in the holding area, taken to the OR, and placed supine on the OR table.  General anesthesia was induced.      The patient's head was rotated to the left.   The right neck was prepped and draped in sterile fashion.  Time out was performed according to the surgical safety checklist.  When all was correct, we continued.  The melanoma was identified and 1 cm margins were marked out.  Local was administered under the melanoma and the adjacent tissue.  A #10 blade was used to incise the skin around the melanoma.  The cautery was used to take the dissection down to the fascia.  The skin was marked in situ with orientation sutures.  The cautery was used to take the specimen off the fascia, and it was passed off the table.    Skin hooks were used to elevate the edges of the incision and the skin was freed up in all directions with the cautery to create the advancement flaps.  This was pulled together in a longitudinal orientation. The skin was pulled together to check the tension. . Deep interrupted 2-0 vicryl sutures were placed to relieve tension.  The skin was then reapproximated with 3-0 interrupted vicryl deep dermal sutures and 4-0 monocryl running subcuticular sutures.  Three 2-0 nylon horizontal mattress sutures were placed as well.    The melanoma site was  cleaned, dried, and dressed with Benzoin, steristrips, gauze, and tegaderm.    Needle, sponge, and instrument counts were correct.  The patient was awakened from anesthesia and taken to the PACU in stable condition.

## 2022-02-13 NOTE — H&P (Signed)
REFERRING PHYSICIAN: Janus Molder*  PROVIDER: Georgianne Fick, MD  MRN: [id number] DOB: [birth year] DATE OF ENCOUNTER: 02/10/2022 Subjective   Chief Complaint: Melanoma   History of Present Illness: William Webster is a 71 y.o. male who is seen today as an office consultation at the request of Dr. Dianah Field for evaluation of Melanoma .  Patient is a 71 year old male who presents with a new diagnosis of malignant melanoma of the skin of the right neck February 2023. He has had a pigmented lesion as long as he can remember at this site. He always thought it was a birthmark. He and his wife of 40 years did not think it had changed significantly. However, the patient's sister thought that it started to look different in terms of the color. Dr. Dwana Melena agreed. We performed a punch biopsy that showed a superficial spreading melanoma B[place]low thickness 0.5 mm. Deep margins are free. Ulceration, satellitosis, lymphovascular invasion, neurotropism were all absent. There were none brisk tumor infiltrating lymphocytes. There was partial tumor regression.  He denies other history of melanoma. He has not had invasive cancer before this. However, he does have a history of colon polyps and has had more frequent colonoscopies due to this. He has a family history of a sister with breast cancer and a mother with colon cancer.   Review of Systems: A complete review of systems was obtained from the patient. I have reviewed this information and discussed as appropriate with the patient. See HPI as well for other ROS.  Review of Systems  All other systems reviewed and are negative.   Medical History: Past Medical History:  Diagnosis Date   Arthritis   Diabetes mellitus without complication (CMS-HCC)   GERD (gastroesophageal reflux disease)   Hyperlipidemia   Patient Active Problem List  Diagnosis   Malignant melanoma of right side of neck (CMS-HCC)   Family history of cancer    Past Surgical History:  Procedure Laterality Date   double ankle fracture repair N/A    No Known Allergies  Current Outpatient Medications on File Prior to Visit  Medication Sig Dispense Refill   cabergoline (DOSTINEX) 0.5 mg tablet TAKE 1 TABLET TWICE WEEKLY   glipiZIDE (GLUCOTROL) 10 MG tablet Take by mouth   metFORMIN (GLUCOPHAGE) 1000 MG tablet Take by mouth   lisinopriL (ZESTRIL) 2.5 MG tablet Take 2.5 mg by mouth once daily   pioglitazone (ACTOS) 15 MG tablet Take 15 mg by mouth once daily   rosuvastatin (CRESTOR) 10 MG tablet Take 10 mg by mouth once daily   No current facility-administered medications on file prior to visit.   Family History  Problem Relation Age of Onset   Stroke Mother   Skin cancer Mother   Heart valve disease Mother   Diabetes Mother   Colon cancer Mother   Stroke Father   Hyperlipidemia (Elevated cholesterol) Father   Skin cancer Sister   Breast cancer Sister    Social History   Tobacco Use  Smoking Status Never  Smokeless Tobacco Never    Social History   Socioeconomic History   Marital status: Married  Tobacco Use   Smoking status: Never   Smokeless tobacco: Never  Vaping Use   Vaping Use: Never used  Substance and Sexual Activity   Alcohol use: Yes   Drug use: Never   Objective:   Vitals:  02/10/22 1114  BP: 138/72  Pulse: 97  Temp: 36.7 C (98.1 F)  SpO2: 96%  Weight: (!) 113.3 kg (249 lb  12.8 oz)  Height: 185.4 cm (6\' 1" )   Body mass index is 32.96 kg/m.  Head: Normocephalic and atraumatic.  Mouth/Throat: Oropharynx is clear and moist. No oropharyngeal exudate.  Eyes: Conjunctivae are normal. Pupils are equal, round, and reactive to light. No scleral icterus.  Neck: Normal range of motion. Neck supple. No tracheal deviation present. No thyromegaly present.  Cardiovascular: Normal rate, regular rhythm, normal heart sounds and intact distal pulses. Exam reveals no gallop and no friction rub.  No murmur  heard. Respiratory: Effort normal and breath sounds normal. No respiratory distress. No wheezes, rales or rhonchi. No chest wall tenderness.  GI: Soft. Bowel sounds are normal. Abdomen is soft, non tender, non distended. No masses or hepatosplenomegaly is present. There is no rebound and no guarding.  Musculoskeletal: . Extremities are non tender and without deformity.  Lymphadenopathy: No cervical or axillary adenopathy.  Neurological: Alert and oriented to person, place, and time. Coordination normal.  Skin: Right neck with 1.2x2.5 cm pigmented lesion. This is flush with the surrounding skin. There is some pigment difference throughout the lesion. Some is darker brown and some is lighter. This is over the SCM. Skin is warm and dry. No rash noted. No diaphoresis. No erythema. No pallor.  Psychiatric: Normal mood and affect.Behavior is normal. Judgment and thought content normal.   Labs, Imaging and Diagnostic Testing: Pathology 01/27/2022  MELANOMA TABLE (AJCC 8TH EDITION*) PROCEDURE: NOT SPECIFIED (PUNCH) SPECIMEN ANATOMIC SITE: RIGHT SIDE OF NECK HISTOLOGIC TYPE: SUPERFICIAL SPREADING BRESLOW'S DEPTH/MAXIMUM TUMOR THICKNESS: 0.5 MM CLARK/ANATOMIC LEVEL: II MARGINS PERIPHERAL MARGINS: INVOLVED DEEP MARGIN: FREE ULCERATION: ABSENT SATELLITOSIS: ABSENT MITOTIC INDEX: <1/MM2 (0) LYMPHO-VASCULAR INVASION: ABSENT NEUROTROPISM: ABSENT TUMOR-INFILTRATING LYMPHOCYTES: NON-BRISK TUMOR REGRESSION: FOCAL, PARTIAL LYMPH NODES (IF APPLICABLE): N/A PATHOLOGIC STAGE: PT1A COMMENT: A COMPLETE RE-EXCISION IS RECOMMENDED.  Assessment and Plan:   Diagnoses and all orders for this visit:  Malignant melanoma of right side of neck (CMS-HCC) - Ambulatory Referral to Dermatology  Family history of cancer   Patient has a new diagnosis of stage I melanoma of the right neck. This is a pT1a. I will plan a wide local excision with advancement flap closure of this lesion. The peripheral margins are  positive, but the deep margins are negative. I am not planning on doing a sentinel lymph node biopsy due to the flatness of the lesion. The entire lesion feels flush with the skin. He also has no poor prognostic features of the melanoma.  I discussed the procedure with the patient. I reviewed that he would be under general anesthesia. I discussed that there would be a large incision because of the 1 cm margin surrounding the lesion that is required. I reviewed that the center of the lesion would be under tension and this would affect the motion of his neck initially. I discussed that this will soften up over time, but that it will take multiple months for that to occur.  I discussed that the risk of the surgery are primarily related to the wound. I discussed that he will definitely have surrounding numbness that will likely be permanent. I reviewed the risk of bleeding, infection, wound breakdown, cancer recurrence. I discussed that there are cardiac and pulmonary risk associated with general anesthesia as well as blood clots.  I will also refer him to dermatology. The patient's wife has seen [facility] dermatology in [place] and desires a referral there.  Given the patient's personal history of colon polyps in addition to his family history of colon cancer and  breast cancer, I have contacted genetics to see if he qualifies for genetic testing.

## 2022-02-13 NOTE — Transfer of Care (Signed)
Immediate Anesthesia Transfer of Care Note  Patient: William Webster  Procedure(s) Performed: WIDE LOCAL EXCISION WITH ADVANCE FLAP CLOSURE FOR MELANOMA RIGHT NECK (Right: Neck)  Patient Location: PACU  Anesthesia Type:General  Level of Consciousness: awake  Airway & Oxygen Therapy: Patient Spontanous Breathing and Patient connected to face mask oxygen  Post-op Assessment: Report given to RN and Post -op Vital signs reviewed and stable  Post vital signs: Reviewed and stable  Last Vitals:  Vitals Value Taken Time  BP    Temp    Pulse 89 02/13/22 1621  Resp 18 02/13/22 1621  SpO2 97 % 02/13/22 1621  Vitals shown include unvalidated device data.  Last Pain:  Vitals:   02/13/22 1154  TempSrc: Oral  PainSc: 0-No pain      Patients Stated Pain Goal: 3 (73/57/89 [id number])  Complications: No notable events documented.

## 2022-02-13 NOTE — Discharge Instructions (Addendum)
[place] Office Phone Number [phone]   POST OP INSTRUCTIONS  Always review your discharge instruction sheet given to you by the facility where your surgery was performed.  IF YOU HAVE DISABILITY OR FAMILY LEAVE FORMS, YOU MUST BRING THEM TO THE OFFICE FOR PROCESSING.  DO NOT GIVE THEM TO YOUR DOCTOR.  A prescription for pain medication may be given to you upon discharge.  Take your pain medication as prescribed, if needed.  If narcotic pain medicine is not needed, then you may take acetaminophen (Tylenol) or ibuprofen (Advil) as needed. NEXT DOSE OF TYLENOL AFTER 6 PM AS NEEDED FOR PAIN. Take your usually prescribed medications unless otherwise directed If you need a refill on your pain medication, please contact your pharmacy.  They will contact our office to request authorization.  Prescriptions will not be filled after 5pm or on week-ends. You should eat very light the first 24 hours after surgery, such as soup, crackers, pudding, etc.  Resume your normal diet the day after surgery It is common to experience some constipation if taking pain medication after surgery.  Increasing fluid intake and taking a stool softener will usually help or prevent this problem from occurring.  A mild laxative (Milk of Magnesia or Miralax) should be taken according to package directions if there are no bowel movements after 48 hours. You may shower Wednesday, February 22.  Take the first shower with the dressing on.  Then remove the dressing.  On Thursday, you may take a shower with the dressing off and pat dry.   ACTIVITIES:  No strenuous activity or heavy lifting for 1-2 weeks.   You may drive when you no longer are taking prescription pain medication, you can comfortably wear a seatbelt, and you can safely maneuver your car and apply brakes. RETURN TO WORK:  __________5-12 days depending on neck mobility_______________ You should see your doctor in the office for a follow-up appointment  approximately three-four weeks after your surgery.    WHEN TO CALL YOUR DOCTOR: Fever over 101.0 Nausea and/or vomiting. Extreme swelling or bruising. Continued bleeding from incision. Increased pain, redness, or drainage from the incision.  The clinic staff is available to answer your questions during regular business hours.  Please dont hesitate to call and ask to speak to one of the nurses for clinical concerns.  If you have a medical emergency, go to the nearest emergency room or call 911.  A surgeon from [facility] is always on call at the hospital.  For further questions, please visit [email or web address]    Post Anesthesia Home Care Instructions  Activity: Get plenty of rest for the remainder of the day. A responsible individual must stay with you for 24 hours following the procedure.  For the next 24 hours, DO NOT: -Drive a car -Paediatric nurse -Drink alcoholic beverages -Take any medication unless instructed by your physician -Make any legal decisions or sign important papers.  Meals: Start with liquid foods such as gelatin or soup. Progress to regular foods as tolerated. Avoid greasy, spicy, heavy foods. If nausea and/or vomiting occur, drink only clear liquids until the nausea and/or vomiting subsides. Call your physician if vomiting continues.  Special Instructions/Symptoms: Your throat may feel dry or sore from the anesthesia or the breathing tube placed in your throat during surgery. If this causes discomfort, gargle with warm salt water. The discomfort should disappear within 24 hours.  If you had a scopolamine patch placed behind your ear for the management of post-  operative nausea and/or vomiting:  1. The medication in the patch is effective for 72 hours, after which it should be removed.  Wrap patch in a tissue and discard in the trash. Wash hands thoroughly with soap and water. 2. You may remove the patch earlier than 72 hours if you  experience unpleasant side effects which may include dry mouth, dizziness or visual disturbances. 3. Avoid touching the patch. Wash your hands with soap and water after contact with the patch.

## 2022-02-13 NOTE — Anesthesia Postprocedure Evaluation (Signed)
Anesthesia Post Note  Patient: William Webster  Procedure(s) Performed: WIDE LOCAL EXCISION WITH ADVANCE FLAP CLOSURE FOR MELANOMA RIGHT NECK (Right: Neck)     Patient location during evaluation: PACU Anesthesia Type: General Level of consciousness: awake and alert Pain management: pain level controlled Vital Signs Assessment: post-procedure vital signs reviewed and stable Respiratory status: spontaneous breathing, nonlabored ventilation, respiratory function stable and patient connected to nasal cannula oxygen Cardiovascular status: blood pressure returned to baseline and stable Postop Assessment: no apparent nausea or vomiting Anesthetic complications: no   No notable events documented.  Last Vitals:  Vitals:   02/13/22 1645 02/13/22 1700  BP: 130/66 134/60  Pulse: 76 (!) 59  Resp: 15 16  Temp:  (!) 35.7 C  SpO2: 93% 94%    Last Pain:  Vitals:   02/13/22 1700  TempSrc:   PainSc: 0-No pain                 William Webster

## 2022-02-13 NOTE — Interval H&P Note (Signed)
History and Physical Interval Note:  02/13/2022 12:19 PM  William Webster  has presented today for surgery, with the diagnosis of MELANOMA RIGHT NECK.  The various methods of treatment have been discussed with the patient and family. After consideration of risks, benefits and other options for treatment, the patient has consented to  Procedure(s): WIDE LOCAL EXCISION WITH ADVANCE FLAP CLOSURE FOR MELANOMA RIGHT NECK (Right) as a surgical intervention.  The patient's history has been reviewed, patient examined, no change in status, stable for surgery.  I have reviewed the patient's chart and labs.  Questions were answered to the patient's satisfaction.     [name]

## 2022-02-13 NOTE — Anesthesia Preprocedure Evaluation (Signed)
Anesthesia Evaluation  Patient identified by MRN, date of birth, ID band Patient awake    Reviewed: Allergy & Precautions, NPO status , Patient's Chart, lab work & pertinent test results  Airway Mallampati: II  TM Distance: <3 FB Neck ROM: Full    Dental no notable dental hx.    Pulmonary Current Smoker and Patient abstained from smoking.,    Pulmonary exam normal breath sounds clear to auscultation       Cardiovascular negative cardio ROS Normal cardiovascular exam Rhythm:Regular Rate:Normal     Neuro/Psych negative neurological ROS  negative psych ROS   GI/Hepatic Neg liver ROS, GERD  ,  Endo/Other  diabetes, Type 2  Renal/GU negative Renal ROS  negative genitourinary   Musculoskeletal  (+) Arthritis , Osteoarthritis,    Abdominal (+) + obese,   Peds negative pediatric ROS (+)  Hematology negative hematology ROS (+)   Anesthesia Other Findings   Reproductive/Obstetrics negative OB ROS                             Anesthesia Physical  Anesthesia Plan  ASA: III  Anesthesia Plan: General   Post-op Pain Management: GA combined w/ Regional for post-op pain and Dilaudid IV and Minimal or no pain anticipated   Induction: Intravenous  PONV Risk Score and Plan: 1 and Ondansetron and Treatment may vary due to age or medical condition  Airway Management Planned: LMA and Oral ETT  Additional Equipment:   Intra-op Plan:   Post-operative Plan: Extubation in OR  Informed Consent: I have reviewed the patients History and Physical, chart, labs and discussed the procedure including the risks, benefits and alternatives for the proposed anesthesia with the patient or authorized representative who has indicated his/her understanding and acceptance.     Dental advisory given  Plan Discussed with: CRNA and Surgeon  Anesthesia Plan Comments:         Anesthesia Quick Evaluation

## 2022-02-13 NOTE — Anesthesia Procedure Notes (Signed)
Procedure Name: Intubation Date/Time: 02/13/2022 3:18 PM Performed by: Ezequiel Kayser, CRNA Pre-anesthesia Checklist: Patient identified, Emergency Drugs available, Suction available and Patient being monitored Patient Re-evaluated:Patient Re-evaluated prior to induction Oxygen Delivery Method: Circle System Utilized Preoxygenation: Pre-oxygenation with 100% oxygen Induction Type: IV induction Ventilation: Mask ventilation without difficulty Laryngoscope Size: Mac and 4 Grade View: Grade I Tube type: Oral Tube size: 7.0 mm Number of attempts: 1 Airway Equipment and Method: Stylet and Oral airway Placement Confirmation: ETT inserted through vocal cords under direct vision, positive ETCO2 and breath sounds checked- equal and bilateral Secured at: 24 cm Tube secured with: Tape Dental Injury: Teeth and Oropharynx as per pre-operative assessment

## 2022-02-14 ENCOUNTER — Encounter (HOSPITAL_BASED_OUTPATIENT_CLINIC_OR_DEPARTMENT_OTHER): Payer: Self-pay | Admitting: General Surgery

## 2022-02-16 LAB — SURGICAL PATHOLOGY

## 2022-02-22 ENCOUNTER — Other Ambulatory Visit: Payer: Self-pay | Admitting: General Surgery

## 2022-02-22 ENCOUNTER — Telehealth: Payer: Self-pay | Admitting: General Surgery

## 2022-02-22 NOTE — Telephone Encounter (Signed)
Discussed pathology with patient.  Setting up reexcision.  ?

## 2022-03-06 ENCOUNTER — Encounter (HOSPITAL_COMMUNITY): Payer: Self-pay | Admitting: General Surgery

## 2022-03-06 ENCOUNTER — Other Ambulatory Visit: Payer: Self-pay

## 2022-03-06 NOTE — Pre-Procedure Instructions (Signed)
?[id number] [name], [place] - [address] ?[id number]?Phone: [phone]: [phone]? ? ?PCP - TSilverio Decamp MD ? ?EKG - 02/10/22 ? ?ERAS Protcol - Clears until 1100 ? ?COVID TEST- N/A Ambulatory surgery ? ?Anesthesia review: N ? ?Patient verbally denies any shortness of breath, fever, cough and chest pain during phone call ? ? ?-------------  SDW INSTRUCTIONS given: ? ?Your procedure is scheduled on 03/08/22. ? [facility].M., and check in at the Admitting office. ? Call this number if you have problems the morning of surgery: ? [phone] ? ? Remember: ? Do not eat after midnight the night before your surgery ? ?You may drink clear liquids until 0830 the morning of your surgery.   ?Clear liquids allowed are: Water, Non-Citrus Juices (without pulp), Carbonated Beverages, Clear Tea, Black Coffee Only, and Gatorade ?  ? Take these medicines the morning of surgery with A SIP OF WATER  ?None ? ?Do not take any of your diabetes medication morning of surgery ? ?** PLEASE check your blood sugar the morning of your surgery when you wake up and every 2 hours until you get to the Short Stay unit. ? ?If your blood sugar is less than 70 mg/dL, you will need to treat for low blood sugar: ?Do not take insulin. ?Treat a low blood sugar (less than 70 mg/dL) with ? cup of clear juice (cranberry or apple), 4 glucose tablets, OR glucose gel. ?Recheck blood sugar in 15 minutes after treatment (to make sure it is greater than 70 mg/dL). If your blood sugar is not greater than 70 mg/dL on recheck, call [phone] further instructions.  ? ?As of today, STOP taking any Aspirin (unless otherwise instructed by your surgeon) Aleve, Naproxen, Ibuprofen, Motrin, Advil, Goody's, BC's, all herbal medications, fish oil, and all vitamins. ? ?         ?           Do not wear jewelry, make up, or nail polish ?           Do not  wear lotions, powders, perfumes/colognes, or deodorant. ?           Do not shave 48 hours prior to surgery.  Men may shave face and neck. ?           Do not bring valuables to the hospital. ?           [facility] is not responsible for any belongings or valuables. ? ?Do NOT Smoke (Tobacco/Vaping) 24 hours prior to your procedure ?If you use a CPAP at night, you may bring all equipment for your overnight stay. ?  ?Contacts, glasses, dentures or bridgework may not be worn into surgery.    ?  ?For patients admitted to the hospital, discharge time will be determined by your treatment team. ?  ?Patients discharged the day of surgery will not be allowed to drive home, and someone needs to stay with them for 24 hours. ? ? ? ?Special instructions:   ?[facility]- Preparing For Surgery ? ?Before surgery, you can play an important role. Because skin is not sterile, your skin needs to be as free of germs as possible. You can reduce the number of germs on your skin by washing with CHG (chlorahexidine gluconate) Soap before surgery.  CHG is an antiseptic cleaner which kills germs and bonds with the skin to  continue killing germs even after washing.   ? ?Oral Hygiene is also important to reduce your risk of infection.  Remember - BRUSH YOUR TEETH THE MORNING OF SURGERY WITH YOUR REGULAR TOOTHPASTE ? ?Please do not use if you have an allergy to CHG or antibacterial soaps. If your skin becomes reddened/irritated stop using the CHG.  ?Do not shave (including legs and underarms) for at least 48 hours prior to first CHG shower. It is OK to shave your face. ? ?Please follow these instructions carefully. ?  ?Shower the NIGHT BEFORE SURGERY and the MORNING OF SURGERY with DIAL Soap.  ? ?Pat yourself dry with a CLEAN TOWEL. ? ?Wear CLEAN PAJAMAS to bed the night before surgery ? ?Place CLEAN SHEETS on your bed the night of your first shower and DO NOT SLEEP WITH PETS. ? ? ?Day of Surgery: ?Please shower morning of surgery  ?Wear  Clean/Comfortable clothing the morning of surgery ?Do not apply any deodorants/lotions.   ?Remember to brush your teeth WITH YOUR REGULAR TOOTHPASTE. ?  ?Questions were answered. Patient verbalized understanding of instructions.  ? ? ?   ? ?

## 2022-03-08 ENCOUNTER — Ambulatory Visit (HOSPITAL_COMMUNITY): Payer: Medicare Other | Admitting: Certified Registered Nurse Anesthetist

## 2022-03-08 ENCOUNTER — Encounter (HOSPITAL_COMMUNITY): Payer: Self-pay | Admitting: General Surgery

## 2022-03-08 ENCOUNTER — Ambulatory Visit (HOSPITAL_COMMUNITY)
Admission: RE | Admit: 2022-03-08 | Discharge: 2022-03-08 | Disposition: A | Discharge status: Home / Self Care | Payer: Medicare Other | Attending: General Surgery | Admitting: General Surgery

## 2022-03-08 ENCOUNTER — Other Ambulatory Visit: Payer: Self-pay

## 2022-03-08 ENCOUNTER — Ambulatory Visit (HOSPITAL_BASED_OUTPATIENT_CLINIC_OR_DEPARTMENT_OTHER): Payer: Medicare Other | Admitting: Certified Registered Nurse Anesthetist

## 2022-03-08 ENCOUNTER — Encounter (HOSPITAL_COMMUNITY)
Admission: RE | Disposition: A | Discharge status: Home / Self Care | Payer: Self-pay | Source: Home / Self Care | Attending: General Surgery

## 2022-03-08 DIAGNOSIS — Z86010 Personal history of colonic polyps: Secondary | ICD-10-CM | POA: Diagnosis not present

## 2022-03-08 DIAGNOSIS — C434 Malignant melanoma of scalp and neck: Secondary | ICD-10-CM | POA: Insufficient documentation

## 2022-03-08 DIAGNOSIS — Z800 Family history of malignant neoplasm of digestive organs: Secondary | ICD-10-CM | POA: Diagnosis not present

## 2022-03-08 DIAGNOSIS — M1990 Unspecified osteoarthritis, unspecified site: Secondary | ICD-10-CM | POA: Diagnosis not present

## 2022-03-08 DIAGNOSIS — Z803 Family history of malignant neoplasm of breast: Secondary | ICD-10-CM | POA: Insufficient documentation

## 2022-03-08 DIAGNOSIS — Z7984 Long term (current) use of oral hypoglycemic drugs: Secondary | ICD-10-CM | POA: Diagnosis not present

## 2022-03-08 DIAGNOSIS — D034 Melanoma in situ of scalp and neck: Secondary | ICD-10-CM | POA: Diagnosis not present

## 2022-03-08 DIAGNOSIS — E1165 Type 2 diabetes mellitus with hyperglycemia: Secondary | ICD-10-CM

## 2022-03-08 DIAGNOSIS — L904 Acrodermatitis chronica atrophicans: Secondary | ICD-10-CM | POA: Diagnosis not present

## 2022-03-08 LAB — CBC
HCT: 49.3 % (ref 39.0–52.0)
Hemoglobin: 15.9 g/dL (ref 13.0–17.0)
MCH: 29 pg (ref 26.0–34.0)
MCHC: 32.3 g/dL (ref 30.0–36.0)
MCV: 90 fL (ref 80.0–100.0)
Platelets: 113 10*3/uL — ABNORMAL LOW (ref 150–400)
RBC: 5.48 MIL/uL (ref 4.22–5.81)
RDW: 14.7 % (ref 11.5–15.5)
WBC: 6.1 10*3/uL (ref 4.0–10.5)
nRBC: 0 % (ref 0.0–0.2)

## 2022-03-08 LAB — GLUCOSE, CAPILLARY
Glucose-Capillary: 183 mg/dL — ABNORMAL HIGH (ref 70–99)
Glucose-Capillary: 139 mg/dL — ABNORMAL HIGH (ref 70–99)

## 2022-03-08 SURGERY — EXCISION MASS
Anesthesia: General | Site: Neck | Laterality: Right

## 2022-03-08 MED ORDER — LACTATED RINGERS IV SOLN: INTRAVENOUS | Status: DC

## 2022-03-08 MED ORDER — LIDOCAINE 2% (20 MG/ML) 5 ML SYRINGE
INTRAMUSCULAR | Status: AC
  Filled 2022-03-08: qty 15

## 2022-03-08 MED ORDER — HYDROMORPHONE HCL 1 MG/ML IJ SOLN
INTRAMUSCULAR | Status: AC
  Filled 2022-03-08: qty 1

## 2022-03-08 MED ORDER — 0.9 % SODIUM CHLORIDE (POUR BTL) OPTIME
TOPICAL | Status: DC | PRN
  Administered 2022-03-08: 1000 mL

## 2022-03-08 MED ORDER — HYDROMORPHONE HCL 1 MG/ML IJ SOLN
0.2500 mg | INTRAMUSCULAR | Status: DC | PRN
  Administered 2022-03-08: 0.25 mg via INTRAVENOUS

## 2022-03-08 MED ORDER — ONDANSETRON HCL 4 MG/2ML IJ SOLN
INTRAMUSCULAR | Status: DC | PRN
  Administered 2022-03-08: 4 mg via INTRAVENOUS

## 2022-03-08 MED ORDER — EPHEDRINE 5 MG/ML INJ
INTRAVENOUS | Status: AC
  Filled 2022-03-08: qty 5

## 2022-03-08 MED ORDER — DEXMEDETOMIDINE (PRECEDEX) IN NS 20 MCG/5ML (4 MCG/ML) IV SYRINGE
PREFILLED_SYRINGE | INTRAVENOUS | Status: AC
  Filled 2022-03-08: qty 5

## 2022-03-08 MED ORDER — CYCLOBENZAPRINE HCL 10 MG PO TABS: 10.0000 mg | ORAL_TABLET | Freq: Three times a day (TID) | ORAL | 1 refills | Status: DC | PRN

## 2022-03-08 MED ORDER — DEXMEDETOMIDINE (PRECEDEX) IN NS 20 MCG/5ML (4 MCG/ML) IV SYRINGE
PREFILLED_SYRINGE | INTRAVENOUS | Status: DC | PRN
  Administered 2022-03-08: 4 ug via INTRAVENOUS
  Administered 2022-03-08: 16 ug via INTRAVENOUS

## 2022-03-08 MED ORDER — ORAL CARE MOUTH RINSE: 15.0000 mL | Freq: Once | OROMUCOSAL | Status: AC

## 2022-03-08 MED ORDER — ROCURONIUM BROMIDE 10 MG/ML (PF) SYRINGE
PREFILLED_SYRINGE | INTRAVENOUS | Status: DC | PRN
  Administered 2022-03-08: 50 mg via INTRAVENOUS

## 2022-03-08 MED ORDER — INSULIN ASPART 100 UNIT/ML IJ SOLN
INTRAMUSCULAR | Status: AC
  Filled 2022-03-08: qty 1

## 2022-03-08 MED ORDER — CHLORHEXIDINE GLUCONATE CLOTH 2 % EX PADS: 6.0000 | MEDICATED_PAD | Freq: Once | CUTANEOUS | Status: DC

## 2022-03-08 MED ORDER — INSULIN ASPART 100 UNIT/ML IJ SOLN
0.0000 [IU] | INTRAMUSCULAR | Status: DC | PRN
  Administered 2022-03-08: 4 [IU] via SUBCUTANEOUS

## 2022-03-08 MED ORDER — GABAPENTIN 100 MG PO CAPS: 100.0000 mg | ORAL_CAPSULE | Freq: Two times a day (BID) | ORAL | 2 refills | Status: DC

## 2022-03-08 MED ORDER — LIDOCAINE HCL (PF) 1 % IJ SOLN
INTRAMUSCULAR | Status: AC
  Filled 2022-03-08: qty 30

## 2022-03-08 MED ORDER — DROPERIDOL 2.5 MG/ML IJ SOLN: 0.6250 mg | Freq: Once | INTRAMUSCULAR | Status: DC | PRN

## 2022-03-08 MED ORDER — CHLORHEXIDINE GLUCONATE 0.12 % MT SOLN
15.0000 mL | Freq: Once | OROMUCOSAL | Status: AC
  Administered 2022-03-08: 15 mL via OROMUCOSAL
  Filled 2022-03-08: qty 15

## 2022-03-08 MED ORDER — OXYCODONE HCL 5 MG/5ML PO SOLN: 5.0000 mg | Freq: Once | ORAL | Status: DC | PRN

## 2022-03-08 MED ORDER — OXYCODONE HCL 5 MG PO TABS: 5.0000 mg | ORAL_TABLET | Freq: Four times a day (QID) | ORAL | 0 refills | Status: DC | PRN

## 2022-03-08 MED ORDER — LIDOCAINE 2% (20 MG/ML) 5 ML SYRINGE
INTRAMUSCULAR | Status: DC | PRN
  Administered 2022-03-08: 100 mg via INTRAVENOUS

## 2022-03-08 MED ORDER — ONDANSETRON HCL 4 MG/2ML IJ SOLN
INTRAMUSCULAR | Status: AC
  Filled 2022-03-08: qty 6

## 2022-03-08 MED ORDER — BUPIVACAINE-EPINEPHRINE (PF) 0.25% -1:200000 IJ SOLN
INTRAMUSCULAR | Status: DC | PRN
  Administered 2022-03-08: 14 mL

## 2022-03-08 MED ORDER — FENTANYL CITRATE (PF) 250 MCG/5ML IJ SOLN
INTRAMUSCULAR | Status: DC | PRN
Start: 2022-03-08 — End: 2022-03-08
  Administered 2022-03-08 (×3): 50 ug via INTRAVENOUS
  Administered 2022-03-08: 100 ug via INTRAVENOUS

## 2022-03-08 MED ORDER — ONDANSETRON HCL 4 MG/2ML IJ SOLN: 4.0000 mg | Freq: Once | INTRAMUSCULAR | Status: DC | PRN

## 2022-03-08 MED ORDER — FENTANYL CITRATE (PF) 250 MCG/5ML IJ SOLN
INTRAMUSCULAR | Status: AC
  Filled 2022-03-08: qty 5

## 2022-03-08 MED ORDER — ROCURONIUM BROMIDE 10 MG/ML (PF) SYRINGE
PREFILLED_SYRINGE | INTRAVENOUS | Status: AC
  Filled 2022-03-08: qty 40

## 2022-03-08 MED ORDER — OXYCODONE HCL 5 MG PO TABS: 5.0000 mg | ORAL_TABLET | Freq: Once | ORAL | Status: DC | PRN

## 2022-03-08 MED ORDER — CEFAZOLIN SODIUM-DEXTROSE 2-4 GM/100ML-% IV SOLN
2.0000 g | INTRAVENOUS | Status: AC
  Administered 2022-03-08: 2 g via INTRAVENOUS
  Filled 2022-03-08: qty 100

## 2022-03-08 MED ORDER — SUGAMMADEX SODIUM 200 MG/2ML IV SOLN
INTRAVENOUS | Status: DC | PRN
Start: 2022-03-08 — End: 2022-03-08
  Administered 2022-03-08: 200 mg via INTRAVENOUS

## 2022-03-08 MED ORDER — PROPOFOL 10 MG/ML IV BOLUS
INTRAVENOUS | Status: DC | PRN
  Administered 2022-03-08: 200 mg via INTRAVENOUS

## 2022-03-08 MED ORDER — ACETAMINOPHEN 500 MG PO TABS
1000.0000 mg | ORAL_TABLET | ORAL | Status: AC
  Administered 2022-03-08: 1000 mg via ORAL
  Filled 2022-03-08: qty 2

## 2022-03-08 MED ORDER — BUPIVACAINE-EPINEPHRINE (PF) 0.25% -1:200000 IJ SOLN
INTRAMUSCULAR | Status: AC
  Filled 2022-03-08: qty 30

## 2022-03-08 SURGICAL SUPPLY — 38 items
BAG COUNTER SPONGE SURGICOUNT (BAG) ×2 IMPLANT
BENZOIN TINCTURE PRP APPL 2/3 (GAUZE/BANDAGES/DRESSINGS) ×2 IMPLANT
CANISTER SUCT 3000ML PPV (MISCELLANEOUS) ×2 IMPLANT
CHLORAPREP W/TINT 26 (MISCELLANEOUS) ×1 IMPLANT
COVER SURGICAL LIGHT HANDLE (MISCELLANEOUS) ×2 IMPLANT
DERMABOND ADVANCED (GAUZE/BANDAGES/DRESSINGS)
DERMABOND ADVANCED .7 DNX12 (GAUZE/BANDAGES/DRESSINGS) IMPLANT
DRAPE LAPAROSCOPIC ABDOMINAL (DRAPES) IMPLANT
DRAPE LAPAROTOMY 100X72 PEDS (DRAPES) IMPLANT
DRSG TEGADERM 4X4.75 (GAUZE/BANDAGES/DRESSINGS) IMPLANT
ELECT REM PT RETURN 9FT ADLT (ELECTROSURGICAL) ×2
ELECTRODE REM PT RTRN 9FT ADLT (ELECTROSURGICAL) ×1 IMPLANT
GAUZE 4X4 16PLY ~~LOC~~+RFID DBL (SPONGE) ×2 IMPLANT
GAUZE SPONGE 4X4 12PLY STRL (GAUZE/BANDAGES/DRESSINGS) ×1 IMPLANT
GLOVE SURG ENC MOIS LTX SZ6 (GLOVE) ×2 IMPLANT
GLOVE SURG UNDER LTX SZ6.5 (GLOVE) ×2 IMPLANT
GOWN STRL REUS W/ TWL LRG LVL3 (GOWN DISPOSABLE) ×1 IMPLANT
GOWN STRL REUS W/TWL 2XL LVL3 (GOWN DISPOSABLE) ×2 IMPLANT
GOWN STRL REUS W/TWL LRG LVL3 (GOWN DISPOSABLE) ×2
KIT BASIN OR (CUSTOM PROCEDURE TRAY) ×2 IMPLANT
KIT TURNOVER KIT B (KITS) ×2 IMPLANT
NDL HYPO 25GX1X1/2 BEV (NEEDLE) ×1 IMPLANT
NEEDLE HYPO 25GX1X1/2 BEV (NEEDLE) ×2 IMPLANT
NS IRRIG 1000ML POUR BTL (IV SOLUTION) ×2 IMPLANT
PACK GENERAL/GYN (CUSTOM PROCEDURE TRAY) ×2 IMPLANT
PAD ARMBOARD 7.5X6 YLW CONV (MISCELLANEOUS) ×4 IMPLANT
PENCIL SMOKE EVACUATOR (MISCELLANEOUS) ×2 IMPLANT
SPECIMEN JAR SMALL (MISCELLANEOUS) ×1 IMPLANT
SPONGE T-LAP 4X18 ~~LOC~~+RFID (SPONGE) IMPLANT
STRIP CLOSURE SKIN 1/2X4 (GAUZE/BANDAGES/DRESSINGS) ×1 IMPLANT
SUT ETHILON 2 0 FS 18 (SUTURE) ×1 IMPLANT
SUT MON AB 4-0 PC3 18 (SUTURE) ×2 IMPLANT
SUT SILK 2 0 PERMA HAND 18 BK (SUTURE) ×1 IMPLANT
SUT VIC AB 3-0 SH 27 (SUTURE) ×1
SUT VIC AB 3-0 SH 27X BRD (SUTURE) ×1 IMPLANT
SYR CONTROL 10ML LL (SYRINGE) ×2 IMPLANT
TOWEL GREEN STERILE (TOWEL DISPOSABLE) ×2 IMPLANT
TOWEL GREEN STERILE FF (TOWEL DISPOSABLE) ×1 IMPLANT

## 2022-03-08 NOTE — Interval H&P Note (Signed)
History and Physical Interval Note: ? ?03/08/2022 ?9:16 AM ? ?William Webster  has presented today for surgery, with the diagnosis of MELANOMA RIGHT NECK.  He had a partially positive margin for melanoma in situ.  The various methods of treatment have been discussed with the patient and family. After consideration of risks, benefits and other options for treatment, the patient has consented to  Procedure(s): ?REEXCISION RIGHT NECK MELANOMA (Right) as a surgical intervention.  The patient's history has been reviewed, patient examined, no change in status, stable for surgery.  I have reviewed the patient's chart and labs.  Questions were answered to the patient's satisfaction.   ? ? ?[name] ? ? ?

## 2022-03-08 NOTE — Anesthesia Procedure Notes (Signed)
Procedure Name: Intubation ?Date/Time: 03/08/2022 11:05 AM ?Performed by: Minerva Ends, CRNA ?Pre-anesthesia Checklist: Patient identified, Emergency Drugs available, Suction available and Patient being monitored ?Patient Re-evaluated:Patient Re-evaluated prior to induction ?Oxygen Delivery Method: Circle system utilized ?Preoxygenation: Pre-oxygenation with 100% oxygen ?Induction Type: IV induction ?Ventilation: Mask ventilation without difficulty ?Laryngoscope Size: Sabra Heck and 4 ?Grade View: Grade II ?Tube type: Oral ?Tube size: 7.0 mm ?Number of attempts: 1 ?Airway Equipment and Method: Stylet and Oral airway ?Placement Confirmation: ETT inserted through vocal cords under direct vision, positive ETCO2 and breath sounds checked- equal and bilateral ?Secured at: 23 cm ?Tube secured with: Tape ?Dental Injury: Teeth and Oropharynx as per pre-operative assessment  ? ? ? ? ?

## 2022-03-08 NOTE — Anesthesia Postprocedure Evaluation (Signed)
Anesthesia Post Note ? ?Patient: William Webster ? ?Procedure(s) Performed: REEXCISION RIGHT NECK MELANOMA (Right: Neck) ? ?  ? ?Patient location during evaluation: PACU ?Anesthesia Type: General ?Level of consciousness: awake and alert and oriented ?Pain management: pain level controlled ?Vital Signs Assessment: post-procedure vital signs reviewed and stable ?Respiratory status: spontaneous breathing, nonlabored ventilation and respiratory function stable ?Cardiovascular status: blood pressure returned to baseline and stable ?Postop Assessment: no apparent nausea or vomiting ?Anesthetic complications: no ? ? ?No notable events documented. ? ?Last Vitals:  ?Vitals:  ? 03/08/22 1230 03/08/22 1245  ?BP: 119/78 107/72  ?Pulse: 63 60  ?Resp: 13 12  ?Temp:    ?SpO2: 91% 90%  ?  ?Last Pain:  ?Vitals:  ? 03/08/22 1230  ?TempSrc:   ?PainSc: 5   ? ? ?  ?  ?  ?  ?  ?  ? ?Nikole Swartzentruber A. ? ? ? ? ?

## 2022-03-08 NOTE — Anesthesia Preprocedure Evaluation (Signed)
Anesthesia Evaluation  ?Patient identified by MRN, date of birth, ID band ?Patient awake ? ? ? ?Reviewed: ?Allergy & Precautions, NPO status , Patient's Chart, lab work & pertinent test results ? ?Airway ?Mallampati: II ? ?TM Distance: <3 FB ?Neck ROM: Full ? ? ? Dental ?no notable dental hx. ?(+) Teeth Intact, Dental Advisory Given ?  ?Pulmonary ?Current Smoker and Patient abstained from smoking.,  ?  ?Pulmonary exam normal ?breath sounds clear to auscultation ? ? ? ? ? ? Cardiovascular ?negative cardio ROS ?Normal cardiovascular exam ?Rhythm:Regular Rate:Bradycardia ? ? ?  ?Neuro/Psych ?Right sciatica ?Cervical radiculopathy ? Neuromuscular disease negative psych ROS  ? GI/Hepatic ?Neg liver ROS, GERD  Medicated and Controlled,  ?Endo/Other  ?diabetes, Poorly Controlled, Type 2, Oral Hypoglycemic AgentsObesity ? Renal/GU ?negative Renal ROS  ?negative genitourinary ?  ?Musculoskeletal ? ?(+) Arthritis , Osteoarthritis,  Incomplete excision of melanoma  ? Abdominal ?(+) + obese,   ?Peds ?negative pediatric ROS ?(+)  Hematology ?negative hematology ROS ?(+)   ?Anesthesia Other Findings ? ? Reproductive/Obstetrics ?negative OB ROS ? ?  ? ? ? ? ? ? ? ? ? ? ? ? ? ?  ?  ? ? ? ? ? ? ? ? ?Anesthesia Physical ? ?Anesthesia Plan ? ?ASA: 3 ? ?Anesthesia Plan: General  ? ?Post-op Pain Management: GA combined w/ Regional for post-op pain and Dilaudid IV and Minimal or no pain anticipated  ? ?Induction: Intravenous ? ?PONV Risk Score and Plan: 1 and Ondansetron, Treatment may vary due to age or medical condition and Droperidol ? ?Airway Management Planned: Oral ETT and LMA ? ?Additional Equipment:  ? ?Intra-op Plan:  ? ?Post-operative Plan: Extubation in OR ? ?Informed Consent: I have reviewed the patients History and Physical, chart, labs and discussed the procedure including the risks, benefits and alternatives for the proposed anesthesia with the patient or authorized representative who has  indicated his/her understanding and acceptance.  ? ? ? ?Dental advisory given ? ?Plan Discussed with: CRNA and Anesthesiologist ? ?Anesthesia Plan Comments:   ? ? ? ? ? ? ?Anesthesia Quick Evaluation ? ?

## 2022-03-08 NOTE — Discharge Instructions (Addendum)
[facility] Office Phone Number [phone]   POST OP INSTRUCTIONS  Always review your discharge instruction sheet given to you by the facility where your surgery was performed.  IF YOU HAVE DISABILITY OR FAMILY LEAVE FORMS, YOU MUST BRING THEM TO THE OFFICE FOR PROCESSING.  DO NOT GIVE THEM TO YOUR DOCTOR.  Take 2 tylenol (acetominophen) three times a day for 3 days.  If you still have pain, add ibuprofen with food in between if able to take this (if you have kidney issues or stomach issues, do not take ibuprofen).  If both of those are not enough, add the narcotic pain pill.  If you find you are needing a lot of this overnight after surgery, call the next morning for a refill.   Take your usually prescribed medications unless otherwise directed If you need a refill on your pain medication, please contact your pharmacy.  They will contact our office to request authorization.  Prescriptions will not be filled after 5pm or on week-ends. You should eat very light the first 24 hours after surgery, such as soup, crackers, pudding, etc.  Resume your normal diet the day after surgery It is common to experience some constipation if taking pain medication after surgery.  Increasing fluid intake and taking a stool softener will usually help or prevent this problem from occurring.  A mild laxative (Milk of Magnesia or Miralax) should be taken according to package directions if there are no bowel movements after 48 hours. You may shower in 48 hours.  The surgical glue will flake off in 2-3 weeks.   ACTIVITIES:  No strenuous activity or heavy lifting for 1 week.   You may drive when you no longer are taking prescription pain medication, you can comfortably wear a seatbelt, and you can safely maneuver your car and apply brakes. RETURN TO WORK:  __________to be determined. _______________ You should see your doctor in the office for a follow-up appointment approximately three-four weeks after  your surgery.    WHEN TO CALL YOUR DOCTOR: Fever over 101.0 Nausea and/or vomiting. Extreme swelling or bruising. Continued bleeding from incision. Increased pain, redness, or drainage from the incision.  The clinic staff is available to answer your questions during regular business hours.  Please don't hesitate to call and ask to speak to one of the nurses for clinical concerns.  If you have a medical emergency, go to the nearest emergency room or call 911.  A surgeon from [facility] is always on call at the hospital.  For further questions, please visit [email or web address]   

## 2022-03-08 NOTE — Op Note (Signed)
PRE-OPERATIVE DIAGNOSIS: Melanoma right neck ? ?POST-OPERATIVE DIAGNOSIS:  Same ? ?PROCEDURE:  Procedure(s): ?Reexcision right neck melanoma with advancement flap closure defect 3.8 x 6.4 cm ? ?SURGEON:  Surgeon(s): ?[name], MD ? ?ANESTHESIA:   local and general ? ?DRAINS: none  ? ?LOCAL MEDICATIONS USED:  BUPIVICAINE  and LIDOCAINE  ? ?SPECIMEN:  Source of Specimen:  reexcision right neck melanoma ? ?DISPOSITION OF SPECIMEN:  PATHOLOGY ? ?COUNTS:  YES ? ?DICTATION: .Dragon Dictation ? ?PLAN OF CARE: Discharge to home after PACU ? ?PATIENT DISPOSITION:  PACU - hemodynamically stable. ? ?FINDINGS:  no residual pigmented region ? ?EBL: min ? ?PROCEDURE:  ?Patient was identified in the holding area.  He was taken the operating room and placed supine on operating room table.  Sequential compression devices were placed.  General anesthesia was then induced.  His right neck and lower ear were prepped and draped in sterile fashion.  A timeout was performed according to the surgical safety checklist.  When all was correct, we continued. ? ?The previous incision was examined and the prior positive margin had been on the posterior lateral aspect from 8 30-10 30.  The entire central portion of the wound was excised on both sides but with the focus of the portion on the posterior lateral aspect.  This was marked in situ at the previous 830 and 1030 o'clock locations.  Cautery was used to take the dissection down to the muscle fascia.  Skin hooks were used to elevate the adjacent and the cautery was used to create advancement flaps. ? ?The tissue was examined for any sign of hemorrhage.  Hemostasis was achieved with cautery.  2-0 Vicryl sutures were used to pull together the skin deeply.  3-0 Vicryl interrupted deep dermal sutures were placed in between the 2-0 Vicryl's.  Skin was then closed with running 4-0 Monocryl.  Three 2-0 nylon vertical mattress sutures were placed in the central portion of the wound at the site of  the most tension.  The wound was then cleaned, dried, and dressed with benzoin, Steri-Strips, gauze, and Tegaderm. ? ?Patient was then allowed to emerge from anesthesia and was taken to the PACU in stable condition.  Needle, sponge, and instrument counts were correct x2. ?  ? ? ?

## 2022-03-08 NOTE — Transfer of Care (Signed)
Immediate Anesthesia Transfer of Care Note ? ?Patient: William Webster ? ?Procedure(s) Performed: REEXCISION RIGHT NECK MELANOMA (Right: Neck) ? ?Patient Location: PACU ? ?Anesthesia Type:General ? ?Level of Consciousness: awake and alert  ? ?Airway & Oxygen Therapy: Patient Spontanous Breathing and Patient connected to face mask oxygen ? ?Post-op Assessment: Report given to RN and Post -op Vital signs reviewed and stable ? ?Post vital signs: Reviewed and stable ? ?Last Vitals:  ?Vitals Value Taken Time  ?BP 135/80 03/08/22 1158  ?Temp 36.6 ?C 03/08/22 1158  ?Pulse 68 03/08/22 1159  ?Resp 15 03/08/22 1159  ?SpO2 95 % 03/08/22 1159  ?Vitals shown include unvalidated device data. ? ?Last Pain:  ?Vitals:  ? 03/08/22 0853  ?TempSrc:   ?PainSc: 0-No pain  ?   ? ?  ? ?Complications: No notable events documented. ?

## 2022-03-09 ENCOUNTER — Encounter (HOSPITAL_COMMUNITY): Payer: Self-pay | Admitting: General Surgery

## 2022-03-11 LAB — SURGICAL PATHOLOGY

## 2022-03-27 ENCOUNTER — Ambulatory Visit (INDEPENDENT_AMBULATORY_CARE_PROVIDER_SITE_OTHER): Payer: Medicare Other | Admitting: Medical-Surgical

## 2022-03-27 DIAGNOSIS — Z0000 Encounter for general adult medical examination without abnormal findings: Secondary | ICD-10-CM

## 2022-03-27 NOTE — Progress Notes (Signed)
? ? ?MEDICARE ANNUAL WELLNESS VISIT ? ?03/27/2022 ? ?Telephone Visit Disclaimer ?This Medicare AWV was conducted by telephone due to national recommendations for restrictions regarding the COVID-19 Pandemic (e.g. social distancing).  I verified, using two identifiers, that I am speaking with William Webster or their authorized healthcare agent. I discussed the limitations, risks, security, and privacy concerns of performing an evaluation and management service by telephone and the potential availability of an in-person appointment in the future. The patient expressed understanding and agreed to proceed.  ?Location of Patient: Home ?Location of Provider (nurse):  Provider home ? ?Subjective:  ? ? ?William Webster is a 71 y.o. male patient of Thekkekandam, Gwen Her, MD who had a Medicare Annual Wellness Visit today via telephone. William Webster is Retired and lives with their spouse. he has 2 children. he reports that he is socially active and does interact with friends/family regularly. he is minimally physically active and enjoys going to car shows. ? ?Patient Care Team: ?Silverio Decamp, MD as PCP - General (Family Medicine) ? ? ?  03/27/2022  ?  2:21 PM 03/08/2022  ?  9:05 AM 02/13/2022  ? 11:49 AM 02/10/2022  ?  1:44 PM 11/24/2015  ?  7:22 AM 11/23/2015  ? 12:59 PM 08/16/2015  ? 10:27 AM  ?Advanced Directives  ?Does Patient Have a Medical Advance Directive? No No No No No No No  ?Would patient like information on creating a medical advance directive? No - Patient declined No - Patient declined No - Patient declined No - Patient declined   No - patient declined information  ? ? ?Hospital Utilization Over the Past 12 Months: ?# of hospitalizations or ER visits: 0 ?# of surgeries: 2 ? ?Review of Systems    ?Patient reports that his overall health is better compared to last year. ? ?History obtained from chart review and the patient ? ?Patient Reported Readings (BP, Pulse, CBG, Weight, etc) ?none ? ?Pain  Assessment ?Pain : 0-10 ?Pain Score: 2  ?Pain Type: Other (Comment) (Post surgical) ?Pain Location: Head ?Pain Descriptors / Indicators: Aching ?Pain Onset: 1 to 4 weeks ago ?Pain Frequency: Intermittent ? ?  ? ?Current Medications & Allergies (verified) ?Allergies as of 03/27/2022   ?No Known Allergies ?  ? ?  ?Medication List  ?  ? ?  ? Accurate as of March 27, 2022  2:50 PM. If you have any questions, ask your nurse or doctor.  ?  ?  ? ?  ? ?ADULT ONE DAILY GUMMIES PO ?Take 2 tablets by mouth in the morning. ?  ?BENEFIBER PO ?Take 1 Dose by mouth in the morning. ?  ?cabergoline 0.5 MG tablet ?Commonly known as: DOSTINEX ?TAKE 1 TABLET TWICE WEEKLY ?What changed: See the new instructions. ?  ?cyclobenzaprine 10 MG tablet ?Commonly known as: FLEXERIL ?Take 1 tablet (10 mg total) by mouth 3 (three) times daily as needed for muscle spasms. ?  ?gabapentin 100 MG capsule ?Commonly known as: Neurontin ?Take 1 capsule (100 mg total) by mouth 2 (two) times daily. ?  ?glipiZIDE 10 MG tablet ?Commonly known as: GLUCOTROL ?Take 1 tablet (10 mg total) by mouth 2 (two) times daily before a meal. ?  ?Glyxambi 25-5 MG Tabs ?Generic drug: Empagliflozin-linaGLIPtin ?Take 1 tablet by mouth every evening. ?  ?lisinopril 2.5 MG tablet ?Commonly known as: ZESTRIL ?Take 1 tablet (2.5 mg total) by mouth daily. ?  ?metFORMIN 1000 MG tablet ?Commonly known as: GLUCOPHAGE ?TAKE 1 TABLET (1,000 MG TOTAL) BY MOUTH 2 (  TWO) TIMES DAILY WITH A MEAL. ?  ?Multi-Vitamin tablet ?Take 1 tablet by mouth daily. ?  ?oxyCODONE 5 MG immediate release tablet ?Commonly known as: Oxy IR/ROXICODONE ?Take 1 tablet (5 mg total) by mouth every 6 (six) hours as needed for severe pain. ?  ?pioglitazone 30 MG tablet ?Commonly known as: ACTOS ?Take 1 tablet (30 mg total) by mouth daily. ?  ?rosuvastatin 40 MG tablet ?Commonly known as: CRESTOR ?TAKE 1 TABLET (40 MG TOTAL) BY MOUTH DAILY. NEED LABS ?  ? ?  ? ? ?History (reviewed): ?Past Medical History:  ?Diagnosis  Date  ? Anterior pituitary adenoma syndrome ([place])   ? Bimalleolar fracture of right ankle   ? Cancer ([place]) 01/2022  ? Right neck melanoma  ? Colon polyp   ? Diabetes mellitus without complication ([place])   ? Diabetic neuropathy ([place])   ? Family history of cancer of GI tract   ? Family history of colon cancer   ? GERD (gastroesophageal reflux disease)   ? Rosanna Randy disease   ? Hyperlipidemia   ? Low back pain   ? Lumbar scoliosis   ? Prolactinoma ([place])   ? Sciatica of right side   ? Spondylosis of lumbar joint   ? ?Past Surgical History:  ?Procedure Laterality Date  ? COLONOSCOPY  2012  ? [place]  ? ESOPHAGOGASTRODUODENOSCOPY    ? With [facility] he had a bad case of reflux-said it been a few years. Maybe about 10 years or 11 years   ? HEMORRHOID SURGERY    ? MASS EXCISION Right 03/08/2022  ? Procedure: REEXCISION RIGHT NECK MELANOMA;  Surgeon: [name], MD;  Location: [place];  Service: General;  Laterality: Right;  ? MELANOMA EXCISION Right 02/13/2022  ? Procedure: WIDE LOCAL EXCISION WITH ADVANCE FLAP CLOSURE FOR MELANOMA RIGHT NECK;  Surgeon: [name], MD;  Location: [place];  Service: General;  Laterality: Right;  ? ORIF ANKLE FRACTURE Right 11/24/2015  ? Procedure: OPEN REDUCTION INTERNAL FIXATION (ORIF) ANKLE FRACTURE;  Surgeon: Dorna Leitz, MD;  Location: [place];  Service: Orthopedics;  Laterality: Right;  Open reduction internal fixation right ankle fracture  ? POLYPECTOMY    ? TONSILLECTOMY    ? ?Family History  ?Problem Relation Age of Onset  ? Heart failure Mother   ? Diabetes Mother   ? Hyperlipidemia Mother   ? Stroke Mother   ? Colon cancer Mother   ? Stroke Father   ? Hyperlipidemia Father   ? Diabetes Maternal Grandfather   ? Heart disease Maternal Grandfather   ? Stroke Maternal Grandfather   ? Diabetes Paternal Grandfather   ? Heart disease Paternal Grandfather   ? Stroke Paternal Grandfather   ? Esophageal cancer Neg Hx   ? Stomach cancer Neg Hx   ? Rectal  cancer Neg Hx   ? ?Social History  ? ?Socioeconomic History  ? Marital status: Married  ?  Spouse name: Marliss Coots  ? Number of children: 2  ? Years of education: 12th grade  ? Highest education level: 12th grade  ?Occupational History  ?  Comment: Part-time  ?Tobacco Use  ? Smoking status: Former  ?  Types: Cigars  ?  Quit date: 03/2020  ?  Years since quitting: 2.0  ?  Passive exposure: Past  ? Smokeless tobacco: Never  ? Tobacco comments:  ?  occassional cigar-every month stopped two years ago (03/2020)  ?Vaping Use  ? Vaping Use: Never used  ?Substance and Sexual Activity  ?  Alcohol use: Yes  ?  Alcohol/week: 8.0 standard drinks  ?  Types: 8 Standard drinks or equivalent per week  ? Drug use: No  ? Sexual activity: Yes  ?  Birth control/protection: None  ?Other Topics Concern  ? Not on file  ?Social History Narrative  ? Works part-time ([employer]). Lives with wife. He has two daughters (one lives in [place] and one in [place]). Enjoys going to the car shows.   ? ?Social Determinants of Health  ? ?Financial Resource Strain: Low Risk   ? Difficulty of Paying Living Expenses: Not hard at all  ?Food Insecurity: No Food Insecurity  ? Worried About Charity fundraiser in the Last Year: Never true  ? Ran Out of Food in the Last Year: Never true  ?Transportation Needs: No Transportation Needs  ? Lack of Transportation (Medical): No  ? Lack of Transportation (Non-Medical): No  ?Physical Activity: Inactive  ? Days of Exercise per Week: 0 days  ? Minutes of Exercise per Session: 0 min  ?Stress: No Stress Concern Present  ? Feeling of Stress : Not at all  ?Social Connections: Moderately Integrated  ? Frequency of Communication with Friends and Family: More than three times a week  ? Frequency of Social Gatherings with Friends and Family: Once a week  ? Attends Religious Services: 1 to 4 times per year  ? Active Member of Clubs or Organizations: No  ? Attends Archivist Meetings: Never  ? Marital Status: Married   ? ? ?Activities of Daily Living ? ?  03/27/2022  ?  2:27 PM 03/08/2022  ?  9:04 AM  ?In your present state of health, do you have any difficulty performing the following activities:  ?Hearing? 0   ?Vision? 0   ?Difficulty

## 2022-03-27 NOTE — Patient Instructions (Addendum)
?MEDICARE ANNUAL WELLNESS VISIT ?Health Maintenance Summary and Written Plan of Care ? ?William Webster , ? ?Thank you for allowing me to perform your Medicare Annual Wellness Visit and for your ongoing commitment to your health.  ? ?Health Maintenance & Immunization History ?Health Maintenance  ?Topic Date Due  ? OPHTHALMOLOGY EXAM  03/27/2022 (Originally 06/11/2021)  ? COVID-19 Vaccine (5 - Booster for [place] series) 04/24/2022 (Originally 05/03/2021)  ? Pneumonia Vaccine 7+ Years old (2 - PCV) 03/28/2023 (Originally 01/27/2022)  ? COLONOSCOPY (Pts 45-53yr Insurance coverage will need to be confirmed)  07/03/2022  ? INFLUENZA VACCINE  07/25/2022  ? HEMOGLOBIN A1C  07/27/2022  ? FOOT EXAM  01/27/2023  ? TETANUS/TDAP  03/18/2023  ? Hepatitis C Screening  Completed  ? Zoster Vaccines- Shingrix  Completed  ? HPV VACCINES  Aged Out  ? Fecal DNA (Cologuard)  Discontinued  ? ?Immunization History  ?Administered Date(s) Administered  ? Fluad Quad(high Dose 65+) 08/27/2019  ? Influenza, High Dose Seasonal PF 08/13/2020  ? Influenza,inj,Quad PF,6+ Mos 08/23/2015, 09/16/2018, 09/02/2021  ? Influenza-Unspecified 09/07/2016, 10/08/2017  ? PFIZER(Purple Top)SARS-COV-2 Vaccination 03/01/2020, 03/22/2020, 10/14/2020, 03/08/2021  ? Pneumococcal Polysaccharide-23 01/12/2016, 01/27/2021  ? Tdap 03/17/2013  ? Varicella 10/13/2021  ? Zoster Recombinat (Shingrix) 01/27/2021, 10/13/2021  ? Zoster, Live 08/23/2015  ? ? ?These are the patient goals that we discussed: ? Goals Addressed   ? ?  ?  ?  ?  ?  ? This Visit's Progress  ?   Patient Stated (pt-stated)     ?   Patient would like to loose 15 lbs.  ?  ? ?  ?  ? ?This is a list of Health Maintenance Items that are overdue or due now: ?Pneumococcal vaccine ?Eye exam ? ?Orders/Referrals Placed Today: ?No orders of the defined types were placed in this encounter. ? ?(Contact our referral department at [phone] you have not spoken with someone about your referral appointment within  the next 5 days)  ? ? ?Follow-up Plan ?Follow-up with TSilverio Decamp MD as planned ?Schedule your pneumonia vaccine (Prevnar 20) at your pharmacy or at the office. ?Please have your eye exam record faxed to ur office. ?Medicare wellness visit in one year.  ?Patient will access AVS on my chart. ? ? ? ?  ?Health Maintenance, Male ?Adopting a healthy lifestyle and getting preventive care are important in promoting health and wellness. Ask your health care provider about: ?The right schedule for you to have regular tests and exams. ?Things you can do on your own to prevent diseases and keep yourself healthy. ?What should I know about diet, weight, and exercise? ?Eat a healthy diet ? ?Eat a diet that includes plenty of vegetables, fruits, low-fat dairy products, and lean protein. ?Do not eat a lot of foods that are high in solid fats, added sugars, or sodium. ?Maintain a healthy weight ?Body mass index (BMI) is a measurement that can be used to identify possible weight problems. It estimates body fat based on height and weight. Your health care provider can help determine your BMI and help you achieve or maintain a healthy weight. ?Get regular exercise ?Get regular exercise. This is one of the most important things you can do for your health. Most adults should: ?Exercise for at least 150 minutes each week. The exercise should increase your heart rate and make you sweat (moderate-intensity exercise). ?Do strengthening exercises at least twice a week. This is in addition to the moderate-intensity exercise. ?Spend less time sitting. Even  light physical activity can be beneficial. ?Watch cholesterol and blood lipids ?Have your blood tested for lipids and cholesterol at 69 years of age, then have this test every 5 years. ?You may need to have your cholesterol levels checked more often if: ?Your lipid or cholesterol levels are high. ?You are older than 69 years of age. ?You are at high risk for heart disease. ?What  should I know about cancer screening? ?Many types of cancers can be detected early and may often be prevented. Depending on your health history and family history, you may need to have cancer screening at various ages. This may include screening for: ?Colorectal cancer. ?Prostate cancer. ?Skin cancer. ?Lung cancer. ?What should I know about heart disease, diabetes, and high blood pressure? ?Blood pressure and heart disease ?High blood pressure causes heart disease and increases the risk of stroke. This is more likely to develop in people who have high blood pressure readings or are overweight. ?Talk with your health care provider about your target blood pressure readings. ?Have your blood pressure checked: ?Every 3-5 years if you are 28-52 years of age. ?Every year if you are 66 years old or older. ?If you are between the ages of 59 and 31 and are a current or former smoker, ask your health care provider if you should have a one-time screening for abdominal aortic aneurysm (AAA). ?Diabetes ?Have regular diabetes screenings. This checks your fasting blood sugar level. Have the screening done: ?Once every three years after age 46 if you are at a normal weight and have a low risk for diabetes. ?More often and at a younger age if you are overweight or have a high risk for diabetes. ?What should I know about preventing infection? ?Hepatitis B ?If you have a higher risk for hepatitis B, you should be screened for this virus. Talk with your health care provider to find out if you are at risk for hepatitis B infection. ?Hepatitis C ?Blood testing is recommended for: ?Everyone born from [birth year] through 1963. ?Anyone with known risk factors for hepatitis C. ?Sexually transmitted infections (STIs) ?You should be screened each year for STIs, including gonorrhea and chlamydia, if: ?You are sexually active and are younger than 69 years of age. ?You are older than 69 years of age and your health care provider tells you that you are  at risk for this type of infection. ?Your sexual activity has changed since you were last screened, and you are at increased risk for chlamydia or gonorrhea. Ask your health care provider if you are at risk. ?Ask your health care provider about whether you are at high risk for HIV. Your health care provider may recommend a prescription medicine to help prevent HIV infection. If you choose to take medicine to prevent HIV, you should first get tested for HIV. You should then be tested every 3 months for as long as you are taking the medicine. ?Follow these instructions at home: ?Alcohol use ?Do not drink alcohol if your health care provider tells you not to drink. ?If you drink alcohol: ?Limit how much you have to 0-2 drinks a day. ?Know how much alcohol is in your drink. In the [place] one drink equals one 12 oz bottle of beer (355 mL), one 5 oz glass of wine (148 mL), or one 1? oz glass of hard liquor (44 mL). ?Lifestyle ?Do not use any products that contain nicotine or tobacco. These products include cigarettes, chewing tobacco, and vaping devices, such as e-cigarettes. If you  need help quitting, ask your health care provider. ?Do not use street drugs. ?Do not share needles. ?Ask your health care provider for help if you need support or information about quitting drugs. ?General instructions ?Schedule regular health, dental, and eye exams. ?Stay current with your vaccines. ?Tell your health care provider if: ?You often feel depressed. ?You have ever been abused or do not feel safe at home. ?Summary ?Adopting a healthy lifestyle and getting preventive care are important in promoting health and wellness. ?Follow your health care provider's instructions about healthy diet, exercising, and getting tested or screened for diseases. ?Follow your health care provider's instructions on monitoring your cholesterol and blood pressure. ?This information is not intended to replace advice given to you by your health care provider.  Make sure you discuss any questions you have with your health care provider. ?Document Revised: 05/02/2021 Document Reviewed: 05/02/2021 ?Elsevier Patient Education ? [place]. ? ?

## 2022-05-04 ENCOUNTER — Ambulatory Visit (INDEPENDENT_AMBULATORY_CARE_PROVIDER_SITE_OTHER): Payer: Medicare Other | Admitting: Sports Medicine

## 2022-05-04 ENCOUNTER — Encounter: Payer: Self-pay | Admitting: Sports Medicine

## 2022-05-04 VITALS — BP 126/75 | HR 57 | Ht 72.0 in | Wt 261.0 lb

## 2022-05-04 DIAGNOSIS — E119 Type 2 diabetes mellitus without complications: Secondary | ICD-10-CM

## 2022-05-04 LAB — POCT GLYCOSYLATED HEMOGLOBIN (HGB A1C): HbA1c, POC (controlled diabetic range): 7.9 % — AB (ref 0.0–7.0)

## 2022-05-04 NOTE — Assessment & Plan Note (Signed)
Pleasant 71 year old male, diabetes type 2, at the last visit we switched him from [facility] to high-dose glipizide, metformin, Actos. ?A1c 7.9%, up-to-date on other screening measures, he had a diabetic eye exam in March. ?He will work on an exercise prescription, cutting back from 12-6 beers total per week. ?Return to see me in 3 months for repeat A1c, if he has not lost about 10 pounds will consider weight loss medication ?

## 2022-05-04 NOTE — Progress Notes (Signed)
? ? ?  Procedures performed today:   ? ?None. ? ?Independent interpretation of notes and tests performed by another provider:  ? ?None. ? ?Brief History, Exam, Impression, and Recommendations:   ? ?Diabetes mellitus type 2, controlled, without complications ([place]) ?Pleasant 71 year old male, diabetes type 2, at the last visit we switched him from [facility] to high-dose glipizide, metformin, Actos. ?A1c 7.9%, up-to-date on other screening measures, he had a diabetic eye exam in March. ?He will work on an exercise prescription, cutting back from 12-6 beers total per week. ?Return to see me in 3 months for repeat A1c, if he has not lost about 10 pounds will consider weight loss medication ? ?I spent 30 minutes of total time managing this patient today, this includes chart review, face to face, and non-face to face time. ? ?___________________________________________ ?Gwen Her. Dianah Field, M.D., ABFM., CAQSM. ?Primary Care and Sports Medicine ?[place] ? ?Adjunct Instructor of Family Medicine  ?[employer] ?

## 2022-05-04 NOTE — Patient Instructions (Signed)
Goal is heart rate up to 130-140 for 30 minutes 3-5 times a week. ?Cut beers back from 12 to 6/week. ?Goal 10 pound weight loss over the next 3 months. ?

## 2022-06-08 ENCOUNTER — Other Ambulatory Visit: Payer: Self-pay | Admitting: Osteopathic Medicine

## 2022-06-08 DIAGNOSIS — E221 Hyperprolactinemia: Secondary | ICD-10-CM

## 2022-06-12 ENCOUNTER — Emergency Department (INDEPENDENT_AMBULATORY_CARE_PROVIDER_SITE_OTHER): Payer: Medicare Other

## 2022-06-12 ENCOUNTER — Encounter: Payer: Self-pay | Admitting: Emergency Medicine

## 2022-06-12 ENCOUNTER — Emergency Department (INDEPENDENT_AMBULATORY_CARE_PROVIDER_SITE_OTHER)
Admission: EM | Admit: 2022-06-12 | Discharge: 2022-06-12 | Disposition: A | Discharge status: Home / Self Care | Payer: Medicare Other | Source: Home / Self Care | Attending: Family Medicine | Admitting: Family Medicine

## 2022-06-12 DIAGNOSIS — R110 Nausea: Secondary | ICD-10-CM | POA: Diagnosis not present

## 2022-06-12 DIAGNOSIS — R1032 Left lower quadrant pain: Secondary | ICD-10-CM | POA: Diagnosis not present

## 2022-06-12 DIAGNOSIS — R197 Diarrhea, unspecified: Secondary | ICD-10-CM

## 2022-06-12 DIAGNOSIS — N200 Calculus of kidney: Secondary | ICD-10-CM | POA: Diagnosis not present

## 2022-06-12 DIAGNOSIS — R109 Unspecified abdominal pain: Secondary | ICD-10-CM

## 2022-06-12 DIAGNOSIS — N400 Enlarged prostate without lower urinary tract symptoms: Secondary | ICD-10-CM | POA: Diagnosis not present

## 2022-06-12 DIAGNOSIS — N2889 Other specified disorders of kidney and ureter: Secondary | ICD-10-CM | POA: Diagnosis not present

## 2022-06-12 LAB — POCT URINALYSIS DIP (MANUAL ENTRY)
Bilirubin, UA: NEGATIVE
Blood, UA: NEGATIVE
Glucose, UA: 250 mg/dL — AB
Ketones, POC UA: NEGATIVE mg/dL
Leukocytes, UA: NEGATIVE
Nitrite, UA: NEGATIVE
Protein Ur, POC: NEGATIVE mg/dL
Spec Grav, UA: 1.025 (ref 1.010–1.025)
Urobilinogen, UA: 0.2 E.U./dL
pH, UA: 5.5 (ref 5.0–8.0)

## 2022-06-12 LAB — I-STAT CREATININE (MANUAL ENTRY): Creatinine, Ser: 1 (ref 0.50–1.10)

## 2022-06-12 MED ORDER — IOHEXOL 9 MG/ML PO SOLN: 500.0000 mL | ORAL | Status: AC

## 2022-06-12 MED ORDER — IOHEXOL 300 MG/ML  SOLN
100.0000 mL | Freq: Once | INTRAMUSCULAR | Status: AC | PRN
  Administered 2022-06-12: 100 mL via INTRAVENOUS

## 2022-06-12 NOTE — ED Triage Notes (Signed)
Patient states that he was recently switched to Glipizide about one month ago.  Since making the switch he has been having increased diarrhea some nausea this morning.    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  Patient does have a lot going on personally so unsure if it's medication or nerves.  Patient is Type II diabetic.

## 2022-06-12 NOTE — ED Provider Notes (Signed)
[facility]    CSN: [id number] Arrival date & time: 06/12/22  0820      History   Chief Complaint Chief Complaint  Patient presents with   Diarrhea    HPI Murice Barbar is a 71 y.o. male.   HPI  Pleasant 71 year old gentleman.  Is followed by the primary care practice in this building, Dr. Dianah Field.  Has non-insulin-dependent diabetes.  Currently is taking metformin twice a day and was recently placed on glipizide.  He states he started the glipizide about a month ago.  Ever since taking the glipizide he states he has had an increase in bowel activities and diarrhea.  He states he has a formed stool every morning and then this is followed by several watery stools and diarrhea.  He has had to pull the truck off the road and have diarrhea while driving.  In addition he has developed left lower quadrant pain.  He states that this has been going on for about 2 weeks.  His wife states that he walks bent over holding his left lower abdomen.  Appetite has been decreased.  No nausea or vomiting.  No sweats chills or fever. Patient states he does have a history of colon cancer in his family.  He has had polyps in his colon on his previous colonoscopy.  His next colonoscopy is due in July 2023 Patient states he does not have any other bowel conditions such as irritable bowel syndrome or colitis  Past Medical History:  Diagnosis Date   Anterior pituitary adenoma syndrome ([place])    Bimalleolar fracture of right ankle    Cancer ([place]) 01/2022   Right neck melanoma   Colon polyp    Diabetes mellitus without complication (HCC)    Diabetic neuropathy (HCC)    Family history of cancer of GI tract    Family history of colon cancer    GERD (gastroesophageal reflux disease)    Rosanna Randy disease    Hyperlipidemia    Low back pain    Lumbar scoliosis    Prolactinoma ([place])    Sciatica of right side    Spondylosis of lumbar joint     Patient Active Problem List   Diagnosis  Date Noted   Hamstring injury, right, initial encounter 10/17/2021   Right foot pain 10/17/2021   Malignant melanoma ([place]) 10/17/2021   Bilateral shoulder pain 09/16/2018   Fracture of clavicle, left, closed 08/01/2018   Screening for colon cancer 12/10/2015   Fracture of ankle, bimalleolar, right, closed 11/22/2015   Pituitary adenoma ([place]) 08/17/2015   Hyperprolactinemia ([place]) 08/17/2015   Obesity 07/15/2015   Spondylosis of lumbar region without myelopathy or radiculopathy 07/15/2015   Primary osteoarthritis of both knees 07/15/2015   Right cervical radiculopathy 01/07/2015   HLD (hyperlipidemia) 01/05/2014   Diabetes mellitus type 2, controlled, without complications ([place]) 08/07/4817    Past Surgical History:  Procedure Laterality Date   COLONOSCOPY  2012   [place]   ESOPHAGOGASTRODUODENOSCOPY     With [facility] he had a bad case of reflux-said it been a few years. Maybe about 10 years or 11 years    [place] Right 03/08/2022   Procedure: REEXCISION RIGHT NECK MELANOMA;  Surgeon: [name], MD;  Location: [place];  Service: General;  Laterality: Right;   MELANOMA EXCISION Right 02/13/2022   Procedure: WIDE LOCAL EXCISION WITH ADVANCE FLAP CLOSURE FOR MELANOMA RIGHT NECK;  Surgeon: [name], MD;  Location: [place];  Service: General;  Laterality: Right;   ORIF ANKLE FRACTURE Right 11/24/2015   Procedure: OPEN REDUCTION INTERNAL FIXATION (ORIF) ANKLE FRACTURE;  Surgeon: Dorna Leitz, MD;  Location: [place];  Service: Orthopedics;  Laterality: Right;  Open reduction internal fixation right ankle fracture   POLYPECTOMY     TONSILLECTOMY         Home Medications    Prior to Admission medications   Medication Sig Start Date End Date Taking? Authorizing Provider  cabergoline (DOSTINEX) 0.5 MG tablet Take 1 tablet (0.5 mg total) by mouth 2 (two) times a week. Fridays & Mondays 06/08/22  Yes Silverio Decamp, MD  glipiZIDE (GLUCOTROL) 10 MG tablet Take 1 tablet (10 mg total) by mouth 2 (two) times daily before a meal. 01/27/22  Yes Silverio Decamp, MD  lisinopril (ZESTRIL) 2.5 MG tablet Take 1 tablet (2.5 mg total) by mouth daily. 09/07/21  Yes Emeterio Reeve, DO  metFORMIN (GLUCOPHAGE) 1000 MG tablet TAKE 1 TABLET (1,000 MG TOTAL) BY MOUTH 2 (TWO) TIMES DAILY WITH A MEAL. 07/18/21  Yes Emeterio Reeve, DO  Multiple Vitamins-Minerals (ADULT ONE DAILY GUMMIES PO) Take 2 tablets by mouth in the morning.   Yes [provider]  pioglitazone (ACTOS) 30 MG tablet Take 1 tablet (30 mg total) by mouth daily. 09/02/21  Yes Emeterio Reeve, DO  rosuvastatin (CRESTOR) 40 MG tablet TAKE 1 TABLET (40 MG TOTAL) BY MOUTH DAILY. NEED LABS 10/26/21  Yes Terrilyn Saver, NP  Wheat Dextrin (BENEFIBER PO) Take 1 Dose by mouth in the morning.   Yes [provider]    Family History Family History  Problem Relation Age of Onset   Heart failure Mother    Diabetes Mother    Hyperlipidemia Mother    Stroke Mother    Colon cancer Mother    Stroke Father    Hyperlipidemia Father    Diabetes Maternal Grandfather    Heart disease Maternal Grandfather    Stroke Maternal Grandfather    Diabetes Paternal Grandfather    Heart disease Paternal Grandfather    Stroke Paternal Grandfather    Esophageal cancer Neg Hx    Stomach cancer Neg Hx    Rectal cancer Neg Hx     Social History Social History   Tobacco Use   Smoking status: Former    Types: Cigars    Quit date: 03/2020    Years since quitting: 2.2    Passive exposure: Past   Smokeless tobacco: Never   Tobacco comments:    occassional cigar-every month stopped two years ago (03/2020)  Vaping Use   Vaping Use: Never used  Substance Use Topics   Alcohol use: Yes    Alcohol/week: 8.0 standard drinks of alcohol    Types: 8 Standard drinks or equivalent per week   Drug use: No     Allergies   Patient has no known  allergies.   Review of Systems Review of Systems See HPI  Physical Exam Triage Vital Signs ED Triage Vitals  Enc Vitals Group     BP 06/12/22 0841 (!) 143/83     Pulse Rate 06/12/22 0841 62     Resp 06/12/22 0841 18     Temp 06/12/22 0841 98 F (36.7 C)     Temp Source 06/12/22 0841 Oral     SpO2 06/12/22 0841 96 %     Weight 06/12/22 0842 258 lb (117 kg)     Height 06/12/22 0842 6' (1.829 m)  Head Circumference --      Peak Flow --      Pain Score 06/12/22 0841 6     Pain Loc --      Pain Edu? --      Excl. in [place]? --    No data found.  Updated Vital Signs BP 129/73 (BP Location: Left Arm)   Pulse 61   Temp 98.7 F (37.1 C) (Oral)   Resp 18   Ht 6' (1.829 m)   Wt 117 kg   SpO2 98%   BMI 34.99 kg/m      Physical Exam Constitutional:      General: He is not in acute distress.    Appearance: He is well-developed. He is obese.  HENT:     Head: Normocephalic and atraumatic.     Right Ear: Tympanic membrane and ear canal normal.     Left Ear: Tympanic membrane and ear canal normal.     Nose: Nose normal. No congestion.     Mouth/Throat:     Mouth: Mucous membranes are dry.     Comments: Geographic tongue Eyes:     Conjunctiva/sclera: Conjunctivae normal.     Pupils: Pupils are equal, round, and reactive to light.  Cardiovascular:     Rate and Rhythm: Normal rate and regular rhythm.     Heart sounds: Normal heart sounds.  Pulmonary:     Effort: Pulmonary effort is normal. No respiratory distress.     Breath sounds: Normal breath sounds.  Chest:     Chest wall: No tenderness.  Abdominal:     General: There is no distension.     Palpations: Abdomen is soft.     Tenderness: There is abdominal tenderness. There is no right CVA tenderness, left CVA tenderness, guarding or rebound.     Hernia: No hernia is present.     Comments: Tenderness to deep palpation of left lower quadrant with no guarding or rebound.  No palpable mass.  No inguinal hernia   Genitourinary:    Penis: Normal.   Musculoskeletal:        General: No swelling or tenderness. Normal range of motion.     Cervical back: Normal range of motion.  Lymphadenopathy:     Cervical: No cervical adenopathy.  Skin:    General: Skin is warm and dry.  Neurological:     General: No focal deficit present.     Mental Status: He is alert.  Psychiatric:        Mood and Affect: Mood normal.        Behavior: Behavior normal.      UC Treatments / Results  Labs (all labs ordered are listed, but only abnormal results are displayed) Labs Reviewed  POCT URINALYSIS DIP (MANUAL ENTRY) - Abnormal; Notable for the following components:      Result Value   Glucose, UA =250 (*)    All other components within normal limits  GASTROINTESTINAL PANEL BY PCR, STOOL (REPLACES STOOL CULTURE)  CBC WITH DIFFERENTIAL/PLATELET  COMPLETE METABOLIC PANEL WITH GFR  LIPASE    EKG   Radiology CT ABDOMEN PELVIS W CONTRAST  Result Date: 06/12/2022 CLINICAL DATA:  Abdominal pain. Increased diarrhea with some nausea. Also complaining of right groin pain. EXAM: CT ABDOMEN AND PELVIS WITH CONTRAST TECHNIQUE: Multidetector CT imaging of the abdomen and pelvis was performed using the standard protocol following bolus administration of intravenous contrast. RADIATION DOSE REDUCTION: This exam was performed according to the departmental dose-optimization program which includes  automated exposure control, adjustment of the mA and/or kV according to patient size and/or use of iterative reconstruction technique. CONTRAST:  174m OMNIPAQUE IOHEXOL 300 MG/ML  SOLN COMPARISON:  06/18/2017 FINDINGS: Lower chest: No acute abnormality. Hepatobiliary: No focal liver abnormality is seen. No gallstones, gallbladder wall thickening, or biliary dilatation. Pancreas: Unremarkable. No pancreatic ductal dilatation or surrounding inflammatory changes. Spleen: Normal in size without focal abnormality. Adrenals/Urinary Tract:  Normal adrenal glands. Kidneys are normal in size, orientation and position with symmetric enhancement and excretion. 9 mm low-attenuation lesion, midpole the right kidney and 7 mm low-attenuation lesion, mid to lower pole the left kidney, both consistent with cysts. Nonobstructing stones in the lower pole of the left kidney. No hydronephrosis. Ureters are normal in course and in caliber. No ureteral stone. Bladder is unremarkable. Stomach/Bowel: Normal stomach. Small bowel and colon are normal in caliber. No wall thickening. No inflammation. No evidence of appendicitis. Vascular/Lymphatic: Minimal aortic atherosclerotic calcifications. No aneurysm. No enlarged lymph nodes. Reproductive: Enlarged prostate measuring 5.7 x 5.1 x 5.4 cm. Prostate bulges against the posteroinferior bladder base. Prostate mildly increased in size since the prior CT. Other: No abdominal wall hernia.  No ascites. Musculoskeletal: No fracture or acute finding. No osteoblastic or osteolytic lesions. IMPRESSION: 1. No acute findings within the abdomen or pelvis. 2. No ureteral stone or obstructive uropathy. No evidence of bowel obstruction or inflammation. 3. Nonobstructing stones in the lower pole of the left kidney. 4. Prostate enlargement, increased in size from the prior CT. Electronically Signed   By: DLajean ManesM.D.   On: 06/12/2022 12:17    Procedures Procedures (including critical care time)  Medications Ordered in UC Medications - No data to display  Initial Impression / Assessment and Plan / UC Course  I have reviewed the triage vital signs and the nursing notes.  Pertinent labs & imaging results that were available during my care of the patient were reviewed by me and considered in my medical decision making (see chart for details).     Patient has left lower quadrant pain that is persisting over a couple of weeks and according to wife seeming worse.  Patient is uncomfortable.  Also has diarrhea.  He blames the  diarrhea on a new medicine glipizide.  He has had no fever or chills.  No nausea or vomiting.  No blood in his bowels.  He does have history of colon polyps. Because of concern for diverticulitis a CT scan is done.  It is negative.  Mild prostate enlargement Patient does not have any urinary symptoms, nocturia, and his urinalysis in the office is negative Blood work and stool studies are pending Case was discussed with Dr. TDianah Field patient's primary care doctor Home with conservative care.  Follow-up based on test results Final Clinical Impressions(s) / UC Diagnoses   Final diagnoses:  Abdominal pain, left lower quadrant  Diarrhea of presumed infectious origin     Discharge Instructions      Make sure you are drinking plenty of fluids Continue all of your current medications Consider adding Imodium if needed for severe diarrhea Lab test will be available on MyChart Call your PCP today to make an appointment for early next week     ED Prescriptions   None    PDMP not reviewed this encounter.   NRaylene Everts MD 06/12/22 1359

## 2022-06-12 NOTE — Discharge Instructions (Signed)
Make sure you are drinking plenty of fluids Continue all of your current medications Consider adding Imodium if needed for severe diarrhea Lab test will be available on MyChart Call your PCP today to make an appointment for early next week

## 2022-06-13 LAB — COMPLETE METABOLIC PANEL WITH GFR
AG Ratio: 1.9 (calc) (ref 1.0–2.5)
ALT: 25 U/L (ref 9–46)
AST: 20 U/L (ref 10–35)
Albumin: 4.1 g/dL (ref 3.6–5.1)
Alkaline phosphatase (APISO): 58 U/L (ref 35–144)
BUN: 18 mg/dL (ref 7–25)
CO2: 24 mmol/L (ref 20–32)
Calcium: 9.4 mg/dL (ref 8.6–10.3)
Chloride: 104 mmol/L (ref 98–110)
Creat: 1 mg/dL (ref 0.70–1.35)
Globulin: 2.2 g/dL (calc) (ref 1.9–3.7)
Glucose, Bld: 222 mg/dL — ABNORMAL HIGH (ref 65–99)
Potassium: 4.9 mmol/L (ref 3.5–5.3)
Sodium: 141 mmol/L (ref 135–146)
Total Bilirubin: 1.4 mg/dL — ABNORMAL HIGH (ref 0.2–1.2)
Total Protein: 6.3 g/dL (ref 6.1–8.1)
eGFR: 81 mL/min/{1.73_m2} (ref >=60)

## 2022-06-13 LAB — CBC WITH DIFFERENTIAL/PLATELET
Absolute Monocytes: 480 cells/uL (ref 200–950)
Basophils Absolute: 38 cells/uL (ref 0–200)
Basophils Relative: 0.6 %
Eosinophils Absolute: 128 cells/uL (ref 15–500)
Eosinophils Relative: 2 %
HCT: 45.6 % (ref 38.5–50.0)
Hemoglobin: 15.2 g/dL (ref 13.2–17.1)
Lymphs Abs: 1587 cells/uL (ref 850–3900)
MCH: 29.4 pg (ref 27.0–33.0)
MCHC: 33.3 g/dL (ref 32.0–36.0)
MCV: 88.2 fL (ref 80.0–100.0)
MPV: 10.4 fL (ref 7.5–12.5)
Monocytes Relative: 7.5 %
Neutro Abs: 4166 cells/uL (ref 1500–7800)
Neutrophils Relative %: 65.1 %
Platelets: 131 10*3/uL — ABNORMAL LOW (ref 140–400)
RBC: 5.17 10*6/uL (ref 4.20–5.80)
RDW: 13.2 % (ref 11.0–15.0)
Total Lymphocyte: 24.8 %
WBC: 6.4 10*3/uL (ref 3.8–10.8)

## 2022-06-13 LAB — TIQ-NTM

## 2022-06-13 LAB — LIPASE: Lipase: 181 U/L — ABNORMAL HIGH (ref 7–60)

## 2022-06-14 ENCOUNTER — Telehealth: Payer: Self-pay | Admitting: Emergency Medicine

## 2022-06-14 NOTE — Telephone Encounter (Signed)
Return call to [facility] regarding extra specimen sent - only test ordered in pt chart was for a GI Pathogen Panel- no test for parasites noted. No other questions at this time.

## 2022-06-15 ENCOUNTER — Encounter: Payer: Self-pay | Admitting: Gastroenterology

## 2022-06-15 LAB — GASTROINTESTINAL PATHOGEN PNL
CampyloBacter Group: NOT DETECTED
Norovirus GI/GII: NOT DETECTED
Rotavirus A: NOT DETECTED
Salmonella species: NOT DETECTED
Shiga Toxin 1: NOT DETECTED
Shiga Toxin 2: NOT DETECTED
Shigella Species: NOT DETECTED
Vibrio Group: NOT DETECTED
Yersinia enterocolitica: NOT DETECTED

## 2022-06-15 LAB — EXTRA SPECIMEN

## 2022-06-19 ENCOUNTER — Ambulatory Visit (INDEPENDENT_AMBULATORY_CARE_PROVIDER_SITE_OTHER): Payer: Medicare Other | Admitting: Sports Medicine

## 2022-06-19 ENCOUNTER — Encounter: Payer: Self-pay | Admitting: Sports Medicine

## 2022-06-19 DIAGNOSIS — E119 Type 2 diabetes mellitus without complications: Secondary | ICD-10-CM

## 2022-06-19 NOTE — Assessment & Plan Note (Signed)
 Pleasant 71 year old male, type 2 diabetes, he was switched to high-dose glipizide , metformin , Actos , A1c was 7.9% back last month. We did discuss that he should be working on an exercise prescription, he did cut his beers back from 12-6 beers total per week. Diabetic eye exam was back in March. He did have an episode of diarrhea, was seen in urgent care, CT abdomen and pelvis was unrevealing, GI pathogen panel unrevealing. He was taking his medication on empty stomach, switched to taking it with food and symptoms resolved. During the evaluation pancreatic enzymes were elevated into the mid 100s. Denied any abdominal pain but I did explain to him that sometimes pancreatic insufficiency can present with diarrhea. He will see me again in August at which point I think we should check his pancreatic enzymes again.

## 2022-06-19 NOTE — Progress Notes (Signed)
    Procedures performed today:    None.  Independent interpretation of notes and tests performed by another provider:   None.  Brief History, Exam, Impression, and Recommendations:    Diabetes mellitus type 2, controlled, without complications (HCC) Pleasant 71 year old male, type 2 diabetes, he was switched to high-dose glipizide , metformin , Actos , A1c was 7.9% back last month. We did discuss that he should be working on an exercise prescription, he did cut his beers back from 12-6 beers total per week. Diabetic eye exam was back in March. He did have an episode of diarrhea, was seen in urgent care, CT abdomen and pelvis was unrevealing, GI pathogen panel unrevealing. He was taking his medication on empty stomach, switched to taking it with food and symptoms resolved. During the evaluation pancreatic enzymes were elevated into the mid 100s. Denied any abdominal pain but I did explain to him that sometimes pancreatic insufficiency can present with diarrhea. He will see me again in August at which point I think we should check his pancreatic enzymes again.  I spent 30 minutes of total time managing this patient today, this includes chart review, face to face, and non-face to face time.  ___________________________________________ Debby PARAS. Curtis, M.D., ABFM., CAQSM. Primary Care and Sports Medicine [facility]  Adjunct Instructor of Family Medicine  [employer]

## 2022-06-29 ENCOUNTER — Ambulatory Visit (AMBULATORY_SURGERY_CENTER): Payer: Self-pay | Admitting: *Deleted

## 2022-06-29 VITALS — Ht 72.0 in | Wt 266.0 lb

## 2022-06-29 DIAGNOSIS — Z86010 Personal history of colonic polyps: Secondary | ICD-10-CM

## 2022-06-29 DIAGNOSIS — Z800 Family history of malignant neoplasm of digestive organs: Secondary | ICD-10-CM

## 2022-06-29 NOTE — Progress Notes (Signed)
No egg or soy allergy known to patient  No issues known to pt with past sedation with any surgeries or procedures Patient denies ever being told they had issues or difficulty with intubation  No FH of Malignant Hyperthermia Pt is not on diet pills Pt is not on  home 02  Pt is not on blood thinners  Pt denies issues with constipation  No A fib or A flutter Have any cardiac testing pending--PT DENIES     NO PA's for preps discussed with pt In PV today  Discussed with pt there will be an out-of-pocket cost for prep and that varies from $0 to 70 +  dollars - pt verbalized understanding  Pt instructed to use Single[employer].com or GoodRx for a price reduction on prep

## 2022-07-01 ENCOUNTER — Other Ambulatory Visit: Payer: Self-pay | Admitting: Osteopathic Medicine

## 2022-07-01 ENCOUNTER — Other Ambulatory Visit: Payer: Self-pay | Admitting: Family Medicine

## 2022-07-01 DIAGNOSIS — E119 Type 2 diabetes mellitus without complications: Secondary | ICD-10-CM

## 2022-07-27 ENCOUNTER — Encounter: Payer: Self-pay | Admitting: Gastroenterology

## 2022-07-27 ENCOUNTER — Ambulatory Visit (AMBULATORY_SURGERY_CENTER): Payer: Medicare Other | Admitting: Gastroenterology

## 2022-07-27 VITALS — BP 132/72 | HR 59 | Temp 97.5°F | Resp 11 | Ht 72.0 in | Wt 266.0 lb

## 2022-07-27 DIAGNOSIS — Z09 Encounter for follow-up examination after completed treatment for conditions other than malignant neoplasm: Secondary | ICD-10-CM | POA: Diagnosis not present

## 2022-07-27 DIAGNOSIS — Z86010 Personal history of colonic polyps: Secondary | ICD-10-CM

## 2022-07-27 DIAGNOSIS — D125 Benign neoplasm of sigmoid colon: Secondary | ICD-10-CM

## 2022-07-27 DIAGNOSIS — D122 Benign neoplasm of ascending colon: Secondary | ICD-10-CM

## 2022-07-27 DIAGNOSIS — K641 Second degree hemorrhoids: Secondary | ICD-10-CM | POA: Diagnosis not present

## 2022-07-27 DIAGNOSIS — K6389 Other specified diseases of intestine: Secondary | ICD-10-CM | POA: Diagnosis not present

## 2022-07-27 MED ORDER — SODIUM CHLORIDE 0.9 % IV SOLN: 500.0000 mL | Freq: Once | INTRAVENOUS | Status: DC

## 2022-07-27 NOTE — Progress Notes (Signed)
Pt's states no medical or surgical changes since previsit or office visit. 

## 2022-07-27 NOTE — Progress Notes (Signed)
Called to room to assist during endoscopic procedure.  Patient ID and intended procedure confirmed with present staff. Received instructions for my participation in the procedure from the performing physician.  

## 2022-07-27 NOTE — Progress Notes (Signed)
Sedate, gd SR, tolerated procedure well, VSS, report to RN 

## 2022-07-27 NOTE — Progress Notes (Signed)
GASTROENTEROLOGY PROCEDURE H&P NOTE   Primary Care Physician: Silverio Decamp, MD    Reason for Procedure:  Colon Polyp surveillance, Fhx of colon cancer  Plan:    Colonoscopy  Patient is appropriate for endoscopic procedure(s) in the ambulatory ([place]) setting.  The nature of the procedure, as well as the risks, benefits, and alternatives were carefully and thoroughly reviewed with the patient. Ample time for discussion and questions allowed. The patient understood, was satisfied, and agreed to proceed.     HPI: William Webster is a 71 y.o. male who presents for colonoscopy for continued colon polyp surveillance.  Endoscopic history: -Colonoscopy (10/2011, [facility]): No report for review.  Partial pathology report noting 2 specimens submitted, but unknown histologic result.  Recommended repeat in 5 years per patient -Colonoscopy (2007): Normal per patient, recommended repeat in 5 years -Colonoscopy (2006): ?  High risk polyp per patient -Colonoscopy (12/1999): No endoscopy report available for review, but path report notable for hyperplastic polyps - Colonoscopy (06/2019): 2 subcentimeter polyps (path: SSP x2), 12 mm flat ascending polyp (SSP), internal hemorrhoids.  Normal TI.  Repeat 3 years  Family history notable for mother with colon cancer, diagnosed late 71's or 69 yo.   Past Medical History:  Diagnosis Date   Anterior pituitary adenoma syndrome ([place])    Bimalleolar fracture of right ankle    Cancer ([place]) 01/2022   Right neck melanoma   Colon polyp    Diabetes mellitus without complication (HCC)    Diabetic neuropathy (HCC)    Family history of cancer of GI tract    Family history of colon cancer    GERD (gastroesophageal reflux disease)    Gilbert disease    Hyperlipidemia    Low back pain    Lumbar scoliosis    Prolactinoma ([place])    Sciatica of right side    Spondylosis of lumbar joint     Past Surgical History:  Procedure  Laterality Date   COLONOSCOPY  2012   [place]   ESOPHAGOGASTRODUODENOSCOPY     With [facility] he had a bad case of reflux-said it been a few years. Maybe about 10 years or 11 years    [place] Right 03/08/2022   Procedure: REEXCISION RIGHT NECK MELANOMA;  Surgeon: [name], MD;  Location: [place];  Service: General;  Laterality: Right;   MELANOMA EXCISION Right 02/13/2022   Procedure: WIDE LOCAL EXCISION WITH ADVANCE FLAP CLOSURE FOR MELANOMA RIGHT NECK;  Surgeon: [name], MD;  Location: [place];  Service: General;  Laterality: Right;   ORIF ANKLE FRACTURE Right 11/24/2015   Procedure: OPEN REDUCTION INTERNAL FIXATION (ORIF) ANKLE FRACTURE;  Surgeon: Dorna Leitz, MD;  Location: [place];  Service: Orthopedics;  Laterality: Right;  Open reduction internal fixation right ankle fracture   POLYPECTOMY     TONSILLECTOMY     UPPER GASTROINTESTINAL ENDOSCOPY      Prior to Admission medications   Medication Sig Start Date End Date Taking? Authorizing Provider  metFORMIN (GLUCOPHAGE) 1000 MG tablet TAKE 1 TABLET (1,000 MG TOTAL) BY MOUTH 2 (TWO) TIMES DAILY WITH A MEAL. 07/03/22   Silverio Decamp, MD  cabergoline (DOSTINEX) 0.5 MG tablet Take 1 tablet (0.5 mg total) by mouth 2 (two) times a week. Fridays & Mondays 06/08/22   Silverio Decamp, MD  glipiZIDE (GLUCOTROL) 10 MG tablet Take 1 tablet (10 mg total) by mouth 2 (two) times daily before a  meal. 01/27/22   Silverio Decamp, MD  lisinopril (ZESTRIL) 2.5 MG tablet Take 1 tablet (2.5 mg total) by mouth daily. 09/07/21   Emeterio Reeve, DO  Multiple Vitamins-Minerals (ADULT ONE DAILY GUMMIES PO) Take 2 tablets by mouth in the morning.    [provider]  pioglitazone (ACTOS) 30 MG tablet Take 1 tablet (30 mg total) by mouth daily. 09/02/21   Emeterio Reeve, DO  rosuvastatin (CRESTOR) 40 MG tablet TAKE 1 TABLET (40 MG TOTAL) BY MOUTH DAILY. NEED  LABS 10/26/21   Terrilyn Saver, NP  Wheat Dextrin (BENEFIBER PO) Take 1 Dose by mouth in the morning.    [provider]    Current Outpatient Medications  Medication Sig Dispense Refill   metFORMIN (GLUCOPHAGE) 1000 MG tablet TAKE 1 TABLET (1,000 MG TOTAL) BY MOUTH 2 (TWO) TIMES DAILY WITH A MEAL. 180 tablet 2   cabergoline (DOSTINEX) 0.5 MG tablet Take 1 tablet (0.5 mg total) by mouth 2 (two) times a week. Fridays & Mondays 30 tablet 1   glipiZIDE (GLUCOTROL) 10 MG tablet Take 1 tablet (10 mg total) by mouth 2 (two) times daily before a meal. 180 tablet 3   lisinopril (ZESTRIL) 2.5 MG tablet Take 1 tablet (2.5 mg total) by mouth daily. 90 tablet 3   Multiple Vitamins-Minerals (ADULT ONE DAILY GUMMIES PO) Take 2 tablets by mouth in the morning.     pioglitazone (ACTOS) 30 MG tablet Take 1 tablet (30 mg total) by mouth daily. 90 tablet 3   rosuvastatin (CRESTOR) 40 MG tablet TAKE 1 TABLET (40 MG TOTAL) BY MOUTH DAILY. NEED LABS 90 tablet 1   Wheat Dextrin (BENEFIBER PO) Take 1 Dose by mouth in the morning.     Current Facility-Administered Medications  Medication Dose Route Frequency Provider Last Rate Last Admin   0.9 %  sodium chloride infusion  500 mL Intravenous Once Jakim Drapeau V, DO        Allergies as of 07/27/2022   (No Known Allergies)    Family History  Problem Relation Age of Onset   Colon polyps Mother    Heart failure Mother    Diabetes Mother    Hyperlipidemia Mother    Stroke Mother    Colon cancer Mother        EARLY 60'S   Stroke Father    Hyperlipidemia Father    Diabetes Maternal Grandfather    Heart disease Maternal Grandfather    Stroke Maternal Grandfather    Diabetes Paternal Grandfather    Heart disease Paternal Grandfather    Stroke Paternal Grandfather    Esophageal cancer Neg Hx    Stomach cancer Neg Hx    Rectal cancer Neg Hx     Social History   Socioeconomic History   Marital status: Married    Spouse name: [name]   Number  of children: 2   Years of education: 12th grade   Highest education level: 12th grade  Occupational History    Comment: Part-time  Tobacco Use   Smoking status: Former    Types: Cigars    Quit date: 03/2020    Years since quitting: 2.3    Passive exposure: Past   Smokeless tobacco: Never   Tobacco comments:    occassional cigar-every month stopped two years ago (03/2020)  Vaping Use   Vaping Use: Never used  Substance and Sexual Activity   Alcohol use: Yes    Alcohol/week: 8.0 standard drinks of alcohol    Types: 8  Standard drinks or equivalent per week   Drug use: No   Sexual activity: Yes    Birth control/protection: None  Other Topics Concern   Not on file  Social History Narrative   Works part-time ([employer]). Lives with wife. He has two daughters (one lives in [place] and one in [place]). Enjoys going to the car shows.    Social Determinants of Health   Financial Resource Strain: Low Risk  (03/27/2022)   Overall Financial Resource Strain (CARDIA)    Difficulty of Paying Living Expenses: Not hard at all  Food Insecurity: No Food Insecurity (03/27/2022)   Hunger Vital Sign    Worried About Running Out of Food in the Last Year: Never true    Ran Out of Food in the Last Year: Never true  Transportation Needs: No Transportation Needs (03/27/2022)   PRAPARE - Hydrologist (Medical): No    Lack of Transportation (Non-Medical): No  Physical Activity: Inactive (03/27/2022)   Exercise Vital Sign    Days of Exercise per Week: 0 days    Minutes of Exercise per Session: 0 min  Stress: No Stress Concern Present (03/27/2022)   [place]    Feeling of Stress : Not at all  Social Connections: Moderately Integrated (03/27/2022)   Social Connection and Isolation Panel [NHANES]    Frequency of Communication with Friends and Family: More than three times a week    Frequency of Social Gatherings with  Friends and Family: Once a week    Attends Religious Services: 1 to 4 times per year    Active Member of [employer] or Organizations: No    Attends Archivist Meetings: Never    Marital Status: Married  Human resources officer Violence: Not At Risk (03/27/2022)   Humiliation, Afraid, Rape, and Kick questionnaire    Fear of Current or Ex-Partner: No    Emotionally Abused: No    Physically Abused: No    Sexually Abused: No    Physical Exam: Vital signs in last 24 hours: '@BP'$  130/87   Pulse 63   Temp (!) 97.5 F (36.4 C)   Ht 6' (1.829 m)   Wt 266 lb (120.7 kg)   SpO2 97%   BMI 36.08 kg/m  GEN: NAD EYE: Sclerae anicteric ENT: MMM CV: Non-tachycardic Pulm: CTA b/l GI: Soft, NT/ND NEURO:  Alert & Oriented x 3   Gerrit Heck, DO [facility]   07/27/2022 7:54 AM

## 2022-07-27 NOTE — Op Note (Signed)
[place] Patient Name: William Webster Procedure Date: 07/27/2022 8:25 AM MRN: [id number] Endoscopist: Gerrit Heck , MD Age: 71 Referring MD:  Date of Birth: 08-Mar-1951 Gender: Male Account #: [id number] Procedure:                Colonoscopy Indications:              High risk colon cancer surveillance: Personal                            history of sessile serrated colon polyp (10 mm or                            greater in size)                           Last Colonoscopy was in 06/2019 and notable for 2                            subcentimeter polyps (path: SSP x2), 12 mm flat                            ascending polyp (SSP), internal hemorrhoids. Normal                            TI. Repeat 3 years Medicines:                Monitored Anesthesia Care Procedure:                Pre-Anesthesia Assessment:                           - Prior to the procedure, a History and Physical                            was performed, and patient medications and                            allergies were reviewed. The patient's tolerance of                            previous anesthesia was also reviewed. The risks                            and benefits of the procedure and the sedation                            options and risks were discussed with the patient.                            All questions were answered, and informed consent                            was obtained. Prior Anticoagulants: The patient has  taken no previous anticoagulant or antiplatelet                            agents. ASA Grade Assessment: II - A patient with                            mild systemic disease. After reviewing the risks                            and benefits, the patient was deemed in                            satisfactory condition to undergo the procedure.                           After obtaining informed consent, the colonoscope                             was passed under direct vision. Throughout the                            procedure, the patient's blood pressure, pulse, and                            oxygen saturations were monitored continuously. The                            Colonoscope was introduced through the anus and                            advanced to the the terminal ileum. The colonoscopy                            was performed without difficulty. The patient                            tolerated the procedure well. The quality of the                            bowel preparation was good. The terminal ileum,                            ileocecal valve, appendiceal orifice, and rectum                            were photographed. Scope In: 8:38:08 AM Scope Out: 8:59:28 AM Scope Withdrawal Time: 0 hours 17 minutes 33 seconds  Total Procedure Duration: 0 hours 21 minutes 20 seconds  Findings:                 Hemorrhoids were found on perianal exam.                           Two sessile polyps were found in the ascending  colon. The polyps were 3 to 4 mm in size. These                            polyps were removed with a cold snare. Resection                            and retrieval were complete. Estimated blood loss                            was minimal.                           A 3 mm polyp was found in the sigmoid colon. The                            polyp was sessile. The polyp was removed with a                            cold snare. Resection and retrieval were complete.                            Estimated blood loss was minimal.                           Non-bleeding internal hemorrhoids were found during                            retroflexion. The hemorrhoids were small and Grade                            II (internal hemorrhoids that prolapse but reduce                            spontaneously).                           The terminal ileum appeared normal. Complications:             No immediate complications. Estimated Blood Loss:     Estimated blood loss was minimal. Impression:               - Hemorrhoids found on perianal exam.                           - Two 3 to 4 mm polyps in the ascending colon,                            removed with a cold snare. Resected and retrieved.                           - One 3 mm polyp in the sigmoid colon, removed with                            a cold snare. Resected and retrieved.                           -  Non-bleeding internal hemorrhoids.                           - The examined portion of the ileum was normal. Recommendation:           - Patient has a contact number available for                            emergencies. The signs and symptoms of potential                            delayed complications were discussed with the                            patient. Return to normal activities tomorrow.                            Written discharge instructions were provided to the                            patient.                           - Resume previous diet.                           - Continue present medications.                           - Await pathology results.                           - Repeat colonoscopy in 5 years for surveillance,                            or potentially sooner based on pathology results.                           - Return to GI office PRN.                           - Use fiber, for example Citrucel, Fibercon, Konsyl                            or Metamucil. Gerrit Heck, MD 07/27/2022 9:04:57 AM

## 2022-07-27 NOTE — Patient Instructions (Signed)
Discharge instructions given. Handouts on polyps and hemorrhoids. Resume previous medications. YOU HAD AN ENDOSCOPIC PROCEDURE TODAY AT [facility]:   Refer to the procedure report that was given to you for any specific questions about what was found during the examination.  If the procedure report does not answer your questions, please call your gastroenterologist to clarify.  If you requested that your care partner not be given the details of your procedure findings, then the procedure report has been included in a sealed envelope for you to review at your convenience later.  YOU SHOULD EXPECT: Some feelings of bloating in the abdomen. Passage of more gas than usual.  Walking can help get rid of the air that was put into your GI tract during the procedure and reduce the bloating. If you had a lower endoscopy (such as a colonoscopy or flexible sigmoidoscopy) you may notice spotting of blood in your stool or on the toilet paper. If you underwent a bowel prep for your procedure, you may not have a normal bowel movement for a few days.  Please Note:  You might notice some irritation and congestion in your nose or some drainage.  This is from the oxygen used during your procedure.  There is no need for concern and it should clear up in a day or so.  SYMPTOMS TO REPORT IMMEDIATELY:  Following lower endoscopy (colonoscopy or flexible sigmoidoscopy):  Excessive amounts of blood in the stool  Significant tenderness or worsening of abdominal pains  Swelling of the abdomen that is new, acute  Fever of 100F or higher   For urgent or emergent issues, a gastroenterologist can be reached at any hour by calling [phone]. Do not use MyChart messaging for urgent concerns.    DIET:  We do recommend a small meal at first, but then you may proceed to your regular diet.  Drink plenty of fluids but you should avoid alcoholic beverages for 24 hours.  ACTIVITY:  You should plan to take it  easy for the rest of today and you should NOT DRIVE or use heavy machinery until tomorrow (because of the sedation medicines used during the test).    FOLLOW UP: Our staff will call the number listed on your records the next business day following your procedure.  We will call around 7:15- 8:00 am to check on you and address any questions or concerns that you may have regarding the information given to you following your procedure. If we do not reach you, we will leave a message.  If you develop any symptoms (ie: fever, flu-like symptoms, shortness of breath, cough etc.) before then, please call [phone].  If you test positive for Covid 19 in the 2 weeks post procedure, please call and report this information to [place].    If any biopsies were taken you will be contacted by phone or by letter within the next 1-3 weeks.  Please call us at [phone] if you have not heard about the biopsies in 3 weeks.    SIGNATURES/CONFIDENTIALITY: You and/or your care partner have signed paperwork which will be entered into your electronic medical record.  These signatures attest to the fact that that the information above on your After Visit Summary has been reviewed and is understood.  Full responsibility of the confidentiality of this discharge information lies with you and/or your care-partner.

## 2022-07-28 ENCOUNTER — Telehealth: Payer: Self-pay

## 2022-07-28 NOTE — Telephone Encounter (Signed)
  Follow up Call-     07/27/2022    7:45 AM  Call back number  Post procedure Call Back phone  # [phone]  Permission to leave phone message Yes     Patient questions:  Do you have a fever, pain , or abdominal swelling? Yes.   Pain Score  0 *  Have you tolerated food without any problems? Yes.    Have you been able to return to your normal activities? Yes.    Do you have any questions about your discharge instructions: Diet   No. Medications  No. Follow up visit  No.  Do you have questions or concerns about your Care? No.  Actions: * If pain score is 4 or above: No action needed, pain <4.

## 2022-07-28 NOTE — Telephone Encounter (Signed)
No problem, happy to help out.  Mickel Baas, can you please reach out to her to assist scheduling.  Please try to obtain prior records for review and we can determine whether or not direct access vs OV is most appropriate for her.  Thanks.

## 2022-07-28 NOTE — Telephone Encounter (Signed)
Called patient for post-procedure f/u call. He is doing well (please see note), however patient is concerned because his wife has been attempting to schedule her own colonoscopy, he says hers was over 10 years ago at a different practice, she has been calling our office, leaving messages and states that no one is returning her calls. I explained that she would be considered a new patient and the practice is currently backed up with getting new patients in but that I would send you a note so that possibly your office nurse could expedite this. Thank you.

## 2022-07-31 NOTE — Telephone Encounter (Signed)
Spoke with pt's wife. See documentation in wife's chart.

## 2022-08-03 ENCOUNTER — Ambulatory Visit (INDEPENDENT_AMBULATORY_CARE_PROVIDER_SITE_OTHER): Payer: Medicare Other | Admitting: Sports Medicine

## 2022-08-03 ENCOUNTER — Other Ambulatory Visit: Payer: Self-pay | Admitting: Sports Medicine

## 2022-08-03 VITALS — BP 130/81 | HR 59 | Temp 97.4°F | Ht 72.0 in | Wt 265.0 lb

## 2022-08-03 DIAGNOSIS — N139 Obstructive and reflux uropathy, unspecified: Secondary | ICD-10-CM

## 2022-08-03 DIAGNOSIS — E119 Type 2 diabetes mellitus without complications: Secondary | ICD-10-CM

## 2022-08-03 LAB — POCT GLYCOSYLATED HEMOGLOBIN (HGB A1C): HbA1c POC (<> result, manual entry): 8.8 % (ref 4.0–5.6)

## 2022-08-03 MED ORDER — TRULICITY 0.75 MG/0.5ML ~~LOC~~ SOAJ: 0.7500 mg | SUBCUTANEOUS | 11 refills | Status: DC

## 2022-08-03 NOTE — Addendum Note (Signed)
Addended by: Silverio Decamp on: 08/03/2022 09:03 AM   Modules accepted: Level of Service

## 2022-08-03 NOTE — Progress Notes (Addendum)
    Procedures performed today:    None.  Independent interpretation of notes and tests performed by another provider:   None.  Brief History, Exam, Impression, and Recommendations:    Diabetes mellitus type 2, controlled, without complications (HCC) William Webster returns, he is a pleasant 71 year old male, type 2 diabetes high-dose glipizide, metformin, Actos, last A1c 7.9%, unfortunately he has gained some weight, has had some life stressors, A1c worsened to 8.8%. He understands that his morbid obesity is significantly contributing to his lack of diabetes control, at this point we will add Trulicity with plans for an up taper. I have also given him an exercise prescription. Today we will go ahead and check his pancreatic enzymes as they were elevated into the mid 100s at the last visit.  I spent 40 minutes of total time managing this patient today, this includes chart review, face to face, and non-face to face time.  ____________________________________________ Gwen Her. Dianah Field, M.D., ABFM., CAQSM., AME. Primary Care and Sports Medicine [facility]  Adjunct Professor of [place] [employer]  Risk manager

## 2022-08-03 NOTE — Assessment & Plan Note (Signed)
William Webster returns, he is a pleasant 71 year old male, type 2 diabetes high-dose glipizide, metformin, Actos, last A1c 7.9%, unfortunately he has gained some weight, has had some life stressors, A1c worsened to 8.8%. He understands that his morbid obesity is significantly contributing to his lack of diabetes control, at this point we will add Trulicity with plans for an up taper. I have also given him an exercise prescription. Today we will go ahead and check his pancreatic enzymes as they were elevated into the mid 100s at the last visit.

## 2022-08-04 ENCOUNTER — Encounter: Payer: Self-pay | Admitting: Sports Medicine

## 2022-08-06 ENCOUNTER — Encounter: Payer: Self-pay | Admitting: Gastroenterology

## 2022-08-07 LAB — TSH: TSH: 0.73 mIU/L (ref 0.40–4.50)

## 2022-08-07 LAB — LIPID PANEL
Cholesterol: 139 mg/dL (ref <200)
HDL: 55 mg/dL (ref >=40)
LDL Cholesterol (Calc): 58 mg/dL (calc)
Non-HDL Cholesterol (Calc): 84 mg/dL (calc) (ref <130)
Total CHOL/HDL Ratio: 2.5 (calc) (ref <5.0)
Triglycerides: 188 mg/dL — ABNORMAL HIGH (ref <150)

## 2022-08-07 LAB — PSA, TOTAL AND FREE
PSA, % Free: 33 % (calc) (ref >25)
PSA, Free: 0.8 ng/mL
PSA, Total: 2.4 ng/mL (ref <=4.0)

## 2022-08-07 LAB — LIPASE: Lipase: 92 U/L — ABNORMAL HIGH (ref 7–60)

## 2022-08-22 ENCOUNTER — Other Ambulatory Visit: Payer: Self-pay | Admitting: Osteopathic Medicine

## 2022-08-25 ENCOUNTER — Other Ambulatory Visit: Payer: Self-pay

## 2022-08-25 MED ORDER — PIOGLITAZONE HCL 30 MG PO TABS
30.0000 mg | ORAL_TABLET | Freq: Every day | ORAL | 3 refills | Status: DC
Start: 2022-08-25 — End: 2022-11-03

## 2022-10-07 DIAGNOSIS — Z23 Encounter for immunization: Secondary | ICD-10-CM | POA: Diagnosis not present

## 2022-10-17 ENCOUNTER — Telehealth: Payer: Self-pay | Admitting: *Deleted

## 2022-10-17 ENCOUNTER — Encounter: Payer: Self-pay | Admitting: *Deleted

## 2022-10-17 NOTE — Patient Instructions (Signed)
Visit Information  Thank you for taking time to visit with me today. Please don't hesitate to contact me if I can be of assistance to you.  As you requested, I have made Dr. Boone Master aware that you are out of lisinopril and need a new prescription called in to your outpatient pharmacy  Following are the goals we discussed today:   Goals Addressed             This Visit's Progress    COMPLETED: Care Coordination Activities: No follow up required   On track    Care Coordination Interventions: Evaluation of current treatment plan related to DMII and patient's adherence to plan as established by provider Advised patient to provide appropriate vaccination information to provider or CM team member at next visit Provided education to patient re: benefits of exercise, weight loss in setting of DMII- patient reports recent weight loss of 15 pounds and confirms he is currently taking Trulicity; does not monitor blood sugars at home- currently not interested in starting home monitoring; wishes to see what updated A1-C value is when he attends PCP office visit on 11/03/22 Collaborated with PCP regarding patient's stated need for new RX for lisinopril-- patient states he contacted office to request new Rx, has not heard back- has not been taking x 3 weeks Reviewed scheduled/upcoming provider appointments including 11/03/22-- PCP follow up office visit Advised patient to discuss more affordable options for DMII (than Trulcitity-- patient states did not qualify for patient assistance) with provider Assessed social determinant of health barriers Confirmed patient attended Medicare Annual Wellness Visit 03/27/22; confirmed he recently obtained flu and COVID booster vaccines "last Saturday at [facility]"          If you are experiencing a Mental Health or [place] or need someone to talk to, please  call the Suicide and Crisis Lifeline: 988 call the [place] National Suicide Prevention Lifeline:  332-234-1127 or TTY: 571-181-1143 TTY 434 090 8251) to talk to a trained counselor call 1-800-273-TALK (toll free, 24 hour hotline) go to [facility] [address] [place] [phone]) call the [place]: [phone] call 911   Patient verbalizes understanding of instructions and care plan provided today and agrees to view in [place]. Active MyChart status and patient understanding of how to access instructions and care plan via MyChart confirmed with patient.     No further follow up required: no ongoing care coordination needs identified   [name], RN, BSN, CCRN Alumnus RN CM Care Coordination/ Transition of [place] Management [phone]: direct office

## 2022-10-17 NOTE — Patient Outreach (Signed)
  Care Coordination   Initial Visit Note   10/17/2022 Name: William Webster MRN: [id number] DOB: 07/27/1951  William Webster is a 71 y.o. year old male who sees Thekkekandam, Gwen Her, MD for primary care. I spoke with  William Webster by phone today.  What matters to the patients health and wellness today?  "I am doing just fine, I have lost about 15 pounds and we just moved and where I am living now I'll be able to get more exercise like mowing and riding my bike-- I feel like I am on the right track with the blood sugar, I am taking the medications he has told me to even though they are very expensive.  We did not qualify for patient assistance.  I got my flu vaccine and the COVID booster last weekend.  The only thing I need help with is getting a new prescription for my lisinopril called [facility] to [facility] on [address]-- I have been out for almost 3 weeks ago; I called and left a message last week on the triage nurse phone number, and haven't heard a word back yet"  Interventions provided, including care coordination outreach to PCP completed to facilitate new Rx for lisinopril, as requested by patient today; no further care coordination needs identified today   Goals Addressed             This Visit's Progress    COMPLETED: Care Coordination Activities: No follow up required   On track    Care Coordination Interventions: Evaluation of current treatment plan related to DMII and patient's adherence to plan as established by provider Advised patient to provide appropriate vaccination information to provider or CM team member at next visit Provided education to patient re: benefits of exercise, weight loss in setting of DMII- patient reports recent weight loss of 15 pounds and confirms he is currently taking Trulicity; does not monitor blood sugars at home- currently not interested in starting home monitoring; wishes to see what updated A1-C value is when he attends PCP office  visit on 11/03/22 Collaborated with PCP regarding patient's stated need for new RX for lisinopril-- patient states he contacted office to request new Rx, has not heard back- has not been taking x 3 weeks Reviewed scheduled/upcoming provider appointments including 11/03/22-- PCP follow up office visit Advised patient to discuss more affordable options for DMII (than Trulcitity-- patient states did not qualify for patient assistance) with provider Assessed social determinant of health barriers Confirmed patient attended Medicare Annual Wellness Visit 03/27/22; confirmed he recently obtained flu and COVID booster vaccines "[facility]"          SDOH assessments and interventions completed:  Yes  SDOH Interventions Today    Flowsheet Row Most Recent Value  SDOH Interventions   Food Insecurity Interventions Intervention Not Indicated  Transportation Interventions Intervention Not Indicated  [drives self]       Care Coordination Interventions Activated:  Yes  Care Coordination Interventions:  Yes, provided   Follow up plan: No further intervention required.   Encounter Outcome:  Pt. Visit Completed   [name], RN, BSN, CCRN Alumnus RN CM Care Coordination/ Transition of [place] Management [phone]: direct office

## 2022-10-18 ENCOUNTER — Other Ambulatory Visit: Payer: Self-pay | Admitting: Sports Medicine

## 2022-10-18 MED ORDER — LISINOPRIL 2.5 MG PO TABS: 2.5000 mg | ORAL_TABLET | Freq: Every day | ORAL | 3 refills | Status: DC

## 2022-10-19 ENCOUNTER — Other Ambulatory Visit: Payer: Self-pay | Admitting: Family Medicine

## 2022-10-19 DIAGNOSIS — E119 Type 2 diabetes mellitus without complications: Secondary | ICD-10-CM

## 2022-11-03 ENCOUNTER — Encounter: Payer: Self-pay | Admitting: Sports Medicine

## 2022-11-03 ENCOUNTER — Other Ambulatory Visit: Payer: Self-pay | Admitting: Sports Medicine

## 2022-11-03 ENCOUNTER — Ambulatory Visit (INDEPENDENT_AMBULATORY_CARE_PROVIDER_SITE_OTHER): Payer: Medicare Other | Admitting: Sports Medicine

## 2022-11-03 VITALS — BP 118/74 | HR 55 | Wt 255.0 lb

## 2022-11-03 DIAGNOSIS — E119 Type 2 diabetes mellitus without complications: Secondary | ICD-10-CM

## 2022-11-03 LAB — POCT GLYCOSYLATED HEMOGLOBIN (HGB A1C): Hemoglobin A1C: 7.2 % — AB (ref 4.0–5.6)

## 2022-11-03 MED ORDER — TRULICITY 1.5 MG/0.5ML ~~LOC~~ SOAJ: 1.5000 mg | SUBCUTANEOUS | 11 refills | Status: DC

## 2022-11-03 MED ORDER — ROSUVASTATIN CALCIUM 40 MG PO TABS: 40.0000 mg | ORAL_TABLET | Freq: Every day | ORAL | 3 refills | Status: DC

## 2022-11-03 NOTE — Assessment & Plan Note (Signed)
William Webster returns, he is a very pleasant 71 year old male, diabetes mellitus type 2, historically adequately controlled, last A1c 2.4%, we added Trulicity 4.62 mg, M6N today is 7.2% and he has lost a great deal of weight. We will go ahead and discontinue his Actos and glipizide, and bump Trulicity up to 1.5 mg with a 91-monthA1c follow-up. The plan is to continue his up titration to max tolerable dose. Return in 3 months for repeat A1c.

## 2022-11-03 NOTE — Progress Notes (Signed)
    Procedures performed today:    None.  Independent interpretation of notes and tests performed by another provider:   None.  Brief History, Exam, Impression, and Recommendations:    Diabetes mellitus type 2, controlled, without complications (HCC) William Webster returns, he is a very pleasant 71 year old male, diabetes mellitus type 2, historically adequately controlled, last A1c 9.1%, we added Trulicity 6.94 mg, H0T today is 7.2% and he has lost a great deal of weight. We will go ahead and discontinue his Actos and glipizide, and bump Trulicity up to 1.5 mg with a 90-monthA1c follow-up. The plan is to continue his up titration to max tolerable dose. Return in 3 months for repeat A1c.  Chronic process not at goal with pharmacologic intervention.  ____________________________________________ TGwen Her TDianah Field M.D., ABFM., CAQSM., AME. Primary Care and Sports Medicine [facility] Adjunct Professor of [employer] [facility]  FRisk manager

## 2022-11-27 ENCOUNTER — Other Ambulatory Visit: Payer: Self-pay | Admitting: Sports Medicine

## 2022-11-27 DIAGNOSIS — E221 Hyperprolactinemia: Secondary | ICD-10-CM

## 2022-12-01 ENCOUNTER — Other Ambulatory Visit: Payer: Self-pay

## 2023-02-08 ENCOUNTER — Ambulatory Visit (INDEPENDENT_AMBULATORY_CARE_PROVIDER_SITE_OTHER): Payer: Medicare Other

## 2023-02-08 ENCOUNTER — Ambulatory Visit (INDEPENDENT_AMBULATORY_CARE_PROVIDER_SITE_OTHER): Payer: Medicare Other | Admitting: Sports Medicine

## 2023-02-08 ENCOUNTER — Encounter: Payer: Self-pay | Admitting: Sports Medicine

## 2023-02-08 VITALS — BP 135/77 | HR 92

## 2023-02-08 DIAGNOSIS — M19042 Primary osteoarthritis, left hand: Secondary | ICD-10-CM | POA: Diagnosis not present

## 2023-02-08 DIAGNOSIS — M1812 Unilateral primary osteoarthritis of first carpometacarpal joint, left hand: Secondary | ICD-10-CM

## 2023-02-08 DIAGNOSIS — M6788 Other specified disorders of synovium and tendon, other site: Secondary | ICD-10-CM | POA: Diagnosis not present

## 2023-02-08 DIAGNOSIS — E119 Type 2 diabetes mellitus without complications: Secondary | ICD-10-CM | POA: Diagnosis not present

## 2023-02-08 MED ORDER — NITROGLYCERIN 0.2 MG/HR TD PT24: MEDICATED_PATCH | TRANSDERMAL | 11 refills | Status: DC

## 2023-02-08 MED ORDER — ACETAMINOPHEN ER 650 MG PO TBCR: 650.0000 mg | EXTENDED_RELEASE_TABLET | Freq: Three times a day (TID) | ORAL | 3 refills | Status: AC | PRN

## 2023-02-08 MED ORDER — TRULICITY 3 MG/0.5ML ~~LOC~~ SOAJ: 3.0000 mg | SUBCUTANEOUS | 11 refills | Status: DC

## 2023-02-08 NOTE — Assessment & Plan Note (Signed)
Increasing pain left first CMC, adding x-rays, he will increase Tylenol to arthritis strength. Home conditioning given, return to see me in 4 to 6 weeks, we can do an injection if not better.

## 2023-02-08 NOTE — Patient Instructions (Signed)
Achilles rehab: Begin with easy walking, heel, toe and backwards  Do calf raises on a step:  First lower and then raise on 1 foot  If this is painful, lower on 1 foot, do the heel raise on both feet Begin with 3 sets of 10 repetitions  Increase by 5 repetitions every 3 days  Goal is 3 sets of 30 repetitions  Do with both straight knee and knee at 20 degrees of flexion  If pain persists, once you can do 3 sets of 30 without weight, add backpack with 5 lbs.  Increase by 5 lbs per week to max of 30 lbs for 3 sets of 15

## 2023-02-08 NOTE — Addendum Note (Signed)
Addended by: Silverio Decamp on: 02/08/2023 04:47 PM   Modules accepted: Orders

## 2023-02-08 NOTE — Assessment & Plan Note (Signed)
Painful nodule left Achilles, negative Thompson's test. Adding topical nitroglycerin and eccentric conditioning, return in 6 to 8 weeks for this.

## 2023-02-08 NOTE — Progress Notes (Addendum)
    Procedures performed today:    None.  Independent interpretation of notes and tests performed by another provider:   None.  Brief History, Exam, Impression, and Recommendations:    Diabetes mellitus type 2, controlled, without complications ([place]) This is a very pleasant 72 year old male, we have been treating him longitudinally for diabetes, A1c at the last visit was 7.2%, in the spirit of improving his A1c control we discontinued Actos and glipizide, we bumped his Trulicity up to 1.5, unfortunately his hemoglobin A1c has worsened to 10.5%. I would like to go ahead and check a confirmatory A1c with the lab. If still elevated we will increase Trulicity to 3 mg. He declines nausea medicine. If still elevated after 3 months on 3 mg we will go up to 4.5. He is spending a good deal of money, approximate $250 a month on his Trulicity, he will call his insurance company and find if there is a more preferred agent.  Update: Patient is requesting that we just send in the 3 mg Trulicity for now.  Primary osteoarthritis of first carpometacarpal joint of one hand, left Increasing pain left first CMC, adding x-rays, he will increase Tylenol to arthritis strength. Home conditioning given, return to see me in 4 to 6 weeks, we can do an injection if not better.  Achilles tendinosis of left lower extremity Painful nodule left Achilles, negative Thompson's test. Adding topical nitroglycerin and eccentric conditioning, return in 6 to 8 weeks for this.    ____________________________________________ Gwen Her. Dianah Field, M.D., ABFM., CAQSM., AME. Primary Care and Sports Medicine [facility]  Adjunct Professor of [place] [employer] [facility] of Medicine  Risk manager

## 2023-02-08 NOTE — Assessment & Plan Note (Addendum)
This is a very pleasant 72 year old male, we have been treating him longitudinally for diabetes, A1c at the last visit was 7.2%, in the spirit of improving his A1c control we discontinued Actos and glipizide, we bumped his Trulicity up to 1.5, unfortunately his hemoglobin A1c has worsened to 10.5%. I would like to go ahead and check a confirmatory A1c with the lab. If still elevated we will increase Trulicity to 3 mg. He declines nausea medicine. If still elevated after 3 months on 3 mg we will go up to 4.5. He is spending a good deal of money, approximate $250 a month on his Trulicity, he will call his insurance company and find if there is a more preferred agent.  Update: Patient is requesting that we just send in the 3 mg Trulicity for now.

## 2023-02-09 LAB — MICROALBUMIN / CREATININE URINE RATIO
Creatinine, Urine: 120 mg/dL (ref 20–320)
Microalb Creat Ratio: 83 mcg/mg creat — ABNORMAL HIGH (ref <30)
Microalb, Ur: 10 mg/dL

## 2023-02-09 LAB — HEMOGLOBIN A1C
Hgb A1c MFr Bld: 9.6 % of total Hgb — ABNORMAL HIGH (ref <5.7)
Mean Plasma Glucose: 229 mg/dL
eAG (mmol/L): 12.7 mmol/L

## 2023-03-15 DIAGNOSIS — H43313 Vitreous membranes and strands, bilateral: Secondary | ICD-10-CM | POA: Diagnosis not present

## 2023-03-15 DIAGNOSIS — H2513 Age-related nuclear cataract, bilateral: Secondary | ICD-10-CM | POA: Diagnosis not present

## 2023-03-16 ENCOUNTER — Encounter: Payer: Self-pay | Admitting: Emergency Medicine

## 2023-03-16 ENCOUNTER — Ambulatory Visit
Admission: EM | Admit: 2023-03-16 | Discharge: 2023-03-16 | Disposition: A | Discharge status: Home / Self Care | Payer: Medicare Other | Attending: Family Medicine | Admitting: Family Medicine

## 2023-03-16 DIAGNOSIS — J0100 Acute maxillary sinusitis, unspecified: Secondary | ICD-10-CM | POA: Diagnosis not present

## 2023-03-16 DIAGNOSIS — J309 Allergic rhinitis, unspecified: Secondary | ICD-10-CM

## 2023-03-16 MED ORDER — FEXOFENADINE HCL 180 MG PO TABS: 180.0000 mg | ORAL_TABLET | Freq: Every day | ORAL | 0 refills | Status: DC

## 2023-03-16 MED ORDER — AMOXICILLIN-POT CLAVULANATE 875-125 MG PO TABS: 1.0000 | ORAL_TABLET | Freq: Two times a day (BID) | ORAL | 0 refills | Status: DC

## 2023-03-16 NOTE — ED Triage Notes (Signed)
Pt reports feeling "foggy", nasal congestion, sneezing, a cough and intermittent chest congestion. States cough sounds like a "hack". Denies fever. Has been taking OTC nasal spray, Dayquil and Nyquil. Symptoms began on Monday

## 2023-03-16 NOTE — ED Provider Notes (Signed)
William Webster CARE    CSN: [id number] Arrival date & time: 03/16/23  0935      History   Chief Complaint Chief Complaint  Patient presents with   Nasal Congestion   Cough    HPI William Webster is a 72 y.o. male.   HPI Pleasant 72 year old male presents feeling foggy, nasal sinus congestion, sneezing and cough with intermittent chest congestion for 5 days.  PMH significant for obesity, 123456 without complication, and malignant melanoma.  Past Medical History:  Diagnosis Date   Anterior pituitary adenoma syndrome ([place])    Bimalleolar fracture of right ankle    Cancer ([place]) 01/2022   Right neck melanoma   Colon polyp    Diabetes mellitus without complication (HCC)    Diabetic neuropathy (HCC)    Family history of cancer of GI tract    Family history of colon cancer    GERD (gastroesophageal reflux disease)    Rosanna Randy disease    Hyperlipidemia    Low back pain    Lumbar scoliosis    Prolactinoma ([place])    Sciatica of right side    Spondylosis of lumbar joint     Patient Active Problem List   Diagnosis Date Noted   Primary osteoarthritis of first carpometacarpal joint of one hand, left 02/08/2023   Achilles tendinosis of left lower extremity 02/08/2023   Hamstring injury, right, initial encounter 10/17/2021   Right foot pain 10/17/2021   Malignant melanoma ([place]) 10/17/2021   Bilateral shoulder pain 09/16/2018   Fracture of clavicle, left, closed 08/01/2018   Screening for colon cancer 12/10/2015   Fracture of ankle, bimalleolar, right, closed 11/22/2015   Pituitary adenoma ([place]) 08/17/2015   Hyperprolactinemia ([place]) 08/17/2015   Obesity 07/15/2015   Spondylosis of lumbar region without myelopathy or radiculopathy 07/15/2015   Primary osteoarthritis of both knees 07/15/2015   Right cervical radiculopathy 01/07/2015   HLD (hyperlipidemia) 01/05/2014   Diabetes mellitus type 2, controlled, without complications ([place]) 123456    Past Surgical History:   Procedure Laterality Date   COLONOSCOPY  2012   [place]   ESOPHAGOGASTRODUODENOSCOPY     With [facility] he had a bad case of reflux-said it been a few years. Maybe about 10 years or 11 years    [place] Right 03/08/2022   Procedure: REEXCISION RIGHT NECK MELANOMA;  Surgeon: [name], MD;  Location: [place];  Service: General;  Laterality: Right;   MELANOMA EXCISION Right 02/13/2022   Procedure: WIDE LOCAL EXCISION WITH ADVANCE FLAP CLOSURE FOR MELANOMA RIGHT NECK;  Surgeon: [name], MD;  Location: [place];  Service: General;  Laterality: Right;   ORIF ANKLE FRACTURE Right 11/24/2015   Procedure: OPEN REDUCTION INTERNAL FIXATION (ORIF) ANKLE FRACTURE;  Surgeon: Dorna Leitz, MD;  Location: [place];  Service: Orthopedics;  Laterality: Right;  Open reduction internal fixation right ankle fracture   POLYPECTOMY     TONSILLECTOMY     UPPER GASTROINTESTINAL ENDOSCOPY         Home Medications    Prior to Admission medications   Medication Sig Start Date End Date Taking? Authorizing Provider  amoxicillin-clavulanate (AUGMENTIN) 875-125 MG tablet Take 1 tablet by mouth every 12 (twelve) hours. 03/16/23  Yes Eliezer Lofts, FNP  fexofenadine [facility] ALLERGY) 180 MG tablet Take 1 tablet (180 mg total) by mouth daily for 15 days. 03/16/23 03/31/23 Yes Eliezer Lofts, FNP  acetaminophen (TYLENOL) 650 MG CR tablet Take 1 tablet (650 mg  total) by mouth every 8 (eight) hours as needed for pain. 02/08/23   Silverio Decamp, MD  cabergoline (DOSTINEX) 0.5 MG tablet TAKE 1 TABLET (0.5 MG TOTAL) BY MOUTH 2 (TWO) TIMES A WEEK. FRIDAYS & MONDAYS 12/04/22   Silverio Decamp, MD  Dulaglutide (TRULICITY) 3 0000000 SOPN Inject 3 mg into the skin once a week. 02/08/23   Silverio Decamp, MD  lisinopril (ZESTRIL) 2.5 MG tablet Take 1 tablet (2.5 mg total) by mouth daily. 10/18/22   Silverio Decamp, MD  metFORMIN  (GLUCOPHAGE) 1000 MG tablet TAKE 1 TABLET (1,000 MG TOTAL) BY MOUTH 2 (TWO) TIMES DAILY WITH A MEAL. 07/03/22   Silverio Decamp, MD  Multiple Vitamins-Minerals (ADULT ONE DAILY GUMMIES PO) Take 2 tablets by mouth in the morning.    [provider]  nitroGLYCERIN (NITRODUR - DOSED IN MG/24 HR) 0.2 mg/hr patch Cut and apply 1/4 patch to most painful area q24h. 02/08/23   Silverio Decamp, MD  rosuvastatin (CRESTOR) 40 MG tablet Take 1 tablet (40 mg total) by mouth daily. Need labs 11/03/22   Silverio Decamp, MD  Wheat Dextrin (BENEFIBER PO) Take 1 Dose by mouth in the morning.    [provider]    Family History Family History  Problem Relation Age of Onset   Colon polyps Mother    Heart failure Mother    Diabetes Mother    Hyperlipidemia Mother    Stroke Mother    Colon cancer Mother        EARLY 89'S   Stroke Father    Hyperlipidemia Father    Diabetes Maternal Grandfather    Heart disease Maternal Grandfather    Stroke Maternal Grandfather    Diabetes Paternal Grandfather    Heart disease Paternal Grandfather    Stroke Paternal Grandfather    Esophageal cancer Neg Hx    Stomach cancer Neg Hx    Rectal cancer Neg Hx     Social History Social History   Tobacco Use   Smoking status: Former    Types: Cigars    Quit date: 03/2020    Years since quitting: 2.9    Passive exposure: Past   Smokeless tobacco: Never   Tobacco comments:    occassional cigar-every month stopped two years ago (03/2020)  Vaping Use   Vaping Use: Never used  Substance Use Topics   Alcohol use: Yes    Alcohol/week: 8.0 standard drinks of alcohol    Types: 8 Standard drinks or equivalent per week   Drug use: No     Allergies   Patient has no known allergies.   Review of Systems Review of Systems  HENT:  Positive for congestion, postnasal drip, sinus pressure and sneezing.   Respiratory:  Positive for cough.   All other systems reviewed and are  negative.    Physical Exam Triage Vital Signs ED Triage Vitals [03/16/23 0956]  Enc Vitals Group     BP (!) 159/84     Pulse Rate 78     Resp 18     Temp 98 F (36.7 C)     Temp Source Oral     SpO2 97 %     Weight      Height      Head Circumference      Peak Flow      Pain Score 0     Pain Loc      Pain Edu?      Excl.  in [place]?    No data found.  Updated Vital Signs BP (!) 159/84 (BP Location: Left Arm)   Pulse 78   Temp 98 F (36.7 C) (Oral)   Resp 18   SpO2 97%     Physical Exam Vitals and nursing note reviewed.  Constitutional:      Appearance: Normal appearance. He is obese. He is ill-appearing.  HENT:     Head: Normocephalic and atraumatic.     Right Ear: Tympanic membrane and external ear normal.     Left Ear: Tympanic membrane and external ear normal.     Ears:     Comments: Significant eustachian tube dysfunction noted bilaterally    Mouth/Throat:     Mouth: Mucous membranes are moist.     Pharynx: Oropharynx is clear.     Comments: Significant amount of clear drainage of posterior oropharynx noted Eyes:     Extraocular Movements: Extraocular movements intact.     Conjunctiva/sclera: Conjunctivae normal.     Pupils: Pupils are equal, round, and reactive to light.  Cardiovascular:     Rate and Rhythm: Normal rate and regular rhythm.     Pulses: Normal pulses.     Heart sounds: Normal heart sounds.  Pulmonary:     Effort: Pulmonary effort is normal.     Breath sounds: Normal breath sounds. No wheezing, rhonchi or rales.  Musculoskeletal:        General: Normal range of motion.     Cervical back: Normal range of motion and neck supple.  Skin:    General: Skin is warm and dry.  Neurological:     General: No focal deficit present.     Mental Status: He is alert and oriented to person, place, and time.      UC Treatments / Results  Labs (all labs ordered are listed, but only abnormal results are displayed) Labs Reviewed - No data to  display  EKG   Radiology No results found.  Procedures Procedures (including critical care time)  Medications Ordered in UC Medications - No data to display  Initial Impression / Assessment and Plan / UC Course  I have reviewed the triage vital signs and the nursing notes.  Pertinent labs & imaging results that were available during my care of the patient were reviewed by me and considered in my medical decision making (see chart for details).     MDM: 1.  Acute maxillary sinusitis, recurrence not specified-Rx'd Augmentin 875 mg twice daily for 7 days; 2.  Allergic rhinitis, unspecified seasonality, unspecified trigger-Rx'd Allegra 180 mg daily x 5 days, then as needed. Instructed patient to take medications as directed with food to completion.  Advised patient to take Allegra with first dose of Augmentin for the next 5 of 7 days.  Advised may use Allegra as needed afterwards for concurrent postnasal drainage/drip.  Encouraged patient increase daily water intake to 64 ounces per day while taking these medications.  Advised if symptoms worsen and/or unresolved please follow-up with PCP or here for further evaluation.  Patient discharged, hemodynamically stable. Final Clinical Impressions(s) / UC Diagnoses   Final diagnoses:  Acute maxillary sinusitis, recurrence not specified  Allergic rhinitis, unspecified seasonality, unspecified trigger     Discharge Instructions      Instructed patient to take medications as directed with food to completion.  Advised patient to take Allegra with first dose of Augmentin for the next 5 of 7 days.  Advised may use Allegra as needed afterwards for concurrent  postnasal drainage/drip.  Encouraged patient increase daily water intake to 64 ounces per day while taking these medications.  Advised if symptoms worsen and/or unresolved please follow-up with PCP or here for further evaluation.     ED Prescriptions     Medication Sig Dispense Auth.  Provider   amoxicillin-clavulanate (AUGMENTIN) 875-125 MG tablet Take 1 tablet by mouth every 12 (twelve) hours. 14 tablet Eliezer Lofts, FNP   fexofenadine [facility] ALLERGY) 180 MG tablet Take 1 tablet (180 mg total) by mouth daily for 15 days. 15 tablet Eliezer Lofts, FNP      PDMP not reviewed this encounter.   Eliezer Lofts, [place] 03/16/23 1056

## 2023-03-16 NOTE — Discharge Instructions (Addendum)
Instructed patient to take medications as directed with food to completion.  Advised patient to take Allegra with first dose of Augmentin for the next 5 of 7 days.  Advised may use Allegra as needed afterwards for concurrent postnasal drainage/drip.  Encouraged patient increase daily water intake to 64 ounces per day while taking these medications.  Advised if symptoms worsen and/or unresolved please follow-up with PCP or here for further evaluation.

## 2023-03-24 ENCOUNTER — Other Ambulatory Visit: Payer: Self-pay | Admitting: Osteopathic Medicine

## 2023-03-24 DIAGNOSIS — E119 Type 2 diabetes mellitus without complications: Secondary | ICD-10-CM

## 2023-04-01 ENCOUNTER — Ambulatory Visit
Admission: EM | Admit: 2023-04-01 | Discharge: 2023-04-01 | Disposition: A | Discharge status: Home / Self Care | Payer: Medicare Other | Attending: Family Medicine | Admitting: Family Medicine

## 2023-04-01 ENCOUNTER — Other Ambulatory Visit: Payer: Self-pay

## 2023-04-01 ENCOUNTER — Ambulatory Visit (INDEPENDENT_AMBULATORY_CARE_PROVIDER_SITE_OTHER): Payer: Medicare Other

## 2023-04-01 DIAGNOSIS — M25572 Pain in left ankle and joints of left foot: Secondary | ICD-10-CM | POA: Diagnosis not present

## 2023-04-01 DIAGNOSIS — M25472 Effusion, left ankle: Secondary | ICD-10-CM | POA: Diagnosis not present

## 2023-04-01 DIAGNOSIS — M7989 Other specified soft tissue disorders: Secondary | ICD-10-CM | POA: Diagnosis not present

## 2023-04-01 MED ORDER — CELECOXIB 100 MG PO CAPS: 100.0000 mg | ORAL_CAPSULE | Freq: Two times a day (BID) | ORAL | 0 refills | Status: AC

## 2023-04-01 NOTE — Discharge Instructions (Addendum)
Advised patient of probable left Achilles tendinitis with x-ray results and images provided to patient.  Advised patient to take medication as directed with food to completion.  Advised if symptoms worsen please follow-up with PCP for further evaluation.

## 2023-04-01 NOTE — ED Triage Notes (Signed)
Pt complains of left foot pain and swelling onset for two weeks

## 2023-04-01 NOTE — ED Provider Notes (Signed)
[facility]    CSN: [id number] Arrival date & time: 04/01/23  1007      History   Chief Complaint Chief Complaint  Patient presents with   Foot Pain    Left foot pain    HPI William Webster is a 72 y.o. male.   HPI Pleasant 72 year old male presents with left ankle pain and swelling for 2 weeks.  PMH significant for obesity, T2 DM, pituitary adenoma, and malignant melanoma.  Patient is accompanied by his wife this morning.  Past Medical History:  Diagnosis Date   Anterior pituitary adenoma syndrome    Bimalleolar fracture of right ankle    Cancer 01/2022   Right neck melanoma   Colon polyp    Diabetes mellitus without complication    Diabetic neuropathy    Family history of cancer of GI tract    Family history of colon cancer    GERD (gastroesophageal reflux disease)    Gilbert disease    Hyperlipidemia    Low back pain    Lumbar scoliosis    Prolactinoma    Sciatica of right side    Spondylosis of lumbar joint     Patient Active Problem List   Diagnosis Date Noted   Primary osteoarthritis of first carpometacarpal joint of one hand, left 02/08/2023   Achilles tendinosis of left lower extremity 02/08/2023   Hamstring injury, right, initial encounter 10/17/2021   Right foot pain 10/17/2021   Malignant melanoma 10/17/2021   Bilateral shoulder pain 09/16/2018   Fracture of clavicle, left, closed 08/01/2018   Screening for colon cancer 12/10/2015   Fracture of ankle, bimalleolar, right, closed 11/22/2015   Pituitary adenoma 08/17/2015   Hyperprolactinemia 08/17/2015   Obesity 07/15/2015   Spondylosis of lumbar region without myelopathy or radiculopathy 07/15/2015   Primary osteoarthritis of both knees 07/15/2015   Right cervical radiculopathy 01/07/2015   HLD (hyperlipidemia) 01/05/2014   Diabetes mellitus type 2, controlled, without complications 01/05/2014    Past Surgical History:  Procedure Laterality Date   COLONOSCOPY  2012   [place]    [facility]AGO[facility]STRODUOD[facility]OSCOPY     [facility]Said he had a bad case of reflux-said it been a few years. Maybe about 10 years or 11 years    HEMORRHOID SURGERY     MASS EXCISION Right 03/08/2022   Procedure: REEXCISION RIGHT NECK MELANOMA;  Surgeon: Almond LintByerly, Faera, MD;  Location: [facility] OR;  Service: General;  Laterality: Right;   MELANOMA EXCISION Right 02/13/2022   Procedure: WIDE LOCAL EXCISION WITH ADVANCE FLAP CLOSURE FOR MELANOMA RIGHT NECK;  Surgeon: Almond LintByerly, Faera, MD;  Location: [facility];  Service: General;  Laterality: Right;   ORIF ANKLE FRACTURE Right 11/24/2015   Procedure: OPEN REDUCTION INTERNAL FIXATION (ORIF) ANKLE FRACTURE;  Surgeon: Jodi GeraldsJohn Graves, MD;  Location: [facility];  Service: Orthopedics;  Laterality: Right;  Open reduction internal fixation right ankle fracture   POLYPECTOMY     TONSILLECTOMY     UPPER GASTROINTESTINAL ENDOSCOPY         Home Medications    Prior to Admission medications   Medication Sig Start Date End Date Taking? Authorizing Provider  celecoxib (CELEBREX) 100 MG capsule Take 1 capsule (100 mg total) by mouth 2 (two) times daily for 15 days. 04/01/23 04/16/23 Yes Trevor Ihaagan, Damario Gillie, FNP  acetaminophen (TYLENOL) 650 MG CR tablet Take 1 tablet (650 mg total) by mouth every 8 (eight) hours as needed for pain. 02/08/23   Monica Bectonhekkekandam, Thomas J, MD  amoxicillin-clavulanate (AUGMENTIN)  875-125 MG tablet Take 1 tablet by mouth every 12 (twelve) hours. 03/16/23   Trevor Iha, FNP  cabergoline (DOSTINEX) 0.5 MG tablet TAKE 1 TABLET (0.5 MG TOTAL) BY MOUTH 2 (TWO) TIMES A WEEK. FRIDAYS & MONDAYS 12/04/22   Monica Becton, MD  Dulaglutide (TRULICITY) 3 MG/0.5ML SOPN Inject 3 mg into the skin once a week. 02/08/23   Monica Becton, MD  fexofenadine [facility] ALLERGY) 180 MG tablet Take 1 tablet (180 mg total) by mouth daily for 15 days. 03/16/23 03/31/23  Trevor Iha, FNP  lisinopril (ZESTRIL) 2.5 MG tablet Take 1 tablet  (2.5 mg total) by mouth daily. 10/18/22   Monica Becton, MD  metFORMIN (GLUCOPHAGE) 1000 MG tablet TAKE 1 TABLET (1,000 MG TOTAL) BY MOUTH 2 (TWO) TIMES DAILY WITH A MEAL. 07/03/22   Monica Becton, MD  Multiple Vitamins-Minerals (ADULT ONE DAILY GUMMIES PO) Take 2 tablets by mouth in the morning.    [provider]  nitroGLYCERIN (NITRODUR - DOSED IN MG/24 HR) 0.2 mg/hr patch Cut and apply 1/4 patch to most painful area q24h. 02/08/23   Monica Becton, MD  rosuvastatin (CRESTOR) 40 MG tablet Take 1 tablet (40 mg total) by mouth daily. Need labs 11/03/22   Monica Becton, MD  Wheat Dextrin (BENEFIBER PO) Take 1 Dose by mouth in the morning.    [provider]    Family History Family History  Problem Relation Age of Onset   Colon polyps Mother    Heart failure Mother    Diabetes Mother    Hyperlipidemia Mother    Stroke Mother    Colon cancer Mother        EARLY 60'S   Stroke Father    Hyperlipidemia Father    Diabetes Maternal Grandfather    Heart disease Maternal Grandfather    Stroke Maternal Grandfather    Diabetes Paternal Grandfather    Heart disease Paternal Grandfather    Stroke Paternal Grandfather    Esophageal cancer Neg Hx    Stomach cancer Neg Hx    Rectal cancer Neg Hx     Social History Social History   Tobacco Use   Smoking status: Former    Types: Cigars    Quit date: 03/2020    Years since quitting: 3.0    Passive exposure: Past   Smokeless tobacco: Never   Tobacco comments:    occassional cigar-every month stopped two years ago (03/2020)  Vaping Use   Vaping Use: Never used  Substance Use Topics   Alcohol use: Yes    Alcohol/week: 8.0 standard drinks of alcohol    Types: 8 Standard drinks or equivalent per week   Drug use: No     Allergies   Patient has no known allergies.   Review of Systems Review of Systems  Musculoskeletal:        Left ankle pain x 2 weeks  All other systems reviewed  and are negative.    Physical Exam Triage Vital Signs ED Triage Vitals  Enc Vitals Group     BP 04/01/23 1035 126/78     Pulse Rate 04/01/23 1035 65     Resp 04/01/23 1035 16     Temp 04/01/23 1035 98.3 F (36.8 C)     Temp Source 04/01/23 1035 Oral     SpO2 04/01/23 1035 97 %     Weight 04/01/23 1038 235 lb (106.6 kg)     Height 04/01/23 1038 6' (1.829 m)  Head Circumference --      Peak Flow --      Pain Score 04/01/23 1037 5     Pain Loc --      Pain Edu? --      Excl. in GC? --    No data found.  Updated Vital Signs BP 126/78 (BP Location: Right Arm)   Pulse 65   Temp 98.3 F (36.8 C) (Oral)   Resp 16   Ht 6' (1.829 m)   Wt 235 lb (106.6 kg)   SpO2 97%   BMI 31.87 kg/m       Physical Exam Vitals and nursing note reviewed.  Constitutional:      Appearance: Normal appearance. He is normal weight.  HENT:     Head: Normocephalic and atraumatic.     Mouth/Throat:     Mouth: Mucous membranes are moist.     Pharynx: Oropharynx is clear.  Eyes:     Extraocular Movements: Extraocular movements intact.     Conjunctiva/sclera: Conjunctivae normal.     Pupils: Pupils are equal, round, and reactive to light.  Cardiovascular:     Rate and Rhythm: Normal rate and regular rhythm.     Pulses: Normal pulses.     Heart sounds: Normal heart sounds.  Pulmonary:     Effort: Pulmonary effort is normal.     Breath sounds: Normal breath sounds. No wheezing, rhonchi or rales.  Musculoskeletal:        General: Normal range of motion.     Cervical back: Normal range of motion and neck supple.     Comments: Left ankle: TTP over superior Achilles tendon with mild soft tissue swelling  Skin:    General: Skin is warm and dry.  Neurological:     General: No focal deficit present.     Mental Status: He is alert and oriented to person, place, and time. Mental status is at baseline.     Gait: Gait abnormal.      UC Treatments / Results  Labs (all labs ordered are  listed, but only abnormal results are displayed) Labs Reviewed - No data to display  EKG   Radiology DG Ankle Complete Left  Result Date: 04/01/2023 CLINICAL DATA:  Left heel and posterior ankle pain and swelling for the past week with swelling and numbness in the region of the Achilles tendon. No known injury. EXAM: LEFT ANKLE COMPLETE - 3+ VIEW COMPARISON:  None Available. FINDINGS: Soft tissue swelling inferior and posterior to the calcaneus and in the posterior aspect of the lower leg with thickening of the distal Achilles tendon. Mild inferior calcaneal enthesophyte formation. Small oval ossicle adjacent to the medial malleolus. No fracture, dislocation or effusion. IMPRESSION: 1. Findings compatible with Achilles tendonitis. 2. Mild inferior calcaneal enthesophyte formation. Electronically Signed   By: Beckie Salts M.D.   On: 04/01/2023 11:22    Procedures Procedures (including critical care time)  Medications Ordered in UC Medications - No data to display  Initial Impression / Assessment and Plan / UC Course  I have reviewed the triage vital signs and the nursing notes.  Pertinent labs & imaging results that were available during my care of the patient were reviewed by me and considered in my medical decision making (see chart for details).     MDM: 1.  Acute left ankle pain-left ankle x-ray revealed above.  Patient advised.  Rx'd Celebrex 100 mg twice daily x 15 days. Advised patient of probable left Achilles tendinitis with  x-ray results and images provided to patient.  Advised patient to take medication as directed with food to completion.  Advised if symptoms worsen please follow-up with PCP for further evaluation.  Patient discharged home, hemodynamically stable. Final Clinical Impressions(s) / UC Diagnoses   Final diagnoses:  Acute left ankle pain     Discharge Instructions      Advised patient of probable left Achilles tendinitis with x-ray results and images provided  to patient.  Advised patient to take medication as directed with food to completion.  Advised if symptoms worsen please follow-up with PCP for further evaluation.     ED Prescriptions     Medication Sig Dispense Auth. Provider   celecoxib (CELEBREX) 100 MG capsule Take 1 capsule (100 mg total) by mouth 2 (two) times daily for 15 days. 30 capsule Trevor Iha, FNP      PDMP not reviewed this encounter.   Trevor Iha, FNP 04/01/23 1204

## 2023-04-02 ENCOUNTER — Ambulatory Visit (INDEPENDENT_AMBULATORY_CARE_PROVIDER_SITE_OTHER): Payer: Medicare Other | Admitting: Sports Medicine

## 2023-04-02 DIAGNOSIS — M6788 Other specified disorders of synovium and tendon, other site: Secondary | ICD-10-CM

## 2023-04-02 MED ORDER — NITROGLYCERIN 2 % TD OINT: TOPICAL_OINTMENT | TRANSDERMAL | 11 refills | Status: DC

## 2023-04-02 NOTE — Assessment & Plan Note (Signed)
Pleasant 72 year old male, does a lot of walking at work, we have been treating him now for several months for Achilles tendinitis, he went to urgent care recently for acute worsening. He has fairly severe pain mid Achilles with a palpable nodule. At this point he has failed some nitroglycerin, he had a skin reaction. He really did not do much in terms of physical therapy. I explained him that we do need topical nitroglycerin, he can continue Celebrex, I would like him to do formal physical therapy with eccentric conditioning, I also think he needs to wear a long boot for the next month considering the severity of his pain. He will get a handicap placard and an order for transportation in the airport. Return to see me in approximately 8 weeks.

## 2023-04-02 NOTE — Progress Notes (Signed)
    Procedures performed today:    None.  Independent interpretation of notes and tests performed by another provider:   None.  Brief History, Exam, Impression, and Recommendations:    Achilles tendinosis of left lower extremity Pleasant 72 year old male, does a lot of walking at work, we have been treating him now for several months for Achilles tendinitis, he went to urgent care recently for acute worsening. He has fairly severe pain mid Achilles with a palpable nodule. At this point he has failed some nitroglycerin, he had a skin reaction. He really did not do much in terms of physical therapy. I explained him that we do need topical nitroglycerin, he can continue Celebrex, I would like him to do formal physical therapy with eccentric conditioning, I also think he needs to wear a long boot for the next month considering the severity of his pain. He will get a handicap placard and an order for transportation in the airport. Return to see me in approximately 8 weeks.    ____________________________________________ Ihor Austin. Benjamin Stain, M.D., ABFM., CAQSM., AME. Primary Care and Sports Medicine [facility]  Adjunct Professor of Family Medicine  [employer]  Restaurant manager, fast food

## 2023-04-03 ENCOUNTER — Encounter: Payer: Self-pay | Admitting: Sports Medicine

## 2023-04-04 DIAGNOSIS — R269 Unspecified abnormalities of gait and mobility: Secondary | ICD-10-CM | POA: Diagnosis not present

## 2023-04-04 DIAGNOSIS — M6281 Muscle weakness (generalized): Secondary | ICD-10-CM | POA: Diagnosis not present

## 2023-04-04 DIAGNOSIS — M25572 Pain in left ankle and joints of left foot: Secondary | ICD-10-CM | POA: Diagnosis not present

## 2023-04-06 DIAGNOSIS — M25572 Pain in left ankle and joints of left foot: Secondary | ICD-10-CM | POA: Diagnosis not present

## 2023-04-06 DIAGNOSIS — M6281 Muscle weakness (generalized): Secondary | ICD-10-CM | POA: Diagnosis not present

## 2023-04-06 DIAGNOSIS — R269 Unspecified abnormalities of gait and mobility: Secondary | ICD-10-CM | POA: Diagnosis not present

## 2023-04-09 DIAGNOSIS — R269 Unspecified abnormalities of gait and mobility: Secondary | ICD-10-CM | POA: Diagnosis not present

## 2023-04-09 DIAGNOSIS — M6281 Muscle weakness (generalized): Secondary | ICD-10-CM | POA: Diagnosis not present

## 2023-04-09 DIAGNOSIS — M25572 Pain in left ankle and joints of left foot: Secondary | ICD-10-CM | POA: Diagnosis not present

## 2023-04-11 ENCOUNTER — Ambulatory Visit (INDEPENDENT_AMBULATORY_CARE_PROVIDER_SITE_OTHER): Payer: Medicare Other | Admitting: Sports Medicine

## 2023-04-11 ENCOUNTER — Encounter (INDEPENDENT_AMBULATORY_CARE_PROVIDER_SITE_OTHER): Payer: Medicare Other | Admitting: Sports Medicine

## 2023-04-11 DIAGNOSIS — R269 Unspecified abnormalities of gait and mobility: Secondary | ICD-10-CM | POA: Diagnosis not present

## 2023-04-11 DIAGNOSIS — E119 Type 2 diabetes mellitus without complications: Secondary | ICD-10-CM

## 2023-04-11 DIAGNOSIS — M6281 Muscle weakness (generalized): Secondary | ICD-10-CM | POA: Diagnosis not present

## 2023-04-11 DIAGNOSIS — Z0000 Encounter for general adult medical examination without abnormal findings: Secondary | ICD-10-CM

## 2023-04-11 DIAGNOSIS — M25572 Pain in left ankle and joints of left foot: Secondary | ICD-10-CM | POA: Diagnosis not present

## 2023-04-11 NOTE — Patient Instructions (Addendum)
MEDICARE ANNUAL WELLNESS VISIT Health Maintenance Summary and Written Plan of Care  William Webster ,  Thank you for allowing me to perform your Medicare Annual Wellness Visit and for your ongoing commitment to your health.   Health Maintenance & Immunization History Health Maintenance  Topic Date Due   DTaP/Tdap/Td (2 - Td or Tdap) 03/18/2023   OPHTHALMOLOGY EXAM  04/11/2023 (Originally 06/11/2021)   FOOT EXAM  04/25/2023 (Originally 01/27/2023)   COVID-19 Vaccine (5 - 2023-24 season) 04/27/2023 (Originally 08/25/2022)   Pneumonia Vaccine 69+ Years old (2 of 2 - PCV) 04/10/2024 (Originally 01/27/2022)   Diabetic kidney evaluation - eGFR measurement  06/13/2023   INFLUENZA VACCINE  07/26/2023   HEMOGLOBIN A1C  08/09/2023   Diabetic kidney evaluation - Urine ACR  02/09/2024   Medicare Annual Wellness (AWV)  04/10/2024   COLONOSCOPY (Pts 45-13yrs Insurance coverage will need to be confirmed)  07/28/2027   Hepatitis C Screening  Completed   Zoster Vaccines- Shingrix  Completed   HPV VACCINES  Aged Out   Fecal DNA (Cologuard)  Discontinued   Immunization History  Administered Date(s) Administered   Fluad Quad(high Dose 65+) 08/27/2019   Influenza, High Dose Seasonal PF 08/13/2020   Influenza,inj,Quad PF,6+ Mos 08/23/2015, 09/16/2018, 09/02/2021   Influenza-Unspecified 09/07/2016, 10/08/2017   PFIZER(Purple Top)SARS-COV-2 Vaccination 03/01/2020, 03/22/2020, 10/14/2020, 03/08/2021   Pneumococcal Polysaccharide-23 01/12/2016, 01/27/2021   Tdap 03/17/2013   Varicella 10/13/2021   Zoster Recombinat (Shingrix) 01/27/2021, 10/13/2021   Zoster, Live 08/23/2015    These are the patient goals that we discussed:  Goals Addressed               This Visit's Progress     Patient Stated (pt-stated)        04/11/2023 AWV Goal: Diabetes Management  Patient will maintain an A1C level below 8.0 Patient will not develop any diabetic foot complications Patient will not experience any  hypoglycemic episodes over the next 3 months Patient will notify our office of any CBG readings outside of the provider recommended range by calling [phone] Patient will adhere to provider recommendations for diabetes management  Patient Self Management Activities take all medications as prescribed and report any negative side effects monitor and record blood sugar readings as directed adhere to a low carbohydrate diet that incorporates lean proteins, vegetables, whole grains, low glycemic fruits check feet daily noting any sores, cracks, injuries, or callous formations see PCP or podiatrist if he notices any changes in his legs, feet, or toenails Patient will visit PCP and have an A1C level checked every 3 to 6 months as directed  have a yearly eye exam to monitor for vascular changes associated with diabetes and will request that the report be sent to his pcp.  consult with his PCP regarding any changes in his health or new or worsening symptoms          This is a list of Health Maintenance Items that are overdue or due now: Health Maintenance Due  Topic Date Due   DTaP/Tdap/Td (2 - Td or Tdap) 03/18/2023   Pneumococcal vaccine  Td vaccine Foot exam   Orders/Referrals Placed Today: No orders of the defined types were placed in this encounter.  (Contact our referral department at [phone] if you have not spoken with someone about your referral appointment within the next 5 days)    Follow-up Plan Follow-up with Monica Becton, MD as planned Pneumonia vaccine can be done at the office and schedule tetanus at the pharmacy. Foot  exam can be done at the next visit.  Medicare wellness visit in one year. Patient will access AVS on my chart.      Health Maintenance, Male Adopting a healthy lifestyle and getting preventive care are important in promoting health and wellness. Ask your health care provider about: The right schedule for you to have regular tests and  exams. Things you can do on your own to prevent diseases and keep yourself healthy. What should I know about diet, weight, and exercise? Eat a healthy diet  Eat a diet that includes plenty of vegetables, fruits, low-fat dairy products, and lean protein. Do not eat a lot of foods that are high in solid fats, added sugars, or sodium. Maintain a healthy weight Body mass index (BMI) is a measurement that can be used to identify possible weight problems. It estimates body fat based on height and weight. Your health care provider can help determine your BMI and help you achieve or maintain a healthy weight. Get regular exercise Get regular exercise. This is one of the most important things you can do for your health. Most adults should: Exercise for at least 150 minutes each week. The exercise should increase your heart rate and make you sweat (moderate-intensity exercise). Do strengthening exercises at least twice a week. This is in addition to the moderate-intensity exercise. Spend less time sitting. Even light physical activity can be beneficial. Watch cholesterol and blood lipids Have your blood tested for lipids and cholesterol at 70 years of age, then have this test every 5 years. You may need to have your cholesterol levels checked more often if: Your lipid or cholesterol levels are high. You are older than 70 years of age. You are at high risk for heart disease. What should I know about cancer screening? Many types of cancers can be detected early and may often be prevented. Depending on your health history and family history, you may need to have cancer screening at various ages. This may include screening for: Colorectal cancer. Prostate cancer. Skin cancer. Lung cancer. What should I know about heart disease, diabetes, and high blood pressure? Blood pressure and heart disease High blood pressure causes heart disease and increases the risk of stroke. This is more likely to develop in  people who have high blood pressure readings or are overweight. Talk with your health care provider about your target blood pressure readings. Have your blood pressure checked: Every 3-5 years if you are 57-26 years of age. Every year if you are 5 years old or older. If you are between the ages of 75 and 47 and are a current or former smoker, ask your health care provider if you should have a one-time screening for abdominal aortic aneurysm (AAA). Diabetes Have regular diabetes screenings. This checks your fasting blood sugar level. Have the screening done: Once every three years after age 19 if you are at a normal weight and have a low risk for diabetes. More often and at a younger age if you are overweight or have a high risk for diabetes. What should I know about preventing infection? Hepatitis B If you have a higher risk for hepatitis B, you should be screened for this virus. Talk with your health care provider to find out if you are at risk for hepatitis B infection. Hepatitis C Blood testing is recommended for: Everyone born from [birth year] through 1963. Anyone with known risk factors for hepatitis C. Sexually transmitted infections (STIs) You should be screened each year for  STIs, including gonorrhea and chlamydia, if: You are sexually active and are younger than 70 years of age. You are older than 70 years of age and your health care provider tells you that you are at risk for this type of infection. Your sexual activity has changed since you were last screened, and you are at increased risk for chlamydia or gonorrhea. Ask your health care provider if you are at risk. Ask your health care provider about whether you are at high risk for HIV. Your health care provider may recommend a prescription medicine to help prevent HIV infection. If you choose to take medicine to prevent HIV, you should first get tested for HIV. You should then be tested every 3 months for as long as you are taking the  medicine. Follow these instructions at home: Alcohol use Do not drink alcohol if your health care provider tells you not to drink. If you drink alcohol: Limit how much you have to 0-2 drinks a day. Know how much alcohol is in your drink. In the [place] one drink equals one 12 oz bottle of beer (355 mL), one 5 oz glass of wine (148 mL), or one 1 oz glass of hard liquor (44 mL). Lifestyle Do not use any products that contain nicotine or tobacco. These products include cigarettes, chewing tobacco, and vaping devices, such as e-cigarettes. If you need help quitting, ask your health care provider. Do not use street drugs. Do not share needles. Ask your health care provider for help if you need support or information about quitting drugs. General instructions Schedule regular health, dental, and eye exams. Stay current with your vaccines. Tell your health care provider if: You often feel depressed. You have ever been abused or do not feel safe at home. Summary Adopting a healthy lifestyle and getting preventive care are important in promoting health and wellness. Follow your health care provider's instructions about healthy diet, exercising, and getting tested or screened for diseases. Follow your health care provider's instructions on monitoring your cholesterol and blood pressure. This information is not intended to replace advice given to you by your health care provider. Make sure you discuss any questions you have with your health care provider. Document Revised: 05/02/2021 Document Reviewed: 05/02/2021 Elsevier Patient Education  2023 [employer].

## 2023-04-11 NOTE — Progress Notes (Signed)
MEDICARE ANNUAL WELLNESS VISIT  04/11/2023  Telephone Visit Disclaimer This Medicare AWV was conducted by telephone due to national recommendations for restrictions regarding the COVID-19 Pandemic (e.g. social distancing).  I verified, using two identifiers, that I am speaking with William Webster or their authorized healthcare agent. I discussed the limitations, risks, security, and privacy concerns of performing an evaluation and management service by telephone and the potential availability of an in-person appointment in the future. The patient expressed understanding and agreed to proceed.  Location of Patient: Home Location of Provider (nurse):  In the office.  Subjective:    William Webster is a 72 y.o. male patient of Thekkekandam, Ihor Austin, MD who had a Medicare Annual Wellness Visit today via telephone. [name] is Retired and lives with their spouse. he has 2 children. he reports that he is socially active and does interact with friends/family regularly. he is moderately physically active and enjoys going to the car shows.   Patient Care Team: Monica Becton, MD as PCP - General (Family Medicine)     04/11/2023    2:13 PM 03/27/2022    2:21 PM 03/08/2022    9:05 AM 02/13/2022   11:49 AM 02/10/2022    1:44 PM 11/24/2015    7:22 AM 11/23/2015   12:59 PM  Advanced Directives  Does Patient Have a Medical Advance Directive? No No No No No No No  Would patient like information on creating a medical advance directive? No - Patient declined No - Patient declined No - Patient declined No - Patient declined No - Patient declined      Hospital Utilization Over the Past 12 Months: # of hospitalizations or ER visits: 1 # of surgeries: 2  Review of Systems    Patient reports that his overall health is better compared to last year.  History obtained from chart review and the patient  Patient Reported Readings (BP, Pulse, CBG, Weight, etc) Weight 242 lb  Pain  Assessment Pain : 0-10 Pain Score: 3  Pain Type: Acute pain Pain Location: Other (Comment) (achilles tendon) Pain Orientation: Left Pain Descriptors / Indicators: Constant Pain Onset: More than a month ago Pain Frequency: Intermittent Pain Relieving Factors: physical therapy  Pain Relieving Factors: physical therapy  Current Medications & Allergies (verified) Allergies as of 04/11/2023   No Known Allergies      Medication List        Accurate as of April 11, 2023  2:23 PM. If you have any questions, ask your nurse or doctor.          acetaminophen 650 MG CR tablet Commonly known as: TYLENOL Take 1 tablet (650 mg total) by mouth every 8 (eight) hours as needed for pain.   ADULT ONE DAILY GUMMIES PO Take 2 tablets by mouth in the morning.   amoxicillin-clavulanate 875-125 MG tablet Commonly known as: AUGMENTIN Take 1 tablet by mouth every 12 (twelve) hours.   BENEFIBER PO Take 1 Dose by mouth in the morning.   cabergoline 0.5 MG tablet Commonly known as: DOSTINEX TAKE 1 TABLET (0.5 MG TOTAL) BY MOUTH 2 (TWO) TIMES A WEEK. FRIDAYS & MONDAYS   celecoxib 100 MG capsule Commonly known as: CeleBREX Take 1 capsule (100 mg total) by mouth 2 (two) times daily for 15 days.   fexofenadine 180 MG tablet Commonly known as: Allegra Allergy Take 1 tablet (180 mg total) by mouth daily for 15 days.   lisinopril 2.5 MG tablet Commonly known as: ZESTRIL Take 1  tablet (2.5 mg total) by mouth daily.   metFORMIN 1000 MG tablet Commonly known as: GLUCOPHAGE TAKE 1 TABLET (1,000 MG TOTAL) BY MOUTH 2 (TWO) TIMES DAILY WITH A MEAL.   nitroGLYCERIN 2 % ointment Commonly known as: NITROGLYN Apply 1 inch ribbon to affected area on Achilles 4 times daily   rosuvastatin 40 MG tablet Commonly known as: CRESTOR Take 1 tablet (40 mg total) by mouth daily. Need labs   Trulicity 3 MG/0.5ML Sopn Generic drug: Dulaglutide Inject 3 mg into the skin once a week.        History  (reviewed): Past Medical History:  Diagnosis Date   Anterior pituitary adenoma syndrome    Bimalleolar fracture of right ankle    Cancer 01/2022   Right neck melanoma   Colon polyp    Diabetes mellitus without complication    Diabetic neuropathy    Family history of cancer of GI tract    Family history of colon cancer    GERD (gastroesophageal reflux disease)    Gilbert disease    Hyperlipidemia    Low back pain    Lumbar scoliosis    Prolactinoma    Sciatica of right side    Spondylosis of lumbar joint    Past Surgical History:  Procedure Laterality Date   COLONOSCOPY  2012   [place]   ESOPHAGOGASTRODUODENOSCOPY     With [facility] he had a bad case of reflux-said it been a few years. Maybe about 10 years or 11 years    FRACTURE SURGERY  2018   HEMORRHOID SURGERY     MASS EXCISION Right 03/08/2022   Procedure: REEXCISION RIGHT NECK MELANOMA;  Surgeon: Almond Lint, MD;  Location: [facility] OR;  Service: General;  Laterality: Right;   MELANOMA EXCISION Right 02/13/2022   Procedure: WIDE LOCAL EXCISION WITH ADVANCE FLAP CLOSURE FOR MELANOMA RIGHT NECK;  Surgeon: Almond Lint, MD;  Location: [facility];  Service: General;  Laterality: Right;   ORIF ANKLE FRACTURE Right 11/24/2015   Procedure: OPEN REDUCTION INTERNAL FIXATION (ORIF) ANKLE FRACTURE;  Surgeon: Jodi Geralds, MD;  Location:  SURGERY CENTER;  Service: Orthopedics;  Laterality: Right;  Open reduction internal fixation right ankle fracture   POLYPECTOMY     TONSILLECTOMY     UPPER GASTROINTESTINAL ENDOSCOPY     Family History  Problem Relation Age of Onset   Colon polyps Mother    Heart failure Mother    Diabetes Mother    Hyperlipidemia Mother    Stroke Mother    Colon cancer Mother        EARLY 16'S   Arthritis Mother    Cancer Mother    Heart disease Mother    Stroke Father    Hyperlipidemia Father    Cancer Father    Diabetes Maternal Grandfather    Heart disease Maternal  Grandfather    Stroke Maternal Grandfather    Diabetes Paternal Grandfather    Heart disease Paternal Grandfather    Stroke Paternal Grandfather    Esophageal cancer Neg Hx    Stomach cancer Neg Hx    Rectal cancer Neg Hx    Social History   Socioeconomic History   Marital status: Married    Spouse name: [name]   Number of children: 2   Years of education: 12th grade   Highest education level: 12th grade  Occupational History    Comment: Part-time   Occupation: Retired  Tobacco Use   Smoking status: Former    Types:  Cigars    Quit date: 03/2020    Years since quitting: 3.0    Passive exposure: Past   Smokeless tobacco: Never   Tobacco comments:    occassional cigar-every month stopped several years ago (03/2020)  Vaping Use   Vaping Use: Never used  Substance and Sexual Activity   Alcohol use: Not Currently    Comment: socially   Drug use: No   Sexual activity: Yes    Birth control/protection: None  Other Topics Concern   Not on file  Social History Narrative   Retired. Lives with wife. He has two daughters (one lives in [place] and one in [place]). Enjoys going to the car shows.    Social Determinants of Health   Financial Resource Strain: Low Risk  (04/07/2023)   Overall Financial Resource Strain (CARDIA)    Difficulty of Paying Living Expenses: Not hard at all  Food Insecurity: No Food Insecurity (04/07/2023)   Hunger Vital Sign    Worried About Running Out of Food in the Last Year: Never true    Ran Out of Food in the Last Year: Never true  Transportation Needs: No Transportation Needs (04/07/2023)   PRAPARE - Administrator, Civil Service (Medical): No    Lack of Transportation (Non-Medical): No  Physical Activity: Insufficiently Active (04/07/2023)   Exercise Vital Sign    Days of Exercise per Week: 3 days    Minutes of Exercise per Session: 30 min  Stress: No Stress Concern Present (04/07/2023)   [employer] of Occupational Health  - Occupational Stress Questionnaire    Feeling of Stress : Not at all  Social Connections: Moderately Integrated (04/11/2023)   Social Connection and Isolation Panel [NHANES]    Frequency of Communication with Friends and Family: More than three times a week    Frequency of Social Gatherings with Friends and Family: Once a week    Attends Religious Services: 1 to 4 times per year    Active Member of [employer] or Organizations: No    Attends Banker Meetings: Never    Marital Status: Married    Activities of Daily Living    04/07/2023   11:40 AM  In your present state of health, do you have any difficulty performing the following activities:  Hearing? 0  Vision? 0  Difficulty concentrating or making decisions? 0  Walking or climbing stairs? 0  Dressing or bathing? 0  Doing errands, shopping? 0  Preparing Food and eating ? N  Using the Toilet? N  In the past six months, have you accidently leaked urine? N  Do you have problems with loss of bowel control? N  Managing your Medications? N  Managing your Finances? N  Housekeeping or managing your Housekeeping? N    Patient Education/ Literacy How often do you need to have someone help you when you read instructions, pamphlets, or other written materials from your doctor or pharmacy?: 1 - Never  Exercise Current Exercise Habits: Home exercise routine, Type of exercise: walking;stretching, Time (Minutes): 30, Frequency (Times/Week): 3, Weekly Exercise (Minutes/Week): 90, Intensity: Moderate, Exercise limited by: None identified  Diet Patient reports consuming 2 meals a day and 1 snack(s) a day Patient reports that his primary diet is: Regular Patient reports that she does have regular access to food.   Depression Screen    04/11/2023    2:04 PM 02/08/2023    8:38 AM 03/27/2022    2:24 PM 01/27/2022    8:33 AM 04/21/2020  8:15 AM 10/16/2019    8:47 AM 01/13/2019    8:41 AM  PHQ 2/9 Scores  PHQ - 2 Score 0 0 0 0 0 0 0   PHQ- 9 Score     0       Fall Risk    04/11/2023    2:13 PM 04/07/2023   11:40 AM 02/08/2023    8:38 AM 03/27/2022    2:24 PM 01/27/2022    8:33 AM  Fall Risk   Falls in the past year? 1 1 0 0 0  Number falls in past yr: 0 0 0 0 0  Injury with Fall? 0 0 0 0 0  Risk for fall due to : Orthopedic patient   No Fall Risks   Follow up Falls evaluation completed;Education provided;Falls prevention discussed  Falls evaluation completed Falls evaluation completed      Objective:  William Webster seemed alert and oriented and he participated appropriately during our telephone visit.  Blood Pressure Weight BMI  BP Readings from Last 3 Encounters:  04/01/23 126/78  03/16/23 (!) 159/84  02/08/23 135/77   Wt Readings from Last 3 Encounters:  04/01/23 235 lb (106.6 kg)  11/03/22 255 lb (115.7 kg)  08/03/22 265 lb 0.6 oz (120.2 kg)   BMI Readings from Last 1 Encounters:  04/01/23 31.87 kg/m    *Unable to obtain current vital signs, weight, and BMI due to telephone visit type  Hearing/Vision  William Webster did not seem to have difficulty with hearing/understanding during the telephone conversation Reports that he has had a formal eye exam by an eye care professional within the past year Reports that he has had a formal hearing evaluation within the past year *Unable to fully assess hearing and vision during telephone visit type  Cognitive Function:    04/11/2023    2:15 PM 03/27/2022    2:33 PM  6CIT Screen  What Year? 0 points 0 points  What month? 0 points 0 points  What time? 0 points 0 points  Count back from 20 0 points 0 points  Months in reverse 0 points 0 points  Repeat phrase 0 points 2 points  Total Score 0 points 2 points   (Normal:0-7, Significant for Dysfunction: >8)  Normal Cognitive Function Screening: Yes   Immunization & Health Maintenance Record Immunization History  Administered Date(s) Administered   Fluad Quad(high Dose 65+) 08/27/2019   Influenza, High  Dose Seasonal PF 08/13/2020   Influenza,inj,Quad PF,6+ Mos 08/23/2015, 09/16/2018, 09/02/2021   Influenza-Unspecified 09/07/2016, 10/08/2017   PFIZER(Purple Top)SARS-COV-2 Vaccination 03/01/2020, 03/22/2020, 10/14/2020, 03/08/2021   Pneumococcal Polysaccharide-23 01/12/2016, 01/27/2021   Tdap 03/17/2013   Varicella 10/13/2021   Zoster Recombinat (Shingrix) 01/27/2021, 10/13/2021   Zoster, Live 08/23/2015    Health Maintenance  Topic Date Due   DTaP/Tdap/Td (2 - Td or Tdap) 03/18/2023   OPHTHALMOLOGY EXAM  04/11/2023 (Originally 06/11/2021)   FOOT EXAM  04/25/2023 (Originally 01/27/2023)   COVID-19 Vaccine (5 - 2023-24 season) 04/27/2023 (Originally 08/25/2022)   Pneumonia Vaccine 78+ Years old (2 of 2 - PCV) 04/10/2024 (Originally 01/27/2022)   Diabetic kidney evaluation - eGFR measurement  06/13/2023   INFLUENZA VACCINE  07/26/2023   HEMOGLOBIN A1C  08/09/2023   Diabetic kidney evaluation - Urine ACR  02/09/2024   Medicare Annual Wellness (AWV)  04/10/2024   COLONOSCOPY (Pts 45-49yrs Insurance coverage will need to be confirmed)  07/28/2027   Hepatitis C Screening  Completed   Zoster Vaccines- Shingrix  Completed   HPV VACCINES  Aged Out   Fecal DNA (Cologuard)  Discontinued       Assessment  This is a routine wellness examination for [employer].  Health Maintenance: Due or Overdue Health Maintenance Due  Topic Date Due   DTaP/Tdap/Td (2 - Td or Tdap) 03/18/2023    William Webster does not need a referral for Community Assistance: Care Management:   no Social Work:    no Prescription Assistance:  no Nutrition/Diabetes Education:  no   Plan:  Personalized Goals  Goals Addressed               This Visit's Progress     Patient Stated (pt-stated)        04/11/2023 AWV Goal: Diabetes Management  Patient will maintain an A1C level below 8.0 Patient will not develop any diabetic foot complications Patient will not experience any hypoglycemic episodes over  the next 3 months Patient will notify our office of any CBG readings outside of the provider recommended range by calling [phone] Patient will adhere to provider recommendations for diabetes management  Patient Self Management Activities take all medications as prescribed and report any negative side effects monitor and record blood sugar readings as directed adhere to a low carbohydrate diet that incorporates lean proteins, vegetables, whole grains, low glycemic fruits check feet daily noting any sores, cracks, injuries, or callous formations see PCP or podiatrist if he notices any changes in his legs, feet, or toenails Patient will visit PCP and have an A1C level checked every 3 to 6 months as directed  have a yearly eye exam to monitor for vascular changes associated with diabetes and will request that the report be sent to his pcp.  consult with his PCP regarding any changes in his health or new or worsening symptoms        Personalized Health Maintenance & Screening Recommendations  Pneumococcal vaccine  Td vaccine Foot exam   Lung Cancer Screening Recommended: no (Low Dose CT Chest recommended if Age 50-80 years, 20 pack-year currently smoking OR have quit w/in past 15 years) Hepatitis C Screening recommended: no HIV Screening recommended: no  Advanced Directives: Written information was not prepared per patient's request.  Referrals & Orders No orders of the defined types were placed in this encounter.   Follow-up Plan Follow-up with Monica Becton, MD as planned Pneumonia vaccine can be done at the office and schedule tetanus at the pharmacy. Foot exam can be done at the next visit.  Medicare wellness visit in one year. Patient will access AVS on my chart.   I have personally reviewed and noted the following in the patient's chart:   Medical and social history Use of alcohol, tobacco or illicit drugs  Current medications and supplements Functional  ability and status Nutritional status Physical activity Advanced directives List of other physicians Hospitalizations, surgeries, and ER visits in previous 12 months Vitals Screenings to include cognitive, depression, and falls Referrals and appointments  In addition, I have reviewed and discussed with William Webster certain preventive protocols, quality metrics, and best practice recommendations. A written personalized care plan for preventive services as well as general preventive health recommendations is available and can be mailed to the patient at his request.      Modesto Charon, RN BSN  04/11/2023

## 2023-04-12 NOTE — Telephone Encounter (Signed)
I spent 5 total minutes of online digital evaluation and management services in this patient-initiated request for online care. 

## 2023-04-16 DIAGNOSIS — M6281 Muscle weakness (generalized): Secondary | ICD-10-CM | POA: Diagnosis not present

## 2023-04-16 DIAGNOSIS — M25572 Pain in left ankle and joints of left foot: Secondary | ICD-10-CM | POA: Diagnosis not present

## 2023-04-16 DIAGNOSIS — R269 Unspecified abnormalities of gait and mobility: Secondary | ICD-10-CM | POA: Diagnosis not present

## 2023-04-25 DIAGNOSIS — M25572 Pain in left ankle and joints of left foot: Secondary | ICD-10-CM | POA: Diagnosis not present

## 2023-04-25 DIAGNOSIS — R269 Unspecified abnormalities of gait and mobility: Secondary | ICD-10-CM | POA: Diagnosis not present

## 2023-04-25 DIAGNOSIS — M6281 Muscle weakness (generalized): Secondary | ICD-10-CM | POA: Diagnosis not present

## 2023-04-27 DIAGNOSIS — M25572 Pain in left ankle and joints of left foot: Secondary | ICD-10-CM | POA: Diagnosis not present

## 2023-04-27 DIAGNOSIS — M6281 Muscle weakness (generalized): Secondary | ICD-10-CM | POA: Diagnosis not present

## 2023-04-27 DIAGNOSIS — R269 Unspecified abnormalities of gait and mobility: Secondary | ICD-10-CM | POA: Diagnosis not present

## 2023-04-30 DIAGNOSIS — M25572 Pain in left ankle and joints of left foot: Secondary | ICD-10-CM | POA: Diagnosis not present

## 2023-04-30 DIAGNOSIS — M6281 Muscle weakness (generalized): Secondary | ICD-10-CM | POA: Diagnosis not present

## 2023-04-30 DIAGNOSIS — R269 Unspecified abnormalities of gait and mobility: Secondary | ICD-10-CM | POA: Diagnosis not present

## 2023-05-02 ENCOUNTER — Telehealth: Payer: Self-pay | Admitting: Sports Medicine

## 2023-05-02 DIAGNOSIS — R269 Unspecified abnormalities of gait and mobility: Secondary | ICD-10-CM | POA: Diagnosis not present

## 2023-05-02 DIAGNOSIS — E119 Type 2 diabetes mellitus without complications: Secondary | ICD-10-CM

## 2023-05-02 DIAGNOSIS — M6281 Muscle weakness (generalized): Secondary | ICD-10-CM | POA: Diagnosis not present

## 2023-05-02 DIAGNOSIS — M25572 Pain in left ankle and joints of left foot: Secondary | ICD-10-CM | POA: Diagnosis not present

## 2023-05-02 NOTE — Telephone Encounter (Signed)
Patient called requesting a refill of Dulaglutide (TRULICITY) 3 MG/0.5ML SOPN [[id number]] and metFORMIN (GLUCOPHAGE) 1000 MG tablet [[id number]]   Patient is out of the Trulicity and only has a few left of the metFORMIN  Pharmacy -  [id number] [place], [place] - [address]

## 2023-05-03 MED ORDER — TRULICITY 3 MG/0.5ML ~~LOC~~ SOAJ: 3.0000 mg | SUBCUTANEOUS | 11 refills | Status: DC

## 2023-05-03 MED ORDER — METFORMIN HCL 1000 MG PO TABS: 1000.0000 mg | ORAL_TABLET | Freq: Two times a day (BID) | ORAL | 2 refills | Status: DC

## 2023-05-03 NOTE — Telephone Encounter (Signed)
Refill sent.

## 2023-05-03 NOTE — Telephone Encounter (Signed)
Patient called requesting an update on the refill.   Patient is now out of the Trulicity and only has one metFORMIN left.

## 2023-05-09 ENCOUNTER — Ambulatory Visit (INDEPENDENT_AMBULATORY_CARE_PROVIDER_SITE_OTHER): Payer: Medicare Other | Admitting: Sports Medicine

## 2023-05-09 DIAGNOSIS — Z7984 Long term (current) use of oral hypoglycemic drugs: Secondary | ICD-10-CM

## 2023-05-09 DIAGNOSIS — M6788 Other specified disorders of synovium and tendon, other site: Secondary | ICD-10-CM | POA: Diagnosis not present

## 2023-05-09 DIAGNOSIS — E119 Type 2 diabetes mellitus without complications: Secondary | ICD-10-CM | POA: Diagnosis not present

## 2023-05-09 LAB — POCT GLYCOSYLATED HEMOGLOBIN (HGB A1C): Hemoglobin A1C: 9 % — AB (ref 4.0–5.6)

## 2023-05-09 MED ORDER — GLIPIZIDE 10 MG PO TABS: 10.0000 mg | ORAL_TABLET | Freq: Two times a day (BID) | ORAL | 11 refills | Status: DC

## 2023-05-09 MED ORDER — TRULICITY 4.5 MG/0.5ML ~~LOC~~ SOAJ: 4.5000 mg | SUBCUTANEOUS | 11 refills | Status: DC

## 2023-05-09 NOTE — Progress Notes (Signed)
    Procedures performed today:    None.  Independent interpretation of notes and tests performed by another provider:   None.  Brief History, Exam, Impression, and Recommendations:    Diabetes mellitus type 2, controlled, without complications (HCC) A1c continues to be uncontrolled, increasing Trulicity to 4.5 mg. I do think we should restart his glipizide at this point. 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  Achilles tendinosis of left lower extremity Improved considerably with physical therapy, topical nitroglycerin, heel lifts, Celebrex. Continue eccentric conditioning and return to see me as needed for this.  I spent 40 minutes of total time managing this patient today, this includes chart review, face to face, and non-face to face time.  ____________________________________________ Ihor Austin. Benjamin Stain, M.D., ABFM., CAQSM., AME. Primary Care and Sports Medicine [facility]  Adjunct Professor of Family Medicine  [place] [employer] [facility] of Medicine  Restaurant manager, fast food

## 2023-05-09 NOTE — Assessment & Plan Note (Signed)
Improved considerably with physical therapy, topical nitroglycerin, heel lifts, Celebrex. Continue eccentric conditioning and return to see me as needed for this.

## 2023-05-09 NOTE — Assessment & Plan Note (Signed)
A1c continues to be uncontrolled, increasing Trulicity to 4.5 mg. I do think we should restart his glipizide at this point. 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

## 2023-05-29 ENCOUNTER — Telehealth: Payer: Self-pay | Admitting: Sports Medicine

## 2023-05-29 DIAGNOSIS — E119 Type 2 diabetes mellitus without complications: Secondary | ICD-10-CM

## 2023-05-29 NOTE — Telephone Encounter (Signed)
Unfortunately there are not any good alternatives short of switching to an entirely new medication.  My advice to him would be to call around and see if Ozempic 2 mg, Bydureon, Victoza, Greggory Keen are available anywhere?

## 2023-05-29 NOTE — Telephone Encounter (Signed)
Patient called about his Trulicity 4.5mg  is not available anywhere is there any alternatives ? Please advise

## 2023-06-04 ENCOUNTER — Other Ambulatory Visit: Payer: Self-pay | Admitting: Sports Medicine

## 2023-06-04 DIAGNOSIS — E119 Type 2 diabetes mellitus without complications: Secondary | ICD-10-CM

## 2023-06-04 MED ORDER — OZEMPIC (2 MG/DOSE) 8 MG/3ML ~~LOC~~ SOPN: 2.0000 mg | PEN_INJECTOR | SUBCUTANEOUS | 11 refills | Status: DC

## 2023-06-04 NOTE — Telephone Encounter (Signed)
Sent!

## 2023-06-04 NOTE — Addendum Note (Signed)
Addended by: Monica Becton on: 06/04/2023 12:48 PM   Modules accepted: Orders

## 2023-06-04 NOTE — Telephone Encounter (Signed)
Patient returned call to office, [facility]. Patient is requesting Rx be sent to  [id number] - [place], [place] - [address] Phone: [phone]  Fax: [phone]

## 2023-06-05 ENCOUNTER — Other Ambulatory Visit: Payer: Self-pay | Admitting: Sports Medicine

## 2023-06-05 DIAGNOSIS — E119 Type 2 diabetes mellitus without complications: Secondary | ICD-10-CM

## 2023-06-06 ENCOUNTER — Telehealth: Payer: Self-pay | Admitting: Sports Medicine

## 2023-06-06 DIAGNOSIS — E119 Type 2 diabetes mellitus without complications: Secondary | ICD-10-CM

## 2023-06-06 MED ORDER — BYDUREON BCISE 2 MG/0.85ML ~~LOC~~ AUIJ: 2.0000 mg | AUTO-INJECTOR | SUBCUTANEOUS | 11 refills | Status: DC

## 2023-06-06 NOTE — Telephone Encounter (Signed)
Sent!

## 2023-06-06 NOTE — Telephone Encounter (Signed)
Patient called he is asking if a rx for Bydureon Bicise 2.0mg /0.85mg  [facility]                             [address]                              [phone]

## 2023-08-09 ENCOUNTER — Ambulatory Visit (INDEPENDENT_AMBULATORY_CARE_PROVIDER_SITE_OTHER): Payer: Medicare Other | Admitting: Sports Medicine

## 2023-08-09 ENCOUNTER — Encounter: Payer: Self-pay | Admitting: Sports Medicine

## 2023-08-09 VITALS — BP 127/71 | HR 61 | Ht 72.0 in | Wt 247.0 lb

## 2023-08-09 DIAGNOSIS — Z7984 Long term (current) use of oral hypoglycemic drugs: Secondary | ICD-10-CM

## 2023-08-09 DIAGNOSIS — E119 Type 2 diabetes mellitus without complications: Secondary | ICD-10-CM | POA: Diagnosis not present

## 2023-08-09 LAB — POCT GLYCOSYLATED HEMOGLOBIN (HGB A1C): Hemoglobin A1C: 7.9 % — AB (ref 4.0–5.6)

## 2023-08-09 MED ORDER — TETANUS-DIPHTH-ACELL PERTUSSIS 5-2.5-18.5 LF-MCG/0.5 IM SUSY: 0.5000 mL | PREFILLED_SYRINGE | Freq: Once | INTRAMUSCULAR | 0 refills | Status: AC

## 2023-08-09 NOTE — Progress Notes (Signed)
    Procedures performed today:    None.  Independent interpretation of notes and tests performed by another provider:   None.  Brief History, Exam, Impression, and Recommendations:    Diabetes mellitus type 2, controlled, without complications (HCC) Bill returns, he had some difficulty getting his Trulicity 4.5, he did do Bydureon, more recently he has been able to restart Trulicity 4.5 and it is in stock, his A1c today is 7.9% but as Trulicity 4.5 builds up it will improve dramatically, return in 3 months for repeat A1c. Continue metformin and glipizide.    ____________________________________________ Ihor Austin. Benjamin Stain, M.D., ABFM., CAQSM., AME. Primary Care and Sports Medicine [facility]  Adjunct Professor of Family Medicine  [place] [employer] [facility] of Medicine  Restaurant manager, fast food

## 2023-08-09 NOTE — Assessment & Plan Note (Signed)
William Webster returns, he had some difficulty getting his Trulicity 4.5, he did do Bydureon, more recently he has been able to restart Trulicity 4.5 and it is in stock, his A1c today is 7.9% but as Trulicity 4.5 builds up it will improve dramatically, return in 3 months for repeat A1c. Continue metformin and glipizide.

## 2023-08-10 ENCOUNTER — Other Ambulatory Visit: Payer: Self-pay | Admitting: Sports Medicine

## 2023-08-10 DIAGNOSIS — E221 Hyperprolactinemia: Secondary | ICD-10-CM

## 2023-09-05 DIAGNOSIS — Z23 Encounter for immunization: Secondary | ICD-10-CM | POA: Diagnosis not present

## 2023-09-26 ENCOUNTER — Other Ambulatory Visit: Payer: Self-pay | Admitting: Sports Medicine

## 2023-10-25 ENCOUNTER — Other Ambulatory Visit: Payer: Self-pay | Admitting: Sports Medicine

## 2023-10-25 DIAGNOSIS — E119 Type 2 diabetes mellitus without complications: Secondary | ICD-10-CM

## 2023-11-09 ENCOUNTER — Encounter: Payer: Self-pay | Admitting: Sports Medicine

## 2023-11-09 ENCOUNTER — Ambulatory Visit (INDEPENDENT_AMBULATORY_CARE_PROVIDER_SITE_OTHER): Payer: Medicare Other | Admitting: Sports Medicine

## 2023-11-09 VITALS — BP 134/81 | HR 58 | Ht 72.0 in | Wt 237.0 lb

## 2023-11-09 DIAGNOSIS — Z7984 Long term (current) use of oral hypoglycemic drugs: Secondary | ICD-10-CM | POA: Diagnosis not present

## 2023-11-09 DIAGNOSIS — E119 Type 2 diabetes mellitus without complications: Secondary | ICD-10-CM | POA: Diagnosis not present

## 2023-11-09 DIAGNOSIS — N139 Obstructive and reflux uropathy, unspecified: Secondary | ICD-10-CM | POA: Diagnosis not present

## 2023-11-09 DIAGNOSIS — D352 Benign neoplasm of pituitary gland: Secondary | ICD-10-CM | POA: Diagnosis not present

## 2023-11-09 DIAGNOSIS — Z0000 Encounter for general adult medical examination without abnormal findings: Secondary | ICD-10-CM | POA: Insufficient documentation

## 2023-11-09 LAB — POCT GLYCOSYLATED HEMOGLOBIN (HGB A1C): Hemoglobin A1C: 7.2 % — AB (ref 4.0–5.6)

## 2023-11-09 MED ORDER — RSV PRE-FUSION F A&B VAC RCMB 120 MCG/0.5ML IM SOLR: 0.5000 mL | Freq: Once | INTRAMUSCULAR | 0 refills | Status: AC

## 2023-11-09 NOTE — Progress Notes (Signed)
    Procedures performed today:    None.  Independent interpretation of notes and tests performed by another provider:   None.  Brief History, Exam, Impression, and Recommendations:    Diabetes mellitus type 2, controlled, without complications (HCC) A1c from 7.9% down to 7.2%, well-controlled, checking additional screening labs. Return to see me in 6 months.  Annual physical exam Up-to-date on screening measures, he does need diabetic eye, ordering this now. Checking routine labs today.  I spent 30 minutes of total time managing this patient today, this includes chart review, face to face, and non-face to face time.  ____________________________________________ Ihor Austin. Benjamin Stain, M.D., ABFM., CAQSM., AME. Primary Care and Sports Medicine [place] [facility]  Adjunct Professor of Family Medicine  [employer] [facility] of Medicine  Restaurant manager, fast food

## 2023-11-09 NOTE — Assessment & Plan Note (Signed)
Up-to-date on screening measures, he does need diabetic eye, ordering this now. Checking routine labs today.

## 2023-11-09 NOTE — Assessment & Plan Note (Signed)
A1c from 7.9% down to 7.2%, well-controlled, checking additional screening labs. Return to see me in 6 months.

## 2023-11-10 LAB — CBC
Hematocrit: 47.9 % (ref 37.5–51.0)
Hemoglobin: 15.6 g/dL (ref 13.0–17.7)
MCH: 29 pg (ref 26.6–33.0)
MCHC: 32.6 g/dL (ref 31.5–35.7)
MCV: 89 fL (ref 79–97)
Platelets: 127 10*3/uL — ABNORMAL LOW (ref 150–450)
RBC: 5.38 x10E6/uL (ref 4.14–5.80)
RDW: 13.4 % (ref 11.6–15.4)
WBC: 7 10*3/uL (ref 3.4–10.8)

## 2023-11-10 LAB — COMPREHENSIVE METABOLIC PANEL
ALT: 24 [IU]/L (ref 0–44)
AST: 25 [IU]/L (ref 0–40)
Albumin: 4.7 g/dL (ref 3.9–4.9)
Alkaline Phosphatase: 60 [IU]/L (ref 44–121)
BUN/Creatinine Ratio: 11 (ref 10–24)
BUN: 12 mg/dL (ref 8–27)
Bilirubin Total: 2.1 mg/dL — ABNORMAL HIGH (ref 0.0–1.2)
CO2: 24 mmol/L (ref 20–29)
Calcium: 9.7 mg/dL (ref 8.6–10.2)
Chloride: 102 mmol/L (ref 96–106)
Creatinine, Ser: 1.05 mg/dL (ref 0.76–1.27)
Globulin, Total: 2.2 g/dL (ref 1.5–4.5)
Glucose: 157 mg/dL — ABNORMAL HIGH (ref 70–99)
Potassium: 5.2 mmol/L (ref 3.5–5.2)
Sodium: 140 mmol/L (ref 134–144)
Total Protein: 6.9 g/dL (ref 6.0–8.5)
eGFR: 76 mL/min/{1.73_m2} (ref >59)

## 2023-11-10 LAB — MICROALBUMIN / CREATININE URINE RATIO
Creatinine, Urine: 177 mg/dL
Microalb/Creat Ratio: 24 mg/g{creat} (ref 0–29)
Microalbumin, Urine: 42.1 ug/mL

## 2023-11-10 LAB — PSA, TOTAL AND FREE
PSA, Free Pct: 40 %
PSA, Free: 1.32 ng/mL
Prostate Specific Ag, Serum: 3.3 ng/mL (ref 0.0–4.0)

## 2023-11-10 LAB — LIPID PANEL
Chol/HDL Ratio: 2.3 ratio (ref 0.0–5.0)
Cholesterol, Total: 131 mg/dL (ref 100–199)
HDL: 58 mg/dL (ref >39)
LDL Chol Calc (NIH): 45 mg/dL (ref 0–99)
Triglycerides: 173 mg/dL — ABNORMAL HIGH (ref 0–149)
VLDL Cholesterol Cal: 28 mg/dL (ref 5–40)

## 2023-11-10 LAB — PROLACTIN: Prolactin: 15.3 ng/mL (ref 3.6–25.2)

## 2023-11-10 LAB — TSH: TSH: 1.65 u[IU]/mL (ref 0.450–4.500)

## 2023-12-10 ENCOUNTER — Encounter: Payer: Self-pay | Admitting: Sports Medicine

## 2023-12-10 NOTE — Telephone Encounter (Signed)
 Care team updated and letter sent for eye exam notes.

## 2023-12-16 ENCOUNTER — Other Ambulatory Visit: Payer: Self-pay | Admitting: Sports Medicine

## 2023-12-16 DIAGNOSIS — E119 Type 2 diabetes mellitus without complications: Secondary | ICD-10-CM

## 2024-01-20 ENCOUNTER — Other Ambulatory Visit: Payer: Self-pay | Admitting: Sports Medicine

## 2024-01-20 DIAGNOSIS — E119 Type 2 diabetes mellitus without complications: Secondary | ICD-10-CM

## 2024-02-22 ENCOUNTER — Other Ambulatory Visit: Payer: Self-pay | Admitting: Sports Medicine

## 2024-02-22 DIAGNOSIS — E119 Type 2 diabetes mellitus without complications: Secondary | ICD-10-CM

## 2024-04-15 ENCOUNTER — Ambulatory Visit (INDEPENDENT_AMBULATORY_CARE_PROVIDER_SITE_OTHER): Payer: Medicare (Managed Care)

## 2024-04-15 ENCOUNTER — Other Ambulatory Visit: Payer: Self-pay | Admitting: Sports Medicine

## 2024-04-15 VITALS — Ht 72.0 in | Wt 236.0 lb

## 2024-04-15 DIAGNOSIS — E221 Hyperprolactinemia: Secondary | ICD-10-CM

## 2024-04-15 DIAGNOSIS — Z0000 Encounter for general adult medical examination without abnormal findings: Secondary | ICD-10-CM | POA: Diagnosis not present

## 2024-04-15 NOTE — Patient Instructions (Signed)
  Mr. William Webster , Thank you for taking time to come for your Medicare Wellness Visit. I appreciate your ongoing commitment to your health goals. Please review the following plan we discussed and let me know if I can assist you in the future.   These are the goals we discussed:  Goals       Patient Stated (pt-stated)      Patient would like to loose 15 lbs.       Patient Stated (pt-stated)      04/11/2023 AWV Goal: Diabetes Management  Patient will maintain an A1C level below 8.0 Patient will not develop any diabetic foot complications Patient will not experience any hypoglycemic episodes over the next 3 months Patient will notify our office of any CBG readings outside of the provider recommended range by calling [phone] Patient will adhere to provider recommendations for diabetes management  Patient Self Management Activities take all medications as prescribed and report any negative side effects monitor and record blood sugar readings as directed adhere to a low carbohydrate diet that incorporates lean proteins, vegetables, whole grains, low glycemic fruits check feet daily noting any sores, cracks, injuries, or callous formations see PCP or podiatrist if he notices any changes in his legs, feet, or toenails Patient will visit PCP and have an A1C level checked every 3 to 6 months as directed  have a yearly eye exam to monitor for vascular changes associated with diabetes and will request that the report be sent to his pcp.  consult with his PCP regarding any changes in his health or new or worsening symptoms       Patient Stated      Patient stated he would like to have a HgbA1c below 7.0 and lose weight.         This is a list of the screening recommended for you and due dates:  Health Maintenance  Topic Date Due   Eye exam for diabetics  06/11/2021   Pneumonia Vaccine (2 of 2 - PCV) 01/27/2022   Complete foot exam   01/27/2023   COVID-19 Vaccine (5 - 2024-25 season)  08/26/2023   Hemoglobin A1C  05/08/2024   Flu Shot  07/25/2024   Yearly kidney function blood test for diabetes  11/08/2024   Yearly kidney health urinalysis for diabetes  11/08/2024   Medicare Annual Wellness Visit  04/15/2025   Colon Cancer Screening  07/28/2027   DTaP/Tdap/Td vaccine (3 - Td or Tdap) 08/08/2033   Hepatitis C Screening  Completed   Zoster (Shingles) Vaccine  Completed   HPV Vaccine  Aged Out   Meningitis B Vaccine  Aged Out   Cologuard (Stool DNA test)  Discontinued

## 2024-04-15 NOTE — Progress Notes (Signed)
 Subjective:   William Webster is a 73 y.o. male who presents for Medicare Annual/Subsequent preventive examination.  Visit Complete: Virtual I connected with  William Webster on 04/15/24 by a audio enabled telemedicine application and verified that I am speaking with the correct person using two identifiers.  Patient Location: Home  Provider Location: Office/Clinic  I discussed the limitations of evaluation and management by telemedicine. The patient expressed understanding and agreed to proceed.  Vital Signs: Because this visit was a virtual/telehealth visit, some criteria may be missing or patient reported. Any vitals not documented were not able to be obtained and vitals that have been documented are patient reported.  Patient Medicare AWV questionnaire was completed by the patient on 04/11/2024; I have confirmed that all information answered by patient is correct and no changes since this date.  Cardiac Risk Factors include: advanced age (>53men, >44 women);smoking/ tobacco exposure;male gender;hypertension;diabetes mellitus;dyslipidemia;obesity (BMI >30kg/m2)     Objective:    Today's Vitals   04/15/24 1547  Weight: 236 lb (107 kg)  Height: 6' (1.829 m)   Body mass index is 32.01 kg/m.     04/15/2024    4:11 PM 04/11/2023    2:13 PM 03/27/2022    2:21 PM 03/08/2022    9:05 AM 02/13/2022   11:49 AM 02/10/2022    1:44 PM 11/24/2015    7:22 AM  Advanced Directives  Does Patient Have a Medical Advance Directive? No No No No No No No  Would patient like information on creating a medical advance directive? No - Patient declined No - Patient declined No - Patient declined No - Patient declined No - Patient declined No - Patient declined     Current Medications (verified) Outpatient Encounter Medications as of 04/15/2024  Medication Sig   acetaminophen  (TYLENOL ) 650 MG CR tablet Take 1 tablet (650 mg total) by mouth every 8 (eight) hours as needed for pain.   cabergoline   (DOSTINEX ) 0.5 MG tablet TAKE 1 TABLET (0.5 MG TOTAL) BY MOUTH 2 (TWO) TIMES A WEEK. FRIDAYS & MONDAYS   glipiZIDE  (GLUCOTROL ) 10 MG tablet Take 1 tablet (10 mg total) by mouth 2 (two) times daily before a meal.   lisinopril  (ZESTRIL ) 2.5 MG tablet TAKE 1 TABLET BY MOUTH EVERY DAY   metFORMIN  (GLUCOPHAGE ) 1000 MG tablet TAKE 1 TABLET (1,000 MG TOTAL) BY MOUTH TWICE A DAY WITH FOOD   Misc Natural Products (COGNIUM FOCUS PO) Take by mouth.   Multiple Vitamins-Minerals (ADULT ONE DAILY GUMMIES PO) Take 2 tablets by mouth in the morning.   rosuvastatin  (CRESTOR ) 40 MG tablet TAKE 1 TABLET (40 MG TOTAL) BY MOUTH DAILY. NEED LABS   TRULICITY  4.5 MG/0.5ML SOPN 4.5 mg.   Wheat Dextrin (BENEFIBER PO) Take 1 Dose by mouth in the morning.   [DISCONTINUED] amoxicillin -clavulanate (AUGMENTIN ) 875-125 MG tablet Take 1 tablet by mouth every 12 (twelve) hours.   No facility-administered encounter medications on file as of 04/15/2024.    Allergies (verified) Patient has no known allergies.   History: Past Medical History:  Diagnosis Date   Anterior pituitary adenoma syndrome (HCC)    Bimalleolar fracture of right ankle    Cancer (HCC) 01/2022   Right neck melanoma   Colon polyp    Diabetes mellitus without complication (HCC)    Diabetic neuropathy (HCC)    Family history of cancer of GI tract    Family history of colon cancer    GERD (gastroesophageal reflux disease)    Oletta Berry disease  Hyperlipidemia    Low back pain    Lumbar scoliosis    Prolactinoma (HCC)    Sciatica of right side    Spondylosis of lumbar joint    Past Surgical History:  Procedure Laterality Date   COLONOSCOPY  2012   [place]   ESOPHAGOGASTRODUODENOSCOPY     [facility] he had a bad case of reflux-said it been a few years. Maybe about 10 years or 11 years    FRACTURE SURGERY  2018   HEMORRHOID SURGERY     MASS EXCISION Right 03/08/2022   Procedure: REEXCISION RIGHT NECK MELANOMA;  Surgeon: Lockie Rima, MD;   Location: [facility] OR;  Service: General;  Laterality: Right;   MELANOMA EXCISION Right 02/13/2022   Procedure: WIDE LOCAL EXCISION WITH ADVANCE FLAP CLOSURE FOR MELANOMA RIGHT NECK;  Surgeon: Lockie Rima, MD;  Location: [facility];  Service: General;  Laterality: Right;   ORIF ANKLE FRACTURE Right 11/24/2015   Procedure: OPEN REDUCTION INTERNAL FIXATION (ORIF) ANKLE FRACTURE;  Surgeon: Neil Balls, MD;  Location: [facility];  Service: Orthopedics;  Laterality: Right;  Open reduction internal fixation right ankle fracture   POLYPECTOMY     TONSILLECTOMY     UPPER GASTROINTESTINAL ENDOSCOPY     Family History  Problem Relation Age of Onset   Colon polyps Mother    Heart failure Mother    Diabetes Mother    Hyperlipidemia Mother    Stroke Mother    Colon cancer Mother        EARLY 69'S   Arthritis Mother    Cancer Mother    Heart disease Mother    Stroke Father    Hyperlipidemia Father    Cancer Father    Diabetes Maternal Grandfather    Heart disease Maternal Grandfather    Stroke Maternal Grandfather    Diabetes Paternal Grandfather    Heart disease Paternal Grandfather    Stroke Paternal Grandfather    Esophageal cancer Neg Hx    Stomach cancer Neg Hx    Rectal cancer Neg Hx    Social History   Socioeconomic History   Marital status: Married    Spouse name: [name]   Number of children: 2   Years of education: 12th grade   Highest education level: 12th grade  Occupational History    Comment: Part-time   Occupation: Retired  Tobacco Use   Smoking status: Former    Types: Cigars    Quit date: 03/2020    Years since quitting: 4.0    Passive exposure: Past   Smokeless tobacco: Never   Tobacco comments:    occassional cigar-every month stopped several years ago (03/2020)  Vaping Use   Vaping status: Never Used  Substance and Sexual Activity   Alcohol use: Not Currently    Comment: socially   Drug use: No   Sexual activity: Yes    Birth  control/protection: None  Other Topics Concern   Not on file  Social History Narrative   Retired. Lives with wife. He has two daughters (one lives in [place] and one in [place] ). Enjoys going to the car shows.    Social Drivers of Corporate investment banker Strain: Low Risk  (04/15/2024)   Overall Financial Resource Strain (CARDIA)    Difficulty of Paying Living Expenses: Not hard at all  Food Insecurity: No Food Insecurity (04/15/2024)   Hunger Vital Sign    Worried About Running Out of Food in the Last Year: Never true  Ran Out of Food in the Last Year: Never true  Transportation Needs: No Transportation Needs (04/15/2024)   PRAPARE - Administrator, Civil Service (Medical): No    Lack of Transportation (Non-Medical): No  Physical Activity: Insufficiently Active (04/15/2024)   Exercise Vital Sign    Days of Exercise per Week: 7 days    Minutes of Exercise per Session: 20 min  Stress: No Stress Concern Present (04/15/2024)   [employer] of Occupational Health - Occupational Stress Questionnaire    Feeling of Stress : Not at all  Social Connections: Moderately Integrated (04/15/2024)   Social Connection and Isolation Panel [NHANES]    Frequency of Communication with Friends and Family: More than three times a week    Frequency of Social Gatherings with Friends and Family: More than three times a week    Attends Religious Services: Never    Database administrator or Organizations: Yes    Attends Banker Meetings: Never    Marital Status: Married    Tobacco Counseling Counseling given: Not Answered Tobacco comments: occassional cigar-every month stopped several years ago (03/2020)   Clinical Intake:  Pre-visit preparation completed: Yes  Pain : No/denies pain     BMI - recorded: 32.01 Nutritional Status: BMI > 30  Obese Nutritional Risks: None Diabetes: Yes CBG done?: No Did pt. bring in CBG monitor from home?: No  How often do  you need to have someone help you when you read instructions, pamphlets, or other written materials from your doctor or pharmacy?: 1 - Never What is the last grade level you completed in school?: 12  Interpreter Needed?: No      Activities of Daily Living    04/15/2024    3:56 PM 04/11/2024   11:56 AM  In your present state of health, do you have any difficulty performing the following activities:  Hearing? 0 0  Vision? 0 0  Difficulty concentrating or making decisions? 0 0  Walking or climbing stairs? 0 0  Dressing or bathing? 0 0  Doing errands, shopping? 0 0  Preparing Food and eating ? N N  Using the Toilet? N N  In the past six months, have you accidently leaked urine? N N  Do you have problems with loss of bowel control? N N  Managing your Medications? N N  Managing your Finances? N N  Housekeeping or managing your Housekeeping? N N    Patient Care Team: Gean Keels, MD as PCP - General (Family Medicine) [facility] for [facility] any recent Medical Services you may have received from other than [facility] providers in the past year (date may be approximate).     Assessment:   This is a routine wellness examination for [place].  Hearing/Vision screen No results found.   Goals Addressed             This Visit's Progress    Patient Stated       Patient stated he would like to have a HgbA1c below 7.0 and lose weight.       Depression Screen    11/09/2023    8:35 AM 04/11/2023    2:04 PM 02/08/2023    8:38 AM 03/27/2022    2:24 PM 01/27/2022    8:33 AM 04/21/2020    8:15 AM 10/16/2019    8:47 AM  PHQ 2/9 Scores  PHQ - 2 Score 0 0 0 0 0 0 0  PHQ- 9 Score  0     Fall Risk    04/15/2024    4:12 PM 04/11/2024   11:56 AM 11/09/2023    8:35 AM 04/11/2023    2:13 PM 04/07/2023   11:40 AM  Fall Risk   Falls in the past year? 1 0 0 1 1  Number falls in past yr: 0  0 0 0  Injury with Fall? 0  0 0 0  Risk for fall due to : No Fall Risks    Orthopedic patient   Follow up Falls evaluation completed  Falls evaluation completed Falls evaluation completed;Education provided;Falls prevention discussed     MEDICARE RISK AT HOME: Medicare Risk at Home Any stairs in or around the home?: No Home free of loose throw rugs in walkways, pet beds, electrical cords, etc?: Yes Adequate lighting in your home to reduce risk of falls?: Yes Life alert?: No Use of a cane, walker or w/c?: No Grab bars in the bathroom?: No Shower chair or bench in shower?: No Elevated toilet seat or a handicapped toilet?: No  TIMED UP AND GO:  Was the test performed?  No    Cognitive Function:        04/15/2024    4:13 PM 04/11/2023    2:15 PM 03/27/2022    2:33 PM  6CIT Screen  What Year? 0 points 0 points 0 points  What month? 0 points 0 points 0 points  What time? 0 points 0 points 0 points  Count back from 20 0 points 0 points 0 points  Months in reverse 0 points 0 points 0 points  Repeat phrase 0 points 0 points 2 points  Total Score 0 points 0 points 2 points    Immunizations Immunization History  Administered Date(s) Administered   Fluad Quad(high Dose 65+) 08/27/2019   Influenza, High Dose Seasonal PF 08/13/2020, 09/05/2023   Influenza,inj,Quad PF,6+ Mos 08/23/2015, 09/16/2018, 09/02/2021   Influenza-Unspecified 09/07/2016, 10/08/2017   PFIZER(Purple Top)SARS-COV-2 Vaccination 03/01/2020, 03/22/2020, 10/14/2020, 03/08/2021   Pneumococcal Polysaccharide-23 01/12/2016, 01/27/2021   Respiratory Syncytial Virus Vaccine,Recomb Aduvanted(Arexvy) 11/13/2023   Tdap 03/17/2013, 08/09/2023   Varicella 10/13/2021   Zoster Recombinant(Shingrix) 01/27/2021, 10/13/2021   Zoster, Live 08/23/2015    TDAP status: Up to date  Flu Vaccine status: Up to date  Pneumococcal vaccine status: Up to date  Covid-19 vaccine status: Information provided on how to obtain vaccines.   Qualifies for Shingles Vaccine? Yes   Zostavax completed Yes   Shingrix  Completed?: Yes  Screening Tests Health Maintenance  Topic Date Due   OPHTHALMOLOGY EXAM  06/11/2021   Pneumonia Vaccine 31+ Years old (2 of 2 - PCV) 01/27/2022   FOOT EXAM  01/27/2023   COVID-19 Vaccine (5 - 2024-25 season) 08/26/2023   HEMOGLOBIN A1C  05/08/2024   INFLUENZA VACCINE  07/25/2024   Diabetic kidney evaluation - eGFR measurement  11/08/2024   Diabetic kidney evaluation - Urine ACR  11/08/2024   Medicare Annual Wellness (AWV)  04/15/2025   Colonoscopy  07/28/2027   DTaP/Tdap/Td (3 - Td or Tdap) 08/08/2033   Hepatitis C Screening  Completed   Zoster Vaccines- Shingrix  Completed   HPV VACCINES  Aged Out   Meningococcal B Vaccine  Aged Out   Fecal DNA (Cologuard)  Discontinued    Health Maintenance  Health Maintenance Due  Topic Date Due   OPHTHALMOLOGY EXAM  06/11/2021   Pneumonia Vaccine 27+ Years old (2 of 2 - PCV) 01/27/2022   FOOT EXAM  01/27/2023   COVID-19  Vaccine (5 - 2024-25 season) 08/26/2023    Colorectal cancer screening: Type of screening: Colonoscopy. Completed 07/27/2022. Repeat every 5 years  Lung Cancer Screening: (Low Dose CT Chest recommended if Age 68-80 years, 20 pack-year currently smoking OR have quit w/in 15years.) does not qualify.   Lung Cancer Screening Referral: n/a  Additional Screening:  Hepatitis C Screening: does qualify; Completed 08/16/2015  Vision Screening: Recommended annual ophthalmology exams for early detection of glaucoma and other disorders of the eye. Is the patient up to date with their annual eye exam?  Yes  Who is the provider or what is the name of the office in which the patient attends annual eye exams? Dr Reyne Cave If pt is not established with a provider, would they like to be referred to a provider to establish care?  N/a .   Dental Screening: Recommended annual dental exams for proper oral hygiene  Diabetic Foot Exam: Diabetic Foot Exam: Completed 01/27/2022  Community Resource Referral / Chronic Care  Management: CRR required this visit?  No   CCM required this visit?  No     Plan:     I have personally reviewed and noted the following in the patient's chart:   Medical and social history Use of alcohol, tobacco or illicit drugs  Current medications and supplements including opioid prescriptions. Patient is not currently taking opioid prescriptions. Functional ability and status Nutritional status Physical activity Advanced directives List of other physicians Hospitalizations, surgeries, and ER visits in previous 12 months. None Vitals Screenings to include cognitive, depression, and falls Referrals and appointments  In addition, I have reviewed and discussed with patient certain preventive protocols, quality metrics, and best practice recommendations. A written personalized care plan for preventive services as well as general preventive health recommendations were provided to patient.     Aubrey Leaf, [place]   04/15/2024   After Visit Summary: (MyChart) Due to this being a telephonic visit, the after visit summary with patients personalized plan was offered to patient via MyChart   Nurse Notes:    Cordaryl Decelles is a 73 y.o. male patient of Thekkekandam, Joselyn Nicely, MD who had a Medicare Annual Wellness Visit today via telephone. Nyles is Retired and lives with their spouse. He has 2 children. He reports that he is socially active and does interact with friends/family regularly. He is moderately physically active and enjoys going to the car shows.

## 2024-04-17 ENCOUNTER — Other Ambulatory Visit: Payer: Self-pay | Admitting: Sports Medicine

## 2024-04-17 DIAGNOSIS — E119 Type 2 diabetes mellitus without complications: Secondary | ICD-10-CM

## 2024-05-02 DIAGNOSIS — N132 Hydronephrosis with renal and ureteral calculous obstruction: Secondary | ICD-10-CM | POA: Diagnosis not present

## 2024-05-02 DIAGNOSIS — N201 Calculus of ureter: Secondary | ICD-10-CM | POA: Diagnosis not present

## 2024-05-03 DIAGNOSIS — Z79899 Other long term (current) drug therapy: Secondary | ICD-10-CM | POA: Diagnosis not present

## 2024-05-03 DIAGNOSIS — E785 Hyperlipidemia, unspecified: Secondary | ICD-10-CM | POA: Diagnosis not present

## 2024-05-03 DIAGNOSIS — N1339 Other hydronephrosis: Secondary | ICD-10-CM | POA: Diagnosis not present

## 2024-05-03 DIAGNOSIS — N132 Hydronephrosis with renal and ureteral calculous obstruction: Secondary | ICD-10-CM | POA: Diagnosis not present

## 2024-05-03 DIAGNOSIS — E1122 Type 2 diabetes mellitus with diabetic chronic kidney disease: Secondary | ICD-10-CM | POA: Diagnosis not present

## 2024-05-03 DIAGNOSIS — I129 Hypertensive chronic kidney disease with stage 1 through stage 4 chronic kidney disease, or unspecified chronic kidney disease: Secondary | ICD-10-CM | POA: Diagnosis not present

## 2024-05-03 DIAGNOSIS — N189 Chronic kidney disease, unspecified: Secondary | ICD-10-CM | POA: Diagnosis not present

## 2024-05-03 DIAGNOSIS — N201 Calculus of ureter: Secondary | ICD-10-CM | POA: Diagnosis not present

## 2024-05-03 DIAGNOSIS — Z7984 Long term (current) use of oral hypoglycemic drugs: Secondary | ICD-10-CM | POA: Diagnosis not present

## 2024-05-03 DIAGNOSIS — N138 Other obstructive and reflux uropathy: Secondary | ICD-10-CM | POA: Diagnosis not present

## 2024-05-08 ENCOUNTER — Ambulatory Visit (INDEPENDENT_AMBULATORY_CARE_PROVIDER_SITE_OTHER): Payer: Medicare Other | Admitting: Sports Medicine

## 2024-05-08 ENCOUNTER — Telehealth: Payer: Self-pay

## 2024-05-08 VITALS — BP 120/75 | HR 58 | Temp 97.9°F | Wt 241.0 lb

## 2024-05-08 DIAGNOSIS — N529 Male erectile dysfunction, unspecified: Secondary | ICD-10-CM | POA: Diagnosis not present

## 2024-05-08 DIAGNOSIS — N200 Calculus of kidney: Secondary | ICD-10-CM | POA: Insufficient documentation

## 2024-05-08 DIAGNOSIS — Z7984 Long term (current) use of oral hypoglycemic drugs: Secondary | ICD-10-CM

## 2024-05-08 DIAGNOSIS — E119 Type 2 diabetes mellitus without complications: Secondary | ICD-10-CM | POA: Diagnosis not present

## 2024-05-08 LAB — POCT GLYCOSYLATED HEMOGLOBIN (HGB A1C): HbA1c, POC (controlled diabetic range): 7.2 % — AB (ref 0.0–7.0)

## 2024-05-08 MED ORDER — TADALAFIL 5 MG PO TABS: 5.0000 mg | ORAL_TABLET | Freq: Every day | ORAL | 11 refills | Status: DC

## 2024-05-08 NOTE — Assessment & Plan Note (Signed)
Well-controlled, no change in plan. 

## 2024-05-08 NOTE — Assessment & Plan Note (Signed)
 Recent admission left nephrolithiasis with hydronephrosis, stent placed, scheduled for what sounds to be ureteroscopy. Follow-up with me as needed for this.

## 2024-05-08 NOTE — Assessment & Plan Note (Signed)
 Adding tadalafil 5

## 2024-05-08 NOTE — Telephone Encounter (Signed)
 Pharmacy Patient Advocate Encounter   Received notification from CoverMyMeds that prior authorization for Tadalafil 5MG  tablets is required/requested.   Insurance verification completed.   The patient is insured through [employer] .   Per test claim: PA required; PA submitted to above mentioned insurance via CoverMyMeds Key/confirmation #/EOC [id number] Status is pending

## 2024-05-08 NOTE — Progress Notes (Signed)
    Procedures performed today:    None.  Independent interpretation of notes and tests performed by another provider:   None.  Brief History, Exam, Impression, and Recommendations:    Diabetes mellitus type 2, controlled, without complications (HCC) Well-controlled, no change in plan  Erectile dysfunction Adding tadalafil 5  Nephrolithiasis Recent admission left nephrolithiasis with hydronephrosis, stent placed, scheduled for what sounds to be ureteroscopy. Follow-up with me as needed for this.    ____________________________________________ Joselyn Nicely. Sandy Crumb, M.D., ABFM., CAQSM., AME. Primary Care and Sports Medicine [facility]  Adjunct Professor of [facility] Medicine  [employer]  Restaurant manager, fast food

## 2024-05-09 ENCOUNTER — Other Ambulatory Visit (HOSPITAL_COMMUNITY): Payer: Self-pay

## 2024-05-09 NOTE — Telephone Encounter (Signed)
 Pharmacy Patient Advocate Encounter  Received notification from [employer] that Prior Authorization for Tadalafil 5MG  tablets  has been DENIED.  Full denial letter will be uploaded to the media tab. See denial reason below.   PA #/Case ID/Reference #: [id number]        PA denied under Medicare Part D but approved through planbenefit for 1 year. Ran test claim, came back refill too soon. Last filled 05/09/24, next available fill 05/31/24.

## 2024-05-22 ENCOUNTER — Other Ambulatory Visit: Payer: Self-pay | Admitting: Sports Medicine

## 2024-05-22 DIAGNOSIS — E119 Type 2 diabetes mellitus without complications: Secondary | ICD-10-CM

## 2024-05-26 DIAGNOSIS — N200 Calculus of kidney: Secondary | ICD-10-CM | POA: Diagnosis not present

## 2024-06-11 DIAGNOSIS — Z79899 Other long term (current) drug therapy: Secondary | ICD-10-CM | POA: Diagnosis not present

## 2024-06-11 DIAGNOSIS — N189 Chronic kidney disease, unspecified: Secondary | ICD-10-CM | POA: Diagnosis not present

## 2024-06-11 DIAGNOSIS — E1122 Type 2 diabetes mellitus with diabetic chronic kidney disease: Secondary | ICD-10-CM | POA: Diagnosis not present

## 2024-06-11 DIAGNOSIS — Z7984 Long term (current) use of oral hypoglycemic drugs: Secondary | ICD-10-CM | POA: Diagnosis not present

## 2024-06-11 DIAGNOSIS — E785 Hyperlipidemia, unspecified: Secondary | ICD-10-CM | POA: Diagnosis not present

## 2024-06-11 DIAGNOSIS — N132 Hydronephrosis with renal and ureteral calculous obstruction: Secondary | ICD-10-CM | POA: Diagnosis not present

## 2024-07-01 ENCOUNTER — Encounter (INDEPENDENT_AMBULATORY_CARE_PROVIDER_SITE_OTHER): Payer: Self-pay | Admitting: Sports Medicine

## 2024-07-01 DIAGNOSIS — N529 Male erectile dysfunction, unspecified: Secondary | ICD-10-CM

## 2024-07-01 MED ORDER — SILDENAFIL CITRATE 25 MG PO TABS: 25.0000 mg | ORAL_TABLET | Freq: Every day | ORAL | 0 refills | Status: DC | PRN

## 2024-07-01 NOTE — Telephone Encounter (Signed)

## 2024-07-10 ENCOUNTER — Encounter: Payer: Self-pay | Admitting: Sports Medicine

## 2024-07-10 DIAGNOSIS — H43313 Vitreous membranes and strands, bilateral: Secondary | ICD-10-CM | POA: Diagnosis not present

## 2024-07-10 DIAGNOSIS — H2513 Age-related nuclear cataract, bilateral: Secondary | ICD-10-CM | POA: Diagnosis not present

## 2024-07-10 DIAGNOSIS — E119 Type 2 diabetes mellitus without complications: Secondary | ICD-10-CM | POA: Diagnosis not present

## 2024-07-10 LAB — HM DIABETES EYE EXAM

## 2024-07-24 ENCOUNTER — Other Ambulatory Visit: Payer: Self-pay | Admitting: Sports Medicine

## 2024-07-24 DIAGNOSIS — N529 Male erectile dysfunction, unspecified: Secondary | ICD-10-CM

## 2024-08-14 DIAGNOSIS — N200 Calculus of kidney: Secondary | ICD-10-CM | POA: Diagnosis not present

## 2024-08-15 DIAGNOSIS — R3915 Urgency of urination: Secondary | ICD-10-CM | POA: Insufficient documentation

## 2024-08-15 DIAGNOSIS — R350 Frequency of micturition: Secondary | ICD-10-CM | POA: Insufficient documentation

## 2024-08-21 ENCOUNTER — Telehealth: Payer: Self-pay | Admitting: Family Medicine

## 2024-08-21 NOTE — Telephone Encounter (Signed)
 Copied from CRM #[id number]. Topic: General - Other >> Aug 21, 2024 11:29 AM Alfonso ORN wrote: Reason for CRM: Jerelene Eddy calling back  reason patient  received a call on 06/20/24 from [facility] regarding Dr. ONEIDA leaving practice Patient requesting if can be establish with Velma Cough , prefer a male provider

## 2024-08-26 ENCOUNTER — Encounter: Payer: Self-pay | Admitting: Sports Medicine

## 2024-08-26 NOTE — Telephone Encounter (Signed)
 I contacted patient he is scheduled

## 2024-09-17 ENCOUNTER — Encounter: Payer: Self-pay | Admitting: Family Medicine

## 2024-09-17 DIAGNOSIS — E119 Type 2 diabetes mellitus without complications: Secondary | ICD-10-CM

## 2024-09-17 MED ORDER — ROSUVASTATIN CALCIUM 40 MG PO TABS: 40.0000 mg | ORAL_TABLET | Freq: Every day | ORAL | 0 refills | Status: DC

## 2024-09-17 MED ORDER — LISINOPRIL 2.5 MG PO TABS: 2.5000 mg | ORAL_TABLET | Freq: Every day | ORAL | 0 refills | Status: DC

## 2024-10-22 ENCOUNTER — Encounter: Payer: Self-pay | Admitting: Family Medicine

## 2024-10-22 DIAGNOSIS — E119 Type 2 diabetes mellitus without complications: Secondary | ICD-10-CM

## 2024-10-22 MED ORDER — METFORMIN HCL 1000 MG PO TABS: 1000.0000 mg | ORAL_TABLET | Freq: Two times a day (BID) | ORAL | 0 refills | Status: DC

## 2024-10-24 NOTE — Progress Notes (Signed)
 William Webster                                          MRN: [id number]   10/24/2024   The VBCI Quality Team Specialist reviewed this patient medical record for the purposes of chart review for care gap closure. The following were reviewed: chart review for care gap closure-kidney health evaluation for diabetes:eGFR  and uACR.    VBCI Quality Team

## 2024-11-06 ENCOUNTER — Ambulatory Visit (INDEPENDENT_AMBULATORY_CARE_PROVIDER_SITE_OTHER): Payer: Medicare (Managed Care) | Admitting: Family Medicine

## 2024-11-06 ENCOUNTER — Encounter: Payer: Self-pay | Admitting: Family Medicine

## 2024-11-06 ENCOUNTER — Ambulatory Visit: Payer: Medicare (Managed Care) | Admitting: Sports Medicine

## 2024-11-06 VITALS — BP 118/72 | HR 66 | Ht 72.0 in | Wt 226.0 lb

## 2024-11-06 DIAGNOSIS — E785 Hyperlipidemia, unspecified: Secondary | ICD-10-CM | POA: Diagnosis not present

## 2024-11-06 DIAGNOSIS — N529 Male erectile dysfunction, unspecified: Secondary | ICD-10-CM

## 2024-11-06 DIAGNOSIS — E1169 Type 2 diabetes mellitus with other specified complication: Secondary | ICD-10-CM | POA: Diagnosis not present

## 2024-11-06 DIAGNOSIS — E782 Mixed hyperlipidemia: Secondary | ICD-10-CM

## 2024-11-06 DIAGNOSIS — N401 Enlarged prostate with lower urinary tract symptoms: Secondary | ICD-10-CM | POA: Insufficient documentation

## 2024-11-06 DIAGNOSIS — E119 Type 2 diabetes mellitus without complications: Secondary | ICD-10-CM

## 2024-11-06 DIAGNOSIS — R350 Frequency of micturition: Secondary | ICD-10-CM

## 2024-11-06 DIAGNOSIS — E221 Hyperprolactinemia: Secondary | ICD-10-CM

## 2024-11-06 DIAGNOSIS — Z23 Encounter for immunization: Secondary | ICD-10-CM

## 2024-11-06 LAB — POCT GLYCOSYLATED HEMOGLOBIN (HGB A1C): HbA1c, POC (controlled diabetic range): 6.9 % (ref 0.0–7.0)

## 2024-11-06 MED ORDER — CABERGOLINE 0.5 MG PO TABS: 0.5000 mg | ORAL_TABLET | ORAL | 2 refills | Status: DC

## 2024-11-06 MED ORDER — SILDENAFIL CITRATE 25 MG PO TABS: 25.0000 mg | ORAL_TABLET | Freq: Every day | ORAL | 3 refills | Status: AC | PRN

## 2024-11-06 MED ORDER — ROSUVASTATIN CALCIUM 40 MG PO TABS: 40.0000 mg | ORAL_TABLET | Freq: Every day | ORAL | 2 refills | Status: AC

## 2024-11-06 MED ORDER — CABERGOLINE 0.5 MG PO TABS
0.5000 mg | ORAL_TABLET | ORAL | 2 refills | Status: AC
Start: 2024-11-06 — End: ?

## 2024-11-06 MED ORDER — LISINOPRIL 2.5 MG PO TABS: 2.5000 mg | ORAL_TABLET | Freq: Every day | ORAL | 2 refills | Status: AC

## 2024-11-06 MED ORDER — LISINOPRIL 2.5 MG PO TABS: 2.5000 mg | ORAL_TABLET | Freq: Every day | ORAL | 0 refills | Status: DC

## 2024-11-06 MED ORDER — ROSUVASTATIN CALCIUM 40 MG PO TABS: 40.0000 mg | ORAL_TABLET | Freq: Every day | ORAL | 0 refills | Status: DC

## 2024-11-06 NOTE — Assessment & Plan Note (Signed)
 Rechecking prolactin levels.  Continue cabergoline .

## 2024-11-06 NOTE — Assessment & Plan Note (Signed)
Update PSA 

## 2024-11-06 NOTE — Assessment & Plan Note (Addendum)
 Diabetes remains well-controlled.  He will continue Trulicity  and glipizide .  He prefers to drop metformin  and we can do a trial off of this over the next few months.  Continue Crestor  at current strength for associated hyperlipidemia

## 2024-11-06 NOTE — Assessment & Plan Note (Signed)
 Continue sildenafil  at current strength.

## 2024-11-06 NOTE — Progress Notes (Signed)
 William Webster - 73 y.o. male MRN [id number]  Date of birth: September 09, 1951  Subjective Chief Complaint  Patient presents with   Diabetes   Hypertension    HPI William Webster is a 73 y.o. male here today for follow up.   He is a former patient of Dr. Curtis.    History of diabetes that is managed with metformin , glipizide  and trulicity .  A1c has been relatively stable and today is 6.9%.  Tolerating Crestor  well for associated hyperlipidemia.  Blood pressure is well controlled with lisinopril  at current strength.  Denies side effects at current strength.  He has not had chest pain, shortness of breath, palpitations, headache or vision changes.    History of prolactinoma.  He is on cabergoline  and doing well with this at current strength.  ROS:  A comprehensive ROS was completed and negative except as noted per HPI  No Known Allergies  Past Medical History:  Diagnosis Date   Anterior pituitary adenoma syndrome (HCC)    Bimalleolar fracture of right ankle    Cancer (HCC) 01/2022   Right neck melanoma   Colon polyp    Diabetes mellitus without complication (HCC)    Diabetic neuropathy (HCC)    Family history of cancer of GI tract    Family history of colon cancer    GERD (gastroesophageal reflux disease)    Gilbert disease    Hyperlipidemia    Low back pain    Lumbar scoliosis    Prolactinoma (HCC)    Sciatica of right side    Spondylosis of lumbar joint     Past Surgical History:  Procedure Laterality Date   COLONOSCOPY  2012   [place]   ESOPHAGOGASTRODUODENOSCOPY     With [facility] he had a bad case of reflux-said it been a few years. Maybe about 10 years or 11 years    FRACTURE SURGERY  2018   HEMORRHOID SURGERY     MASS EXCISION Right 03/08/2022   Procedure: REEXCISION RIGHT NECK MELANOMA;  Surgeon: Aron Shoulders, MD;  Location: [facility] OR;  Service: General;  Laterality: Right;   MELANOMA EXCISION Right 02/13/2022   Procedure: WIDE LOCAL EXCISION  WITH ADVANCE FLAP CLOSURE FOR MELANOMA RIGHT NECK;  Surgeon: Aron Shoulders, MD;  Location: [facility];  Service: General;  Laterality: Right;   ORIF ANKLE FRACTURE Right 11/24/2015   Procedure: OPEN REDUCTION INTERNAL FIXATION (ORIF) ANKLE FRACTURE;  Surgeon: Norleen Gavel, MD;  Location: [facility];  Service: Orthopedics;  Laterality: Right;  Open reduction internal fixation right ankle fracture   POLYPECTOMY     TONSILLECTOMY     UPPER GASTROINTESTINAL ENDOSCOPY      Social History   Socioeconomic History   Marital status: Married    Spouse name: [name]   Number of children: 2   Years of education: 12th grade   Highest education level: 12th grade  Occupational History    Comment: Part-time   Occupation: Retired  Tobacco Use   Smoking status: Former    Types: Cigars    Quit date: 03/2020    Years since quitting: 4.6    Passive exposure: Past   Smokeless tobacco: Never   Tobacco comments:    occassional cigar-every month stopped several years ago (03/2020)  Vaping Use   Vaping status: Never Used  Substance and Sexual Activity   Alcohol use: Not Currently    Comment: socially   Drug use: No   Sexual activity: Yes    Birth control/protection: None  Other Topics Concern   Not on file  Social History Narrative   Retired. Lives with wife. He has two daughters (one lives in [place] and one in [place] ). Enjoys going to the car shows.    Social Drivers of Health   Financial Resource Strain: Patient Declined (11/06/2024)   Overall Financial Resource Strain (CARDIA)    Difficulty of Paying Living Expenses: Patient declined  Food Insecurity: Patient Declined (11/06/2024)   Hunger Vital Sign    Worried About Running Out of Food in the Last Year: Patient declined    Ran Out of Food in the Last Year: Patient declined  Transportation Needs: Patient Declined (11/06/2024)   PRAPARE - Administrator, Civil Service (Medical): Patient  declined    Lack of Transportation (Non-Medical): Patient declined  Physical Activity: Unknown (11/06/2024)   Exercise Vital Sign    Days of Exercise per Week: Patient declined    Minutes of Exercise per Session: Not on file  Stress: No Stress Concern Present (11/06/2024)   [employer] of Occupational Health - Occupational Stress Questionnaire    Feeling of Stress: Not at all  Social Connections: Unknown (11/06/2024)   Social Connection and Isolation Panel    Frequency of Communication with Friends and Family: Patient declined    Frequency of Social Gatherings with Friends and Family: Patient declined    Attends Religious Services: Patient declined    Database Administrator or Organizations: No    Attends Engineer, Structural: Not on file    Marital Status: Married    Family History  Problem Relation Age of Onset   Colon polyps Mother    Heart failure Mother    Diabetes Mother    Hyperlipidemia Mother    Stroke Mother    Colon cancer Mother        EARLY 60'S   Arthritis Mother    Cancer Mother    Heart disease Mother    Stroke Father    Hyperlipidemia Father    Cancer Father    Diabetes Maternal Grandfather    Heart disease Maternal Grandfather    Stroke Maternal Grandfather    Diabetes Paternal Grandfather    Heart disease Paternal Grandfather    Stroke Paternal Grandfather    Esophageal cancer Neg Hx    Stomach cancer Neg Hx    Rectal cancer Neg Hx     Health Maintenance  Topic Date Due   Diabetic kidney evaluation - eGFR measurement  11/08/2024   Diabetic kidney evaluation - Urine ACR  11/08/2024   COVID-19 Vaccine (6 - Pfizer risk 2025-26 season) 04/13/2025   Medicare Annual Wellness (AWV)  04/15/2025   HEMOGLOBIN A1C  05/06/2025   OPHTHALMOLOGY EXAM  07/10/2025   FOOT EXAM  11/06/2025   Colonoscopy  07/28/2027   DTaP/Tdap/Td (3 - Td or Tdap) 08/08/2033   Pneumococcal Vaccine: 50+ Years  Completed   Influenza Vaccine  Completed   Hepatitis  C Screening  Completed   Zoster Vaccines- Shingrix  Completed   Meningococcal B Vaccine  Aged Out   Fecal DNA (Cologuard)  Discontinued     ----------------------------------------------------------------------------------------------------------------------------------------------------------------------------------------------------------------- Physical Exam BP 118/72 (BP Location: Left Arm, Patient Position: Sitting, Cuff Size: Normal)   Pulse 66   Ht 6' (1.829 m)   Wt 226 lb (102.5 kg)   SpO2 99%   BMI 30.65 kg/m   Physical Exam Constitutional:      Appearance: Normal appearance.  HENT:     Head: Normocephalic and  atraumatic.  Eyes:     General: No scleral icterus. Cardiovascular:     Rate and Rhythm: Normal rate and regular rhythm.  Pulmonary:     Effort: Pulmonary effort is normal.     Breath sounds: Normal breath sounds.  Neurological:     Mental Status: He is alert.  Psychiatric:        Mood and Affect: Mood normal.        Behavior: Behavior normal.     ------------------------------------------------------------------------------------------------------------------------------------------------------------------------------------------------------------------- Assessment and Plan  Hyperlipidemia associated with type 2 diabetes mellitus (HCC) Diabetes remains well-controlled.  He will continue Trulicity  and glipizide .  He prefers to drop metformin  and we can do a trial off of this over the next few months.  Continue Crestor  at current strength for associated hyperlipidemia  Hyperprolactinemia Rechecking prolactin levels.  Continue cabergoline .  Erectile dysfunction Continue sildenafil  at current strength.  Benign prostatic hyperplasia with urinary frequency Update PSA.   Meds ordered this encounter  Medications   DISCONTD: cabergoline  (DOSTINEX ) 0.5 MG tablet    Sig: Take 1 tablet (0.5 mg total) by mouth 2 (two) times a week. Fridays & Mondays     Dispense:  24 tablet    Refill:  2   DISCONTD: lisinopril  (ZESTRIL ) 2.5 MG tablet    Sig: Take 1 tablet (2.5 mg total) by mouth daily.    Dispense:  90 tablet    Refill:  0   DISCONTD: rosuvastatin  (CRESTOR ) 40 MG tablet    Sig: Take 1 tablet (40 mg total) by mouth daily. Need labs    Dispense:  90 tablet    Refill:  0   sildenafil  (VIAGRA ) 25 MG tablet    Sig: Take 1-4 tablets (25-100 mg total) by mouth daily as needed for erectile dysfunction.    Dispense:  30 tablet    Refill:  3   rosuvastatin  (CRESTOR ) 40 MG tablet    Sig: Take 1 tablet (40 mg total) by mouth daily. Need labs    Dispense:  90 tablet    Refill:  2   cabergoline  (DOSTINEX ) 0.5 MG tablet    Sig: Take 1 tablet (0.5 mg total) by mouth 2 (two) times a week. Fridays & Mondays    Dispense:  24 tablet    Refill:  2   lisinopril  (ZESTRIL ) 2.5 MG tablet    Sig: Take 1 tablet (2.5 mg total) by mouth daily.    Dispense:  90 tablet    Refill:  2    Return in about 6 months (around 05/06/2025) for Hypertension, Type 2 Diabetes.

## 2024-11-07 LAB — LIPID PANEL WITH LDL/HDL RATIO
Cholesterol, Total: 126 mg/dL (ref 100–199)
HDL: 52 mg/dL (ref >39)
LDL Chol Calc (NIH): 49 mg/dL (ref 0–99)
LDL/HDL Ratio: 0.9 ratio (ref 0.0–3.6)
Triglycerides: 147 mg/dL (ref 0–149)
VLDL Cholesterol Cal: 25 mg/dL (ref 5–40)

## 2024-11-07 LAB — CMP14+EGFR
ALT: 18 IU/L (ref 0–44)
AST: 19 IU/L (ref 0–40)
Albumin: 4.7 g/dL (ref 3.8–4.8)
Alkaline Phosphatase: 54 IU/L (ref 47–123)
BUN/Creatinine Ratio: 14 (ref 10–24)
BUN: 17 mg/dL (ref 8–27)
Bilirubin Total: 1.5 mg/dL — ABNORMAL HIGH (ref 0.0–1.2)
CO2: 23 mmol/L (ref 20–29)
Calcium: 9.7 mg/dL (ref 8.6–10.2)
Chloride: 101 mmol/L (ref 96–106)
Creatinine, Ser: 1.21 mg/dL (ref 0.76–1.27)
Globulin, Total: 1.9 g/dL (ref 1.5–4.5)
Glucose: 147 mg/dL — ABNORMAL HIGH (ref 70–99)
Potassium: 4.4 mmol/L (ref 3.5–5.2)
Sodium: 141 mmol/L (ref 134–144)
Total Protein: 6.6 g/dL (ref 6.0–8.5)
eGFR: 64 mL/min/1.73 (ref >59)

## 2024-11-07 LAB — PROLACTIN: Prolactin: 16.7 ng/mL (ref 3.6–25.2)

## 2024-11-07 LAB — CBC WITH DIFFERENTIAL/PLATELET
Basophils Absolute: 0 x10E3/uL (ref 0.0–0.2)
Basos: 1 %
EOS (ABSOLUTE): 0.1 x10E3/uL (ref 0.0–0.4)
Eos: 2 %
Hematocrit: 43 % (ref 37.5–51.0)
Hemoglobin: 14 g/dL (ref 13.0–17.7)
Immature Grans (Abs): 0 x10E3/uL (ref 0.0–0.1)
Immature Granulocytes: 0 %
Lymphocytes Absolute: 1.5 x10E3/uL (ref 0.7–3.1)
Lymphs: 22 %
MCH: 28.9 pg (ref 26.6–33.0)
MCHC: 32.6 g/dL (ref 31.5–35.7)
MCV: 89 fL (ref 79–97)
Monocytes Absolute: 0.5 x10E3/uL (ref 0.1–0.9)
Monocytes: 7 %
Neutrophils Absolute: 4.5 x10E3/uL (ref 1.4–7.0)
Neutrophils: 68 %
Platelets: 129 x10E3/uL — ABNORMAL LOW (ref 150–450)
RBC: 4.85 x10E6/uL (ref 4.14–5.80)
RDW: 13.2 % (ref 11.6–15.4)
WBC: 6.6 x10E3/uL (ref 3.4–10.8)

## 2024-11-07 LAB — MICROALBUMIN / CREATININE URINE RATIO
Creatinine, Urine: 179.6 mg/dL
Microalb/Creat Ratio: 34 mg/g{creat} — ABNORMAL HIGH (ref 0–29)
Microalbumin, Urine: 61.9 ug/mL

## 2024-11-07 LAB — PSA: Prostate Specific Ag, Serum: 4.6 ng/mL — ABNORMAL HIGH (ref 0.0–4.0)

## 2024-11-07 LAB — TSH: TSH: 1.66 u[IU]/mL (ref 0.450–4.500)

## 2024-11-19 ENCOUNTER — Other Ambulatory Visit: Payer: Self-pay | Admitting: Urgent Care

## 2024-11-19 DIAGNOSIS — E119 Type 2 diabetes mellitus without complications: Secondary | ICD-10-CM

## 2024-11-19 NOTE — Telephone Encounter (Signed)
 According to Dr. Alvia last note - pt wanted to stop meformin and see if he could control it with his other meds/ diet. Did he change his mind? The plan was to DC metformin  for three months

## 2024-11-23 ENCOUNTER — Ambulatory Visit: Payer: Self-pay | Admitting: Family Medicine

## 2024-11-23 DIAGNOSIS — R972 Elevated prostate specific antigen [PSA]: Secondary | ICD-10-CM

## 2024-11-25 NOTE — Telephone Encounter (Signed)
 Called patient pharmacy - informed to hold prescription for several months until patient seen for re-evaluation by provider . Was told by pharmacy tech that the prescription did not have any refills currently so not needed to place on hold.

## 2024-11-25 NOTE — Telephone Encounter (Signed)
 Spoke with patient will stop by office to have lab work drawn at his convenience.

## 2024-11-25 NOTE — Telephone Encounter (Signed)
 Spoke to patient. States he did not request this refill . This was an automated refill request from pharmacy . He has already  told the pharmacist to d/c this but keeps getting the refill request - I will attempt to call pharmacy and put this on hold  until hears further from provider - they are not yet open this morning will call again at later time.

## 2024-12-11 LAB — PSA, TOTAL AND FREE
PSA, Free Pct: 38.6 %
PSA, Free: 1.62 ng/mL
Prostate Specific Ag, Serum: 4.2 ng/mL — ABNORMAL HIGH (ref 0.0–4.0)

## 2024-12-18 NOTE — Addendum Note (Signed)
 Addended by: Gabriellah Rabel E on: 12/18/2024 03:03 PM   Modules accepted: Orders

## 2025-04-16 ENCOUNTER — Ambulatory Visit: Payer: Medicare (Managed Care)

## 2025-05-07 ENCOUNTER — Ambulatory Visit: Payer: Medicare (Managed Care) | Admitting: Family Medicine
